# Patient Record
Sex: Male | Born: 1955
Health system: Southern US, Community
[De-identification: ages and names within clinical notes are randomized; demographics above are authoritative.]

## PROBLEM LIST (undated history)

## (undated) DIAGNOSIS — E0590 Thyrotoxicosis, unspecified without thyrotoxic crisis or storm: Secondary | ICD-10-CM

## (undated) HISTORY — PX: FINGER SURGERY: SHX640

## (undated) HISTORY — DX: Thyrotoxicosis, unspecified without thyrotoxic crisis or storm: E05.90

---

## 1988-12-02 HISTORY — PX: BACK SURGERY: SHX140

## 2015-10-23 ENCOUNTER — Other Ambulatory Visit (HOSPITAL_COMMUNITY): Payer: Self-pay | Admitting: Family Medicine

## 2015-10-23 DIAGNOSIS — E0590 Thyrotoxicosis, unspecified without thyrotoxic crisis or storm: Secondary | ICD-10-CM

## 2015-11-01 ENCOUNTER — Encounter (HOSPITAL_COMMUNITY)
Admission: RE | Admit: 2015-11-01 | Discharge: 2015-11-01 | Disposition: A | Discharge status: Home / Self Care | Payer: BLUE CROSS/BLUE SHIELD | Source: Ambulatory Visit | Attending: Family Medicine | Admitting: Family Medicine

## 2015-11-01 DIAGNOSIS — E0590 Thyrotoxicosis, unspecified without thyrotoxic crisis or storm: Secondary | ICD-10-CM

## 2015-11-02 ENCOUNTER — Encounter (HOSPITAL_COMMUNITY)
Admission: RE | Admit: 2015-11-02 | Discharge: 2015-11-02 | Disposition: A | Discharge status: Home / Self Care | Payer: BLUE CROSS/BLUE SHIELD | Source: Ambulatory Visit | Attending: Family Medicine | Admitting: Family Medicine

## 2015-11-02 DIAGNOSIS — E0590 Thyrotoxicosis, unspecified without thyrotoxic crisis or storm: Secondary | ICD-10-CM | POA: Diagnosis present

## 2015-11-02 MED ORDER — SODIUM PERTECHNETATE TC 99M INJECTION
11.0000 | Freq: Once | INTRAVENOUS | Status: AC | PRN
  Administered 2015-11-02: 11 via INTRAVENOUS

## 2015-11-21 ENCOUNTER — Ambulatory Visit (INDEPENDENT_AMBULATORY_CARE_PROVIDER_SITE_OTHER): Payer: BLUE CROSS/BLUE SHIELD | Admitting: Endocrinology

## 2015-11-21 ENCOUNTER — Encounter: Payer: Self-pay | Admitting: Endocrinology

## 2015-11-21 VITALS — BP 170/94 | HR 73 | Temp 98.0°F | Resp 14 | Ht 71.0 in | Wt 216.4 lb

## 2015-11-21 DIAGNOSIS — I10 Essential (primary) hypertension: Secondary | ICD-10-CM | POA: Diagnosis not present

## 2015-11-21 DIAGNOSIS — H9193 Unspecified hearing loss, bilateral: Secondary | ICD-10-CM | POA: Diagnosis not present

## 2015-11-21 DIAGNOSIS — E0590 Thyrotoxicosis, unspecified without thyrotoxic crisis or storm: Secondary | ICD-10-CM

## 2015-11-21 DIAGNOSIS — H9319 Tinnitus, unspecified ear: Secondary | ICD-10-CM

## 2015-11-21 NOTE — Patient Instructions (Signed)
I have ordered for you a treatment pill of radioactive iodine.  Although it is a larger amount of radiation, you will again notice no symptoms from this.  The pill is gone from your body in a few days (during which you should stay away from other people), but takes several months to work.  Therefore, please return here approximately 6-8 weeks after the treatment.  This treatment has been available for many years, and the only known side-effect is an underactive thyroid.  It is possible that i would eventually prescribe for you a thyroid hormone pill, which is very inexpensive.  You don't have to worry about side-effects of this thyroid hormone pill, because it is the same molecule your thyroid makes.   

## 2015-11-21 NOTE — Progress Notes (Signed)
Subjective:    Patient ID: Ivan Lawson, male    DOB: Mar 31, 1956, 59 y.o.   MRN: [id number]  HPI Pt states few mos of slight tremor of the hands, but no assoc weight loss.  he has no prior thyroid hx.  He has never had XRT to the anterior neck, or thyroid surgery.  He has never had thyroid imaging.  He does not consume kelp or any other prescribed or non-prescribed thyroid medication.  He has never been on amiodarone.    No past medical history on file.  No past surgical history on file.  Social History   Social History  . Marital Status: Married    Spouse Name: N/A  . Number of Children: N/A  . Years of Education: N/A   Occupational History  . Not on file.   Social History Main Topics  . Smoking status: Never Smoker   . Smokeless tobacco: Never Used  . Alcohol Use: Not on file  . Drug Use: Not on file  . Sexual Activity: Not on file   Other Topics Concern  . Not on file   Social History Narrative  . No narrative on file    No current outpatient prescriptions on file prior to visit.   No current facility-administered medications on file prior to visit.    No Known Allergies  Family History  Problem Relation Age of Onset  . Thyroid disease Neg Hx     BP 170/94 mmHg  Pulse 73  Temp(Src) 98 F (36.7 C)  Resp 14  Ht 5\' 11"  (1.803 m)  Wt 216 lb 6.4 oz (98.158 kg)  BMI 30.19 kg/m2  SpO2 97%   Review of Systems denies fever, headache, hoarseness, visual loss, palpitations, sob, diarrhea, polyuria, muscle weakness, anxiety, heat intolerance, easy bruising, and rhinorrhea.  He has increased diaphoresis recently.  He has slight acral numbness and tingling.     Objective:   Physical Exam VS: see vs page GEN: no distress HEAD: head: no deformity eyes: no periorbital swelling, no proptosis external nose and ears are normal mouth: no lesion seen NECK: supple, thyroid is not enlarged CHEST WALL: no deformity LUNGS: clear to auscultation BREASTS:  No  gynecomastia CV: reg rate and rhythm, no murmur ABD: abdomen is soft, nontender.  no hepatosplenomegaly.  not distended.  no hernia MUSCULOSKELETAL: muscle bulk and strength are grossly normal.  no obvious joint swelling.  gait is normal and steady EXTEMITIES: no deformity.  no edema PULSES: no carotid bruit NEURO:  cn 2-12 grossly intact.   readily moves all 4's.  sensation is intact to touch on all 4's SKIN:  Normal texture and temperature.  No rash or suspicious lesion is visible.   NODES:  None palpable at the neck PSYCH: alert, well-oriented.  Does not appear anxious nor depressed.  outside test results are reviewed: TSH is <0.01 Free T4=1.28  Radiol: thyroid nuc med: normal, with diffuse uptake.  I have reviewed outside records, and summarized: Pt was noted to have hyperthyroidism, and referred here.     Assessment & Plan:  Hyperthyroidism, new, due to Grave's Dz. We discussed rx options.  She chooses I-131 rx.    Patient is advised the following: Patient Instructions  I have ordered for you a treatment pill of radioactive iodine.  Although it is a larger amount of radiation, you will again notice no symptoms from this.  The pill is gone from your body in a few days (during which you should stay  away from other people), but takes several months to work.  Therefore, please return here approximately 6-8 weeks after the treatment.  This treatment has been available for many years, and the only known side-effect is an underactive thyroid.  It is possible that i would eventually prescribe for you a thyroid hormone pill, which is very inexpensive.  You don't have to worry about side-effects of this thyroid hormone pill, because it is the same molecule your thyroid makes.

## 2015-11-22 DIAGNOSIS — H9319 Tinnitus, unspecified ear: Secondary | ICD-10-CM | POA: Insufficient documentation

## 2015-11-22 DIAGNOSIS — H9193 Unspecified hearing loss, bilateral: Secondary | ICD-10-CM | POA: Insufficient documentation

## 2015-11-22 DIAGNOSIS — I10 Essential (primary) hypertension: Secondary | ICD-10-CM | POA: Insufficient documentation

## 2015-11-23 ENCOUNTER — Encounter: Payer: Self-pay | Admitting: Endocrinology

## 2015-12-14 ENCOUNTER — Encounter (HOSPITAL_COMMUNITY)
Admission: RE | Admit: 2015-12-14 | Discharge: 2015-12-14 | Disposition: A | Discharge status: Home / Self Care | Payer: BLUE CROSS/BLUE SHIELD | Source: Ambulatory Visit | Attending: Endocrinology | Admitting: Endocrinology

## 2015-12-14 DIAGNOSIS — E0590 Thyrotoxicosis, unspecified without thyrotoxic crisis or storm: Secondary | ICD-10-CM | POA: Insufficient documentation

## 2015-12-14 MED ORDER — SODIUM IODIDE I 131 CAPSULE
33.9000 | Freq: Once | INTRAVENOUS | Status: AC | PRN
  Administered 2015-12-14: 33.9 via ORAL

## 2016-01-26 ENCOUNTER — Encounter: Payer: Self-pay | Admitting: Endocrinology

## 2016-01-26 ENCOUNTER — Ambulatory Visit (INDEPENDENT_AMBULATORY_CARE_PROVIDER_SITE_OTHER): Payer: BLUE CROSS/BLUE SHIELD | Admitting: Endocrinology

## 2016-01-26 VITALS — BP 156/82 | HR 64 | Temp 98.4°F | Ht 71.0 in | Wt 214.8 lb

## 2016-01-26 DIAGNOSIS — E0590 Thyrotoxicosis, unspecified without thyrotoxic crisis or storm: Secondary | ICD-10-CM | POA: Diagnosis not present

## 2016-01-26 LAB — TSH: TSH: 0.1 u[IU]/mL — AB (ref 0.35–4.50)

## 2016-01-26 LAB — T4, FREE: FREE T4: 2.88 ng/dL — AB (ref 0.60–1.60)

## 2016-01-26 NOTE — Progress Notes (Signed)
Pre visit review using our clinic review tool, if applicable. No additional management support is needed unless otherwise documented below in the visit note. 

## 2016-01-26 NOTE — Patient Instructions (Addendum)
blood tests are requested for you today.  We'll let you know about the results.  Please come back for a follow-up appointment in 1 month.  

## 2016-01-26 NOTE — Progress Notes (Signed)
   Subjective:    Patient ID: Ivan Lawson, male    DOB: May 29, 1956, 60 y.o.   MRN: [id number]  HPI Pt returns for f/u of hyperthyroidism (due to Grave's dz; dx'ed 2016; he had I-131 rx in Jan of 2017).  Since the I-131 rx, tremor is improved, but persists.  No past medical history on file.  No past surgical history on file.  Social History   Social History  . Marital Status: Married    Spouse Name: N/A  . Number of Children: N/A  . Years of Education: N/A   Occupational History  . Not on file.   Social History Main Topics  . Smoking status: Never Smoker   . Smokeless tobacco: Never Used  . Alcohol Use: Not on file  . Drug Use: Not on file  . Sexual Activity: Not on file   Other Topics Concern  . Not on file   Social History Narrative    Current Outpatient Prescriptions on File Prior to Visit  Medication Sig Dispense Refill  . amLODipine (NORVASC) 10 MG tablet   3  . bisoprolol-hydrochlorothiazide (ZIAC) 5-6.25 MG tablet   0   No current facility-administered medications on file prior to visit.    No Known Allergies  Family History  Problem Relation Age of Onset  . Thyroid disease Neg Hx     BP 156/82 mmHg  Pulse 64  Temp(Src) 98.4 F (36.9 C) (Oral)  Ht  (1.803 m)  Wt 214 lb 12.8 oz (97.433 kg)  BMI 29.97 kg/m2  SpO2 99%  Review of Systems Denies edema    Objective:   Physical Exam VITAL SIGNS:  See vs page GENERAL: no distress NECK: There is no palpable thyroid enlargement.  No thyroid nodule is palpable.  No palpable lymphadenopathy at the anterior neck.   Lab Results  Component Value Date   TSH 0.10* 01/26/2016       Assessment & Plan:  Hyperthyroidism: not better yet  Patient is advised the following: Patient Instructions  blood tests are requested for you today.  We'll let you know about the results. Please come back for a follow-up appointment in 1 month.

## 2016-01-29 ENCOUNTER — Telehealth: Payer: Self-pay | Admitting: Endocrinology

## 2016-01-29 NOTE — Telephone Encounter (Signed)
Pt advised of recent blood tests and voiced understanding.

## 2016-01-29 NOTE — Telephone Encounter (Signed)
Pt said he was calling for some lab results

## 2016-02-23 ENCOUNTER — Encounter: Payer: Self-pay | Admitting: Endocrinology

## 2016-02-23 ENCOUNTER — Ambulatory Visit (INDEPENDENT_AMBULATORY_CARE_PROVIDER_SITE_OTHER): Payer: BLUE CROSS/BLUE SHIELD | Admitting: Endocrinology

## 2016-02-23 VITALS — BP 140/86 | HR 71 | Temp 98.1°F | Ht 71.0 in | Wt 215.0 lb

## 2016-02-23 DIAGNOSIS — E0590 Thyrotoxicosis, unspecified without thyrotoxic crisis or storm: Secondary | ICD-10-CM | POA: Diagnosis not present

## 2016-02-23 LAB — TSH: TSH: 0.24 u[IU]/mL — ABNORMAL LOW (ref 0.35–4.50)

## 2016-02-23 LAB — T4, FREE: Free T4: 0.74 ng/dL (ref 0.60–1.60)

## 2016-02-23 MED ORDER — LEVOTHYROXINE SODIUM 100 MCG PO TABS: 100.0000 ug | ORAL_TABLET | Freq: Every day | ORAL | Status: DC

## 2016-02-23 NOTE — Patient Instructions (Signed)
blood tests are requested for you today.  We'll let you know about the results.  Please come back for a follow-up appointment in 1 month.  

## 2016-02-23 NOTE — Progress Notes (Signed)
   Subjective:    Patient ID: Ivan Lawson, male    DOB: 06-04-1956, 60 y.o.   MRN: [id number]  HPI Pt returns for f/u of hyperthyroidism (due to Grave's dz; dx'ed 2016; he had I-131 rx in Jan of 2017).  Since the I-131 rx, he says he can't tell if he feels better, due to sciatica.   No past medical history on file.  No past surgical history on file.  Social History   Social History  . Marital Status: Married    Spouse Name: N/A  . Number of Children: N/A  . Years of Education: N/A   Occupational History  . Not on file.   Social History Main Topics  . Smoking status: Never Smoker   . Smokeless tobacco: Never Used  . Alcohol Use: Not on file  . Drug Use: Not on file  . Sexual Activity: Not on file   Other Topics Concern  . Not on file   Social History Narrative    Current Outpatient Prescriptions on File Prior to Visit  Medication Sig Dispense Refill  . amLODipine (NORVASC) 10 MG tablet   3  . bisoprolol-hydrochlorothiazide (ZIAC) 5-6.25 MG tablet   0   No current facility-administered medications on file prior to visit.    No Known Allergies  Family History  Problem Relation Age of Onset  . Thyroid disease Neg Hx     BP 140/86 mmHg  Pulse 71  Temp(Src) 98.1 F (36.7 C) (Oral)  Ht 5\' 11"  (1.803 m)  Wt 215 lb (97.523 kg)  BMI 30.00 kg/m2  SpO2 98%  Review of Systems He has insomnia    Objective:   Physical Exam VITAL SIGNS:  See vs page GENERAL: no distress NECK: There is no palpable thyroid enlargement.  No thyroid nodule is palpable.  No palpable lymphadenopathy at the anterior neck.    Lab Results  Component Value Date   TSH 0.24* 02/23/2016      Assessment & Plan:  Hyperthyroidism: better.  He'll develop hypothyroidism soon.   Patient is advised the following:  Addendum: i have sent a prescription to your pharmacy, for synthroid

## 2016-03-22 ENCOUNTER — Ambulatory Visit (INDEPENDENT_AMBULATORY_CARE_PROVIDER_SITE_OTHER): Payer: BLUE CROSS/BLUE SHIELD | Admitting: Endocrinology

## 2016-03-22 ENCOUNTER — Encounter: Payer: Self-pay | Admitting: Endocrinology

## 2016-03-22 VITALS — BP 130/88 | HR 67 | Temp 98.3°F | Ht 71.0 in | Wt 213.0 lb

## 2016-03-22 DIAGNOSIS — E0590 Thyrotoxicosis, unspecified without thyrotoxic crisis or storm: Secondary | ICD-10-CM

## 2016-03-22 DIAGNOSIS — R200 Anesthesia of skin: Secondary | ICD-10-CM

## 2016-03-22 LAB — TSH: TSH: 0.04 u[IU]/mL — AB (ref 0.35–4.50)

## 2016-03-22 LAB — VITAMIN B12: Vitamin B-12: 180 pg/mL — ABNORMAL LOW (ref 211–911)

## 2016-03-22 LAB — T4, FREE: FREE T4: 1.4 ng/dL (ref 0.60–1.60)

## 2016-03-22 NOTE — Patient Instructions (Addendum)
blood tests are requested for you today.  We'll let you know about the results.  Please come back for a follow-up appointment in 6 weeks.  

## 2016-03-22 NOTE — Progress Notes (Signed)
   Subjective:    Patient ID: Ivan Lawson, male    DOB: 11-03-1956, 60 y.o.   MRN: [id number]  HPI Pt returns for f/u of hyperthyroidism (due to Grave's dz; dx'ed 2016; he had I-131 rx in Jan of 2017).  Since on the synthroid, pt states he feels well in general, except for slight numbness of the feet.   No past medical history on file.  No past surgical history on file.  Social History   Social History  . Marital Status: Married    Spouse Name: N/A  . Number of Children: N/A  . Years of Education: N/A   Occupational History  . Not on file.   Social History Main Topics  . Smoking status: Never Smoker   . Smokeless tobacco: Never Used  . Alcohol Use: Not on file  . Drug Use: Not on file  . Sexual Activity: Not on file   Other Topics Concern  . Not on file   Social History Narrative    Current Outpatient Prescriptions on File Prior to Visit  Medication Sig Dispense Refill  . amLODipine (NORVASC) 10 MG tablet   3  . bisoprolol-hydrochlorothiazide (ZIAC) 5-6.25 MG tablet   0   No current facility-administered medications on file prior to visit.    No Known Allergies  Family History  Problem Relation Age of Onset  . Thyroid disease Neg Hx     BP 130/88 mmHg  Pulse 67  Temp(Src) 98.3 F (36.8 C) (Oral)  Ht 5\' 11"  (1.803 m)  Wt 213 lb (96.616 kg)  BMI 29.72 kg/m2  SpO2 95%  Review of Systems No weight change    Objective:   Physical Exam VITAL SIGNS:  See vs page GENERAL: no distress NECK: There is no palpable thyroid enlargement.  No thyroid nodule is palpable.  No palpable lymphadenopathy at the anterior neck.  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  Patient is advised the following: Patient Instructions  blood tests are requested for you today.  We'll let you know about the results.  Please come back for a follow-up appointment in 6 weeks.     addendum: i  have sent a prescription to your pharmacy, to reduce the synthroid.

## 2016-03-23 ENCOUNTER — Encounter: Payer: Self-pay | Admitting: Endocrinology

## 2016-03-23 MED ORDER — LEVOTHYROXINE SODIUM 50 MCG PO TABS: 50.0000 ug | ORAL_TABLET | Freq: Every day | ORAL | Status: DC

## 2016-03-26 ENCOUNTER — Ambulatory Visit (INDEPENDENT_AMBULATORY_CARE_PROVIDER_SITE_OTHER): Payer: BLUE CROSS/BLUE SHIELD

## 2016-03-26 DIAGNOSIS — E538 Deficiency of other specified B group vitamins: Secondary | ICD-10-CM | POA: Diagnosis not present

## 2016-03-26 MED ORDER — CYANOCOBALAMIN 1000 MCG/ML IJ SOLN
1000.0000 ug | Freq: Once | INTRAMUSCULAR | Status: AC
  Administered 2016-03-26: 1000 ug via INTRAMUSCULAR

## 2016-04-25 ENCOUNTER — Ambulatory Visit (INDEPENDENT_AMBULATORY_CARE_PROVIDER_SITE_OTHER): Payer: BLUE CROSS/BLUE SHIELD | Admitting: *Deleted

## 2016-04-25 DIAGNOSIS — D531 Other megaloblastic anemias, not elsewhere classified: Secondary | ICD-10-CM | POA: Diagnosis not present

## 2016-04-25 MED ORDER — CYANOCOBALAMIN 1000 MCG/ML IJ SOLN: 1000.0000 ug | Freq: Once | INTRAMUSCULAR | Status: DC

## 2016-05-03 ENCOUNTER — Ambulatory Visit (INDEPENDENT_AMBULATORY_CARE_PROVIDER_SITE_OTHER): Payer: BLUE CROSS/BLUE SHIELD | Admitting: Endocrinology

## 2016-05-03 ENCOUNTER — Encounter: Payer: Self-pay | Admitting: Endocrinology

## 2016-05-03 VITALS — BP 144/86 | HR 63 | Temp 98.4°F | Ht 71.0 in | Wt 220.0 lb

## 2016-05-03 DIAGNOSIS — R972 Elevated prostate specific antigen [PSA]: Secondary | ICD-10-CM | POA: Diagnosis not present

## 2016-05-03 DIAGNOSIS — E0590 Thyrotoxicosis, unspecified without thyrotoxic crisis or storm: Secondary | ICD-10-CM

## 2016-05-03 LAB — TSH: TSH: 26.99 u[IU]/mL — AB (ref 0.35–4.50)

## 2016-05-03 LAB — T4, FREE: FREE T4: 0.41 ng/dL — AB (ref 0.60–1.60)

## 2016-05-03 NOTE — Patient Instructions (Addendum)
blood tests are requested for you today.  We'll let you know about the results.   Please come back for a follow-up appointment in 3 months.   

## 2016-05-03 NOTE — Progress Notes (Signed)
   Subjective:    Patient ID: Ivan Lawson, male    DOB: June 30, 1956, 60 y.o.   MRN: [id number]  HPI Pt returns for f/u of hyperthyroidism (due to Grave's dz; dx'ed 2016; he had I-131 rx in Jan of 2017).  Since on the synthroid, pt states he feels well in general.   Past Medical History  Diagnosis Date  . Hyperthyroidism     No past surgical history on file.  Social History   Social History  . Marital Status: Married    Spouse Name: N/A  . Number of Children: N/A  . Years of Education: N/A   Occupational History  . Not on file.   Social History Main Topics  . Smoking status: Never Smoker   . Smokeless tobacco: Never Used  . Alcohol Use: Not on file  . Drug Use: Not on file  . Sexual Activity: Not on file   Other Topics Concern  . Not on file   Social History Narrative    Current Outpatient Prescriptions on File Prior to Visit  Medication Sig Dispense Refill  . amLODipine (NORVASC) 10 MG tablet   3  . bisoprolol-hydrochlorothiazide (ZIAC) 5-6.25 MG tablet   0  . cyanocobalamin (,VITAMIN B-12,) 1000 MCG/ML injection Inject 1 mL (1,000 mcg total) into the muscle once. 1 mL 0  . levothyroxine (SYNTHROID, LEVOTHROID) 50 MCG tablet Take 1 tablet (50 mcg total) by mouth daily. 30 tablet 3   No current facility-administered medications on file prior to visit.    No Known Allergies  Family History  Problem Relation Age of Onset  . Thyroid disease Neg Hx     BP 144/86 mmHg  Pulse 63  Temp(Src) 98.4 F (36.9 C) (Oral)  Ht 5\' 11"  (1.803 m)  Wt 220 lb (99.791 kg)  BMI 30.70 kg/m2  SpO2 96%  Review of Systems He has gained weight.        Objective:   Physical Exam VITAL SIGNS:  See vs page.  GENERAL: no distress.  NECK: There is no palpable thyroid enlargement.  No thyroid nodule is palpable.  No palpable lymphadenopathy at the anterior neck.    Lab Results  Component Value Date   TSH 26.99* 05/03/2016      Assessment & Plan:  Post-RAI hypothyroidism:  she needs increased rx.  Increase synthroid to 112/d  Patient is advised the following: Patient Instructions  blood tests are requested for you today.  We'll let you know about the results.  Please come back for a follow-up appointment in 3 months.     Romero BellingELLISON, Tashan Kreitzer, MD

## 2016-05-04 MED ORDER — LEVOTHYROXINE SODIUM 112 MCG PO TABS: 112.0000 ug | ORAL_TABLET | Freq: Every day | ORAL | Status: DC

## 2016-05-28 ENCOUNTER — Ambulatory Visit (INDEPENDENT_AMBULATORY_CARE_PROVIDER_SITE_OTHER): Payer: BLUE CROSS/BLUE SHIELD

## 2016-05-28 DIAGNOSIS — E538 Deficiency of other specified B group vitamins: Secondary | ICD-10-CM | POA: Diagnosis not present

## 2016-05-28 MED ORDER — CYANOCOBALAMIN 1000 MCG/ML IJ SOLN
1000.0000 ug | Freq: Once | INTRAMUSCULAR | Status: AC
  Administered 2016-05-28: 1000 ug via INTRAMUSCULAR

## 2016-06-27 ENCOUNTER — Ambulatory Visit: Payer: BLUE CROSS/BLUE SHIELD

## 2016-07-01 DIAGNOSIS — R972 Elevated prostate specific antigen [PSA]: Secondary | ICD-10-CM | POA: Diagnosis not present

## 2016-07-31 ENCOUNTER — Ambulatory Visit: Payer: BLUE CROSS/BLUE SHIELD

## 2016-08-06 ENCOUNTER — Ambulatory Visit (INDEPENDENT_AMBULATORY_CARE_PROVIDER_SITE_OTHER): Payer: BLUE CROSS/BLUE SHIELD

## 2016-08-06 DIAGNOSIS — E538 Deficiency of other specified B group vitamins: Secondary | ICD-10-CM | POA: Diagnosis not present

## 2016-08-06 MED ORDER — CYANOCOBALAMIN 1000 MCG/ML IJ SOLN
1000.0000 ug | Freq: Once | INTRAMUSCULAR | Status: AC
  Administered 2016-08-06: 1000 ug via INTRAMUSCULAR

## 2016-08-07 ENCOUNTER — Ambulatory Visit: Payer: BLUE CROSS/BLUE SHIELD | Admitting: Endocrinology

## 2016-08-07 DIAGNOSIS — Z0289 Encounter for other administrative examinations: Secondary | ICD-10-CM

## 2016-09-05 ENCOUNTER — Ambulatory Visit (INDEPENDENT_AMBULATORY_CARE_PROVIDER_SITE_OTHER): Payer: BLUE CROSS/BLUE SHIELD

## 2016-09-05 DIAGNOSIS — E538 Deficiency of other specified B group vitamins: Secondary | ICD-10-CM

## 2016-09-05 MED ORDER — CYANOCOBALAMIN 1000 MCG/ML IJ SOLN
1000.0000 ug | Freq: Once | INTRAMUSCULAR | Status: AC
  Administered 2016-09-05: 1000 ug via INTRAMUSCULAR

## 2016-09-25 ENCOUNTER — Encounter: Payer: Self-pay | Admitting: Endocrinology

## 2016-09-25 ENCOUNTER — Ambulatory Visit (INDEPENDENT_AMBULATORY_CARE_PROVIDER_SITE_OTHER): Payer: BLUE CROSS/BLUE SHIELD | Admitting: Endocrinology

## 2016-09-25 DIAGNOSIS — E890 Postprocedural hypothyroidism: Secondary | ICD-10-CM | POA: Diagnosis not present

## 2016-09-25 LAB — TSH: TSH: 5.6 u[IU]/mL — AB (ref 0.35–4.50)

## 2016-09-25 MED ORDER — LEVOTHYROXINE SODIUM 125 MCG PO TABS: 125.0000 ug | ORAL_TABLET | Freq: Every day | ORAL | 2 refills | Status: DC

## 2016-09-25 NOTE — Progress Notes (Signed)
   Subjective:    Patient ID: Ivan Lawson, male    DOB: 12-30-1955, 60 y.o.   MRN: [id number]  HPI Pt returns for f/u of post-RAI  hypothyroidism (he had RAI in Jan of 2017, he had RAI for hyperthyroidism, due to Grave's Dz; in mid-2017, he was started on synthroid, for hypothyroidism).  pt states he feels well in general.  Past Medical History:  Diagnosis Date  . Hyperthyroidism     No past surgical history on file.  Social History   Social History  . Marital status: Married    Spouse name: N/A  . Number of children: N/A  . Years of education: N/A   Occupational History  . Not on file.   Social History Main Topics  . Smoking status: Never Smoker  . Smokeless tobacco: Never Used  . Alcohol use Not on file  . Drug use: Unknown  . Sexual activity: Not on file   Other Topics Concern  . Not on file   Social History Narrative  . No narrative on file    Current Outpatient Prescriptions on File Prior to Visit  Medication Sig Dispense Refill  . amLODipine (NORVASC) 10 MG tablet   3  . bisoprolol-hydrochlorothiazide (ZIAC) 5-6.25 MG tablet   0  . cyanocobalamin (,VITAMIN B-12,) 1000 MCG/ML injection Inject 1 mL (1,000 mcg total) into the muscle once. 1 mL 0   No current facility-administered medications on file prior to visit.     No Known Allergies  Family History  Problem Relation Age of Onset  . Thyroid disease Neg Hx     BP 134/86   Pulse 66   Ht 5\' 11"  (1.803 m)   SpO2 98%   Review of Systems No recent weight change.      Objective:   Physical Exam VITAL SIGNS:  See vs page GENERAL: no distress NECK: There is no palpable thyroid enlargement.  No thyroid nodule is palpable.  No palpable lymphadenopathy at the anterior neck.  Lab Results  Component Value Date   TSH 5.60 (H) 09/25/2016      Assessment & Plan:  post-RAI  Hypothyroidism: he needs increased rx I have sent a prescription to your pharmacy, to increase synthroid Please come back for a  follow-up appointment in 3 months.

## 2016-09-25 NOTE — Patient Instructions (Addendum)
A thyroid blood test is requested for you today.  We'll let you know about the results.  Based on the results, we'll probably need to increase the medication Please come back for a follow-up appointment in 3 months.

## 2016-10-08 ENCOUNTER — Ambulatory Visit (INDEPENDENT_AMBULATORY_CARE_PROVIDER_SITE_OTHER): Payer: BLUE CROSS/BLUE SHIELD

## 2016-10-08 DIAGNOSIS — E538 Deficiency of other specified B group vitamins: Secondary | ICD-10-CM

## 2016-10-08 MED ORDER — CYANOCOBALAMIN 1000 MCG/ML IJ SOLN
1000.0000 ug | Freq: Once | INTRAMUSCULAR | Status: AC
  Administered 2016-10-08: 1000 ug via INTRAMUSCULAR

## 2016-10-18 DIAGNOSIS — E890 Postprocedural hypothyroidism: Secondary | ICD-10-CM | POA: Diagnosis not present

## 2016-10-18 DIAGNOSIS — Z1211 Encounter for screening for malignant neoplasm of colon: Secondary | ICD-10-CM | POA: Diagnosis not present

## 2016-10-18 DIAGNOSIS — R7309 Other abnormal glucose: Secondary | ICD-10-CM | POA: Diagnosis not present

## 2016-10-18 DIAGNOSIS — I10 Essential (primary) hypertension: Secondary | ICD-10-CM | POA: Diagnosis not present

## 2016-10-22 DIAGNOSIS — R7301 Impaired fasting glucose: Secondary | ICD-10-CM | POA: Diagnosis not present

## 2016-11-07 ENCOUNTER — Ambulatory Visit (INDEPENDENT_AMBULATORY_CARE_PROVIDER_SITE_OTHER): Payer: BLUE CROSS/BLUE SHIELD

## 2016-11-07 DIAGNOSIS — E538 Deficiency of other specified B group vitamins: Secondary | ICD-10-CM | POA: Diagnosis not present

## 2016-11-07 MED ORDER — CYANOCOBALAMIN 1000 MCG/ML IJ SOLN
1000.0000 ug | Freq: Once | INTRAMUSCULAR | Status: AC
  Administered 2016-11-07: 1000 ug via INTRAMUSCULAR

## 2016-12-10 ENCOUNTER — Ambulatory Visit (INDEPENDENT_AMBULATORY_CARE_PROVIDER_SITE_OTHER): Payer: BLUE CROSS/BLUE SHIELD

## 2016-12-10 DIAGNOSIS — E538 Deficiency of other specified B group vitamins: Secondary | ICD-10-CM | POA: Diagnosis not present

## 2016-12-10 MED ORDER — CYANOCOBALAMIN 1000 MCG/ML IJ SOLN
1000.0000 ug | Freq: Once | INTRAMUSCULAR | Status: AC
  Administered 2016-12-10: 1000 ug via INTRAMUSCULAR

## 2016-12-22 NOTE — Progress Notes (Signed)
   Subjective:    Patient ID: Ivan Lawson, male    DOB: Jul 16, 1956, 61 y.o.   MRN: [id number]  HPI Pt returns for f/u of post-RAI  hypothyroidism (he had RAI in Jan of 2017, he had RAI for hyperthyroidism, due to Grave's Dz; in mid-2017, he was started on synthroid, for hypothyroidism).  pt states he feels well in general.  Past Medical History:  Diagnosis Date  . Hyperthyroidism     No past surgical history on file.  Social History   Social History  . Marital status: Married    Spouse name: N/A  . Number of children: N/A  . Years of education: N/A   Occupational History  . Not on file.   Social History Main Topics  . Smoking status: Never Smoker  . Smokeless tobacco: Never Used  . Alcohol use Not on file  . Drug use: Unknown  . Sexual activity: Not on file   Other Topics Concern  . Not on file   Social History Narrative  . No narrative on file    Current Outpatient Prescriptions on File Prior to Visit  Medication Sig Dispense Refill  . amLODipine (NORVASC) 10 MG tablet   3  . bisoprolol-hydrochlorothiazide (ZIAC) 5-6.25 MG tablet   0  . cyanocobalamin (,VITAMIN B-12,) 1000 MCG/ML injection Inject 1 mL (1,000 mcg total) into the muscle once. 1 mL 0   No current facility-administered medications on file prior to visit.     No Known Allergies  Family History  Problem Relation Age of Onset  . Thyroid disease Neg Hx     BP 140/84   Pulse 67   Ht 5\' 11"  (1.803 m)   Wt 227 lb (103 kg)   SpO2 97%   BMI 31.66 kg/m    Review of Systems No weight change    Objective:   Physical Exam VITAL SIGNS:  See vs page GENERAL: no distress NECK: There is no palpable thyroid enlargement.  No thyroid nodule is palpable.  No palpable lymphadenopathy at the anterior neck.  Lab Results  Component Value Date   TSH 4.36 12/26/2016      Assessment & Plan:  Hypothyroidism: well-replaced. Please continue the same medication.

## 2016-12-26 ENCOUNTER — Ambulatory Visit (INDEPENDENT_AMBULATORY_CARE_PROVIDER_SITE_OTHER): Payer: BLUE CROSS/BLUE SHIELD | Admitting: Endocrinology

## 2016-12-26 VITALS — BP 140/84 | HR 67 | Ht 71.0 in | Wt 227.0 lb

## 2016-12-26 DIAGNOSIS — E890 Postprocedural hypothyroidism: Secondary | ICD-10-CM

## 2016-12-26 LAB — TSH: TSH: 4.36 u[IU]/mL (ref 0.35–4.50)

## 2016-12-26 NOTE — Patient Instructions (Signed)
A thyroid blood test is requested for you today.  We'll let you know about the results.   I would be happy to see you back here as needed.    

## 2016-12-29 MED ORDER — LEVOTHYROXINE SODIUM 125 MCG PO TABS: 125.0000 ug | ORAL_TABLET | Freq: Every day | ORAL | 2 refills | Status: DC

## 2017-01-14 ENCOUNTER — Ambulatory Visit (INDEPENDENT_AMBULATORY_CARE_PROVIDER_SITE_OTHER): Payer: BLUE CROSS/BLUE SHIELD

## 2017-01-14 DIAGNOSIS — E538 Deficiency of other specified B group vitamins: Secondary | ICD-10-CM | POA: Diagnosis not present

## 2017-01-14 MED ORDER — CYANOCOBALAMIN 1000 MCG/ML IJ SOLN
1000.0000 ug | Freq: Once | INTRAMUSCULAR | Status: AC
  Administered 2017-01-14: 1000 ug via INTRAMUSCULAR

## 2017-02-11 ENCOUNTER — Ambulatory Visit (INDEPENDENT_AMBULATORY_CARE_PROVIDER_SITE_OTHER): Payer: BLUE CROSS/BLUE SHIELD

## 2017-02-11 DIAGNOSIS — E538 Deficiency of other specified B group vitamins: Secondary | ICD-10-CM | POA: Diagnosis not present

## 2017-02-11 MED ORDER — CYANOCOBALAMIN 1000 MCG/ML IJ SOLN
1000.0000 ug | Freq: Once | INTRAMUSCULAR | Status: AC
  Administered 2017-02-11: 1000 ug via INTRAMUSCULAR

## 2017-03-13 ENCOUNTER — Ambulatory Visit (INDEPENDENT_AMBULATORY_CARE_PROVIDER_SITE_OTHER): Payer: BLUE CROSS/BLUE SHIELD

## 2017-03-13 DIAGNOSIS — E538 Deficiency of other specified B group vitamins: Secondary | ICD-10-CM | POA: Diagnosis not present

## 2017-03-13 MED ORDER — CYANOCOBALAMIN 1000 MCG/ML IJ SOLN
1000.0000 ug | Freq: Once | INTRAMUSCULAR | Status: AC
  Administered 2017-03-13: 1000 ug via INTRAMUSCULAR

## 2017-04-15 ENCOUNTER — Ambulatory Visit (INDEPENDENT_AMBULATORY_CARE_PROVIDER_SITE_OTHER): Payer: BLUE CROSS/BLUE SHIELD

## 2017-04-15 DIAGNOSIS — E538 Deficiency of other specified B group vitamins: Secondary | ICD-10-CM | POA: Diagnosis not present

## 2017-04-15 MED ORDER — CYANOCOBALAMIN 1000 MCG/ML IJ SOLN
1000.0000 ug | Freq: Once | INTRAMUSCULAR | Status: AC
  Administered 2017-04-15: 1000 ug via INTRAMUSCULAR

## 2017-05-21 ENCOUNTER — Ambulatory Visit (INDEPENDENT_AMBULATORY_CARE_PROVIDER_SITE_OTHER): Payer: BLUE CROSS/BLUE SHIELD

## 2017-05-21 DIAGNOSIS — E538 Deficiency of other specified B group vitamins: Secondary | ICD-10-CM

## 2017-05-21 MED ORDER — CYANOCOBALAMIN 1000 MCG/ML IJ SOLN
1000.0000 ug | Freq: Once | INTRAMUSCULAR | Status: AC
  Administered 2017-05-21: 1000 ug via INTRAMUSCULAR

## 2017-06-19 ENCOUNTER — Ambulatory Visit (INDEPENDENT_AMBULATORY_CARE_PROVIDER_SITE_OTHER): Payer: BLUE CROSS/BLUE SHIELD

## 2017-06-19 DIAGNOSIS — E538 Deficiency of other specified B group vitamins: Secondary | ICD-10-CM | POA: Diagnosis not present

## 2017-06-19 MED ORDER — CYANOCOBALAMIN 1000 MCG/ML IJ SOLN
1000.0000 ug | Freq: Once | INTRAMUSCULAR | Status: AC
  Administered 2017-06-19: 1000 ug via INTRAMUSCULAR

## 2017-07-22 ENCOUNTER — Ambulatory Visit (INDEPENDENT_AMBULATORY_CARE_PROVIDER_SITE_OTHER): Payer: BLUE CROSS/BLUE SHIELD

## 2017-07-22 DIAGNOSIS — E538 Deficiency of other specified B group vitamins: Secondary | ICD-10-CM | POA: Diagnosis not present

## 2017-07-22 MED ORDER — CYANOCOBALAMIN 1000 MCG/ML IJ SOLN
1000.0000 ug | Freq: Once | INTRAMUSCULAR | Status: AC
  Administered 2017-07-22: 1000 ug via INTRAMUSCULAR

## 2017-08-21 ENCOUNTER — Ambulatory Visit: Payer: BLUE CROSS/BLUE SHIELD

## 2017-08-27 ENCOUNTER — Ambulatory Visit (INDEPENDENT_AMBULATORY_CARE_PROVIDER_SITE_OTHER): Payer: BLUE CROSS/BLUE SHIELD | Admitting: Endocrinology

## 2017-08-27 DIAGNOSIS — E538 Deficiency of other specified B group vitamins: Secondary | ICD-10-CM

## 2017-08-27 MED ORDER — CYANOCOBALAMIN 1000 MCG/ML IJ SOLN
1000.0000 ug | Freq: Once | INTRAMUSCULAR | Status: AC
  Administered 2017-08-27: 1000 ug via INTRAMUSCULAR

## 2017-09-08 DIAGNOSIS — Z1211 Encounter for screening for malignant neoplasm of colon: Secondary | ICD-10-CM | POA: Diagnosis not present

## 2017-09-08 DIAGNOSIS — D126 Benign neoplasm of colon, unspecified: Secondary | ICD-10-CM | POA: Diagnosis not present

## 2017-09-08 DIAGNOSIS — K635 Polyp of colon: Secondary | ICD-10-CM | POA: Diagnosis not present

## 2017-09-11 DIAGNOSIS — D126 Benign neoplasm of colon, unspecified: Secondary | ICD-10-CM | POA: Diagnosis not present

## 2017-09-11 DIAGNOSIS — Z1211 Encounter for screening for malignant neoplasm of colon: Secondary | ICD-10-CM | POA: Diagnosis not present

## 2017-09-11 DIAGNOSIS — K635 Polyp of colon: Secondary | ICD-10-CM | POA: Diagnosis not present

## 2017-09-30 ENCOUNTER — Ambulatory Visit (INDEPENDENT_AMBULATORY_CARE_PROVIDER_SITE_OTHER): Payer: BLUE CROSS/BLUE SHIELD

## 2017-09-30 DIAGNOSIS — E538 Deficiency of other specified B group vitamins: Secondary | ICD-10-CM

## 2017-09-30 MED ORDER — CYANOCOBALAMIN 1000 MCG/ML IJ SOLN
1000.0000 ug | Freq: Once | INTRAMUSCULAR | Status: AC
  Administered 2017-09-30: 1000 ug via INTRAMUSCULAR

## 2017-10-20 DIAGNOSIS — I10 Essential (primary) hypertension: Secondary | ICD-10-CM | POA: Diagnosis not present

## 2017-10-20 DIAGNOSIS — E890 Postprocedural hypothyroidism: Secondary | ICD-10-CM | POA: Diagnosis not present

## 2017-10-20 DIAGNOSIS — E0500 Thyrotoxicosis with diffuse goiter without thyrotoxic crisis or storm: Secondary | ICD-10-CM | POA: Diagnosis not present

## 2017-10-20 DIAGNOSIS — R7309 Other abnormal glucose: Secondary | ICD-10-CM | POA: Diagnosis not present

## 2017-10-20 DIAGNOSIS — E538 Deficiency of other specified B group vitamins: Secondary | ICD-10-CM | POA: Diagnosis not present

## 2017-10-30 ENCOUNTER — Ambulatory Visit: Payer: BLUE CROSS/BLUE SHIELD

## 2017-11-04 ENCOUNTER — Ambulatory Visit: Payer: BLUE CROSS/BLUE SHIELD

## 2017-11-12 ENCOUNTER — Ambulatory Visit (INDEPENDENT_AMBULATORY_CARE_PROVIDER_SITE_OTHER): Payer: BLUE CROSS/BLUE SHIELD

## 2017-11-12 DIAGNOSIS — E538 Deficiency of other specified B group vitamins: Secondary | ICD-10-CM | POA: Diagnosis not present

## 2017-11-12 MED ORDER — CYANOCOBALAMIN 1000 MCG/ML IJ SOLN
1000.0000 ug | Freq: Once | INTRAMUSCULAR | Status: AC
  Administered 2017-11-12: 1000 ug via INTRAMUSCULAR

## 2017-12-03 ENCOUNTER — Encounter: Payer: Self-pay | Admitting: Endocrinology

## 2017-12-03 NOTE — Progress Notes (Signed)
B-12 f/u scheduled.

## 2017-12-10 ENCOUNTER — Ambulatory Visit (INDEPENDENT_AMBULATORY_CARE_PROVIDER_SITE_OTHER): Payer: BLUE CROSS/BLUE SHIELD

## 2017-12-10 DIAGNOSIS — E538 Deficiency of other specified B group vitamins: Secondary | ICD-10-CM | POA: Diagnosis not present

## 2017-12-10 MED ORDER — CYANOCOBALAMIN 1000 MCG/ML IJ SOLN
1000.0000 ug | Freq: Once | INTRAMUSCULAR | Status: AC
  Administered 2017-12-10: 1000 ug via INTRAMUSCULAR

## 2018-01-07 ENCOUNTER — Ambulatory Visit (INDEPENDENT_AMBULATORY_CARE_PROVIDER_SITE_OTHER): Payer: BLUE CROSS/BLUE SHIELD

## 2018-01-07 DIAGNOSIS — E538 Deficiency of other specified B group vitamins: Secondary | ICD-10-CM | POA: Diagnosis not present

## 2018-01-07 MED ORDER — CYANOCOBALAMIN 1000 MCG/ML IJ SOLN
1000.0000 ug | Freq: Once | INTRAMUSCULAR | Status: AC
  Administered 2018-01-07: 1000 ug via INTRAMUSCULAR

## 2018-01-07 NOTE — Progress Notes (Signed)
Pt here for B12 injection. Gave 1000mcg of Cyanocobalamin in R Deltoid. Patient tolerated well. Band-Aid applied.   

## 2018-02-04 ENCOUNTER — Ambulatory Visit (INDEPENDENT_AMBULATORY_CARE_PROVIDER_SITE_OTHER): Payer: BLUE CROSS/BLUE SHIELD

## 2018-02-04 DIAGNOSIS — E538 Deficiency of other specified B group vitamins: Secondary | ICD-10-CM | POA: Diagnosis not present

## 2018-02-04 MED ORDER — CYANOCOBALAMIN 1000 MCG/ML IJ SOLN
1000.0000 ug | Freq: Once | INTRAMUSCULAR | Status: AC
  Administered 2018-02-04: 1000 ug via INTRAMUSCULAR

## 2018-03-11 ENCOUNTER — Ambulatory Visit (INDEPENDENT_AMBULATORY_CARE_PROVIDER_SITE_OTHER): Payer: BLUE CROSS/BLUE SHIELD

## 2018-03-11 DIAGNOSIS — E538 Deficiency of other specified B group vitamins: Secondary | ICD-10-CM | POA: Diagnosis not present

## 2018-03-11 MED ORDER — CYANOCOBALAMIN 1000 MCG/ML IJ SOLN
1000.0000 ug | Freq: Once | INTRAMUSCULAR | Status: AC
Start: 2018-03-11 — End: 2018-03-11
  Administered 2018-03-11: 1000 ug via INTRAMUSCULAR

## 2018-04-15 ENCOUNTER — Ambulatory Visit (INDEPENDENT_AMBULATORY_CARE_PROVIDER_SITE_OTHER): Payer: BLUE CROSS/BLUE SHIELD

## 2018-04-15 DIAGNOSIS — E538 Deficiency of other specified B group vitamins: Secondary | ICD-10-CM

## 2018-04-15 MED ORDER — CYANOCOBALAMIN 1000 MCG/ML IJ SOLN
1000.0000 ug | Freq: Once | INTRAMUSCULAR | Status: AC
Start: 2018-04-15 — End: 2018-04-15
  Administered 2018-04-15: 1000 ug via INTRAMUSCULAR

## 2018-05-20 ENCOUNTER — Ambulatory Visit (INDEPENDENT_AMBULATORY_CARE_PROVIDER_SITE_OTHER): Payer: BLUE CROSS/BLUE SHIELD

## 2018-05-20 DIAGNOSIS — E538 Deficiency of other specified B group vitamins: Secondary | ICD-10-CM

## 2018-05-20 MED ORDER — CYANOCOBALAMIN 1000 MCG/ML IJ SOLN
1000.0000 ug | Freq: Once | INTRAMUSCULAR | Status: AC
  Administered 2018-05-20: 1000 ug via INTRAMUSCULAR

## 2018-05-20 NOTE — Progress Notes (Signed)
Per orders of Dr. Everardo AllEllison, injection of b12 given by Lelon Frohlichashia Golden. Patient tolerated injection well

## 2018-06-24 ENCOUNTER — Ambulatory Visit (INDEPENDENT_AMBULATORY_CARE_PROVIDER_SITE_OTHER): Payer: BLUE CROSS/BLUE SHIELD

## 2018-06-24 DIAGNOSIS — E538 Deficiency of other specified B group vitamins: Secondary | ICD-10-CM | POA: Diagnosis not present

## 2018-06-24 MED ORDER — CYANOCOBALAMIN 1000 MCG/ML IJ SOLN
1000.0000 ug | Freq: Once | INTRAMUSCULAR | Status: AC
  Administered 2018-06-24: 1000 ug via INTRAMUSCULAR

## 2018-06-24 NOTE — Progress Notes (Signed)
Per orders of Dr. Everardo AllEllison injection of b-12 given today by Donnie CoffinLisa W. CMA . Patient tolerated injection well.

## 2018-07-02 NOTE — Progress Notes (Signed)
Per orders of Dr. Ellison injection of B12 given today by Sarah/CMA . Patient tolerated injection well.  

## 2018-07-29 ENCOUNTER — Ambulatory Visit (INDEPENDENT_AMBULATORY_CARE_PROVIDER_SITE_OTHER): Payer: BLUE CROSS/BLUE SHIELD

## 2018-07-29 DIAGNOSIS — E538 Deficiency of other specified B group vitamins: Secondary | ICD-10-CM

## 2018-07-29 MED ORDER — CYANOCOBALAMIN 1000 MCG/ML IJ SOLN
1000.0000 ug | Freq: Once | INTRAMUSCULAR | Status: AC
  Administered 2018-07-29: 1000 ug via INTRAMUSCULAR

## 2018-07-29 NOTE — Progress Notes (Signed)
.  Per orders of Dr. Romero BellingSean Ellison injection of B-12 given today by Emmaline KluverSarah Blayke Cordrey, CMA . Patient tolerated injection well.

## 2018-09-02 ENCOUNTER — Ambulatory Visit (INDEPENDENT_AMBULATORY_CARE_PROVIDER_SITE_OTHER): Payer: BLUE CROSS/BLUE SHIELD

## 2018-09-02 DIAGNOSIS — E538 Deficiency of other specified B group vitamins: Secondary | ICD-10-CM | POA: Diagnosis not present

## 2018-09-02 MED ORDER — CYANOCOBALAMIN 1000 MCG/ML IJ SOLN
1000.0000 ug | Freq: Once | INTRAMUSCULAR | Status: AC
  Administered 2018-09-02: 1000 ug via INTRAMUSCULAR

## 2018-09-02 NOTE — Progress Notes (Signed)
Per orders of Dr. Everardo All injection of 1076mcg/mL B12 given today by Lelon Frohlich RMA . Patient tolerated injection well.

## 2018-10-07 ENCOUNTER — Ambulatory Visit (INDEPENDENT_AMBULATORY_CARE_PROVIDER_SITE_OTHER): Payer: BLUE CROSS/BLUE SHIELD

## 2018-10-07 DIAGNOSIS — E538 Deficiency of other specified B group vitamins: Secondary | ICD-10-CM

## 2018-10-07 MED ORDER — CYANOCOBALAMIN 1000 MCG/ML IJ SOLN
1000.0000 ug | Freq: Once | INTRAMUSCULAR | Status: AC
  Administered 2018-10-07: 1000 ug via INTRAMUSCULAR

## 2018-10-07 NOTE — Progress Notes (Signed)
Per orders of Dr. Everardo All injection of B-12 given today by Efraim Kaufmann, Certified Medical Assistant. . Patient tolerated injection well.

## 2018-11-04 DIAGNOSIS — E538 Deficiency of other specified B group vitamins: Secondary | ICD-10-CM | POA: Diagnosis not present

## 2018-11-04 DIAGNOSIS — Z125 Encounter for screening for malignant neoplasm of prostate: Secondary | ICD-10-CM | POA: Diagnosis not present

## 2018-11-04 DIAGNOSIS — E890 Postprocedural hypothyroidism: Secondary | ICD-10-CM | POA: Diagnosis not present

## 2018-11-04 DIAGNOSIS — E0500 Thyrotoxicosis with diffuse goiter without thyrotoxic crisis or storm: Secondary | ICD-10-CM | POA: Diagnosis not present

## 2018-11-04 DIAGNOSIS — I10 Essential (primary) hypertension: Secondary | ICD-10-CM | POA: Diagnosis not present

## 2018-11-04 DIAGNOSIS — R7309 Other abnormal glucose: Secondary | ICD-10-CM | POA: Diagnosis not present

## 2018-11-11 ENCOUNTER — Telehealth: Payer: Self-pay

## 2018-11-11 ENCOUNTER — Ambulatory Visit (INDEPENDENT_AMBULATORY_CARE_PROVIDER_SITE_OTHER): Payer: BLUE CROSS/BLUE SHIELD

## 2018-11-11 DIAGNOSIS — E538 Deficiency of other specified B group vitamins: Secondary | ICD-10-CM | POA: Diagnosis not present

## 2018-11-11 MED ORDER — CYANOCOBALAMIN 1000 MCG/ML IJ SOLN
1000.0000 ug | Freq: Once | INTRAMUSCULAR | Status: AC
  Administered 2018-11-11: 1000 ug via INTRAMUSCULAR

## 2018-11-11 NOTE — Telephone Encounter (Signed)
[facility]ross the hall [facility] us).  Can they take over the B-12?

## 2018-11-11 NOTE — Telephone Encounter (Signed)
[facility] Pt sees Dr. Gaylan GeroldInger. States they will send a message to Dr. Gaylan GeroldInger to determine if he would be willing to assume B12 injections. Further added, pt had recent B12 lab work performed and WNL. Dr. Gaylan GeroldInger advised to continue B12 inj schedule. States they will also take care of calling pt to schedule B12 inj if Dr. Gaylan GeroldInger agrees to manage. However, will call our office to let us know if Dr. Gaylan GeroldInger refuses to manage.

## 2018-11-11 NOTE — Telephone Encounter (Signed)
Pt presented today for B12 injection. Does not appear this pt has been seen since 12/2016. Office notes indicated f/u prn with Dr Everardo AllEllison. Pt has come q mo for nurse visit B12 injection. Routing message to Dr. Everardo AllEllison for possible reconsideration of need for follow up and labs. Will await response

## 2018-11-11 NOTE — Progress Notes (Signed)
Per orders of Dr. Everardo AllEllison injection of B12 given L deltoid today by A. Aksel Bencomo, LPN . Patient tolerated injection well.

## 2018-12-16 ENCOUNTER — Ambulatory Visit: Payer: BLUE CROSS/BLUE SHIELD

## 2018-12-16 DIAGNOSIS — E538 Deficiency of other specified B group vitamins: Secondary | ICD-10-CM | POA: Diagnosis not present

## 2019-01-15 DIAGNOSIS — E538 Deficiency of other specified B group vitamins: Secondary | ICD-10-CM | POA: Diagnosis not present

## 2019-02-11 DIAGNOSIS — E538 Deficiency of other specified B group vitamins: Secondary | ICD-10-CM | POA: Diagnosis not present

## 2019-03-15 DIAGNOSIS — E538 Deficiency of other specified B group vitamins: Secondary | ICD-10-CM | POA: Diagnosis not present

## 2019-04-12 DIAGNOSIS — E538 Deficiency of other specified B group vitamins: Secondary | ICD-10-CM | POA: Diagnosis not present

## 2019-05-10 DIAGNOSIS — E538 Deficiency of other specified B group vitamins: Secondary | ICD-10-CM | POA: Diagnosis not present

## 2019-06-07 DIAGNOSIS — E538 Deficiency of other specified B group vitamins: Secondary | ICD-10-CM | POA: Diagnosis not present

## 2019-07-05 DIAGNOSIS — E538 Deficiency of other specified B group vitamins: Secondary | ICD-10-CM | POA: Diagnosis not present

## 2019-08-05 DIAGNOSIS — E538 Deficiency of other specified B group vitamins: Secondary | ICD-10-CM | POA: Diagnosis not present

## 2019-09-02 DIAGNOSIS — E538 Deficiency of other specified B group vitamins: Secondary | ICD-10-CM | POA: Diagnosis not present

## 2019-10-04 DIAGNOSIS — E538 Deficiency of other specified B group vitamins: Secondary | ICD-10-CM | POA: Diagnosis not present

## 2019-11-03 DIAGNOSIS — I10 Essential (primary) hypertension: Secondary | ICD-10-CM | POA: Diagnosis not present

## 2019-11-03 DIAGNOSIS — E538 Deficiency of other specified B group vitamins: Secondary | ICD-10-CM | POA: Diagnosis not present

## 2019-11-03 DIAGNOSIS — E0500 Thyrotoxicosis with diffuse goiter without thyrotoxic crisis or storm: Secondary | ICD-10-CM | POA: Diagnosis not present

## 2019-11-03 DIAGNOSIS — R7309 Other abnormal glucose: Secondary | ICD-10-CM | POA: Diagnosis not present

## 2019-11-03 DIAGNOSIS — E890 Postprocedural hypothyroidism: Secondary | ICD-10-CM | POA: Diagnosis not present

## 2020-03-07 DIAGNOSIS — E538 Deficiency of other specified B group vitamins: Secondary | ICD-10-CM | POA: Diagnosis not present

## 2020-11-06 DIAGNOSIS — E0500 Thyrotoxicosis with diffuse goiter without thyrotoxic crisis or storm: Secondary | ICD-10-CM | POA: Diagnosis not present

## 2020-11-06 DIAGNOSIS — Z1159 Encounter for screening for other viral diseases: Secondary | ICD-10-CM | POA: Diagnosis not present

## 2020-11-06 DIAGNOSIS — I10 Essential (primary) hypertension: Secondary | ICD-10-CM | POA: Diagnosis not present

## 2020-11-06 DIAGNOSIS — Z125 Encounter for screening for malignant neoplasm of prostate: Secondary | ICD-10-CM | POA: Diagnosis not present

## 2020-11-06 DIAGNOSIS — E890 Postprocedural hypothyroidism: Secondary | ICD-10-CM | POA: Diagnosis not present

## 2020-11-06 DIAGNOSIS — R7303 Prediabetes: Secondary | ICD-10-CM | POA: Diagnosis not present

## 2020-11-06 DIAGNOSIS — Z0000 Encounter for general adult medical examination without abnormal findings: Secondary | ICD-10-CM | POA: Diagnosis not present

## 2020-11-06 DIAGNOSIS — H9190 Unspecified hearing loss, unspecified ear: Secondary | ICD-10-CM | POA: Diagnosis not present

## 2020-11-06 DIAGNOSIS — E538 Deficiency of other specified B group vitamins: Secondary | ICD-10-CM | POA: Diagnosis not present

## 2020-11-06 DIAGNOSIS — Z23 Encounter for immunization: Secondary | ICD-10-CM | POA: Diagnosis not present

## 2020-11-06 DIAGNOSIS — Z13220 Encounter for screening for lipoid disorders: Secondary | ICD-10-CM | POA: Diagnosis not present

## 2021-02-07 DIAGNOSIS — E039 Hypothyroidism, unspecified: Secondary | ICD-10-CM | POA: Diagnosis not present

## 2021-02-07 DIAGNOSIS — Z23 Encounter for immunization: Secondary | ICD-10-CM | POA: Diagnosis not present

## 2021-11-15 DIAGNOSIS — N183 Chronic kidney disease, stage 3 unspecified: Secondary | ICD-10-CM | POA: Diagnosis not present

## 2021-11-15 DIAGNOSIS — H9190 Unspecified hearing loss, unspecified ear: Secondary | ICD-10-CM | POA: Diagnosis not present

## 2021-11-15 DIAGNOSIS — I10 Essential (primary) hypertension: Secondary | ICD-10-CM | POA: Diagnosis not present

## 2021-11-15 DIAGNOSIS — E890 Postprocedural hypothyroidism: Secondary | ICD-10-CM | POA: Diagnosis not present

## 2021-11-15 DIAGNOSIS — Z23 Encounter for immunization: Secondary | ICD-10-CM | POA: Diagnosis not present

## 2021-11-15 DIAGNOSIS — E0500 Thyrotoxicosis with diffuse goiter without thyrotoxic crisis or storm: Secondary | ICD-10-CM | POA: Diagnosis not present

## 2021-11-15 DIAGNOSIS — R7303 Prediabetes: Secondary | ICD-10-CM | POA: Diagnosis not present

## 2021-11-15 DIAGNOSIS — Z0000 Encounter for general adult medical examination without abnormal findings: Secondary | ICD-10-CM | POA: Diagnosis not present

## 2021-11-15 DIAGNOSIS — E538 Deficiency of other specified B group vitamins: Secondary | ICD-10-CM | POA: Diagnosis not present

## 2022-04-06 ENCOUNTER — Inpatient Hospital Stay (HOSPITAL_COMMUNITY)
Admission: EM | Admit: 2022-04-06 | Discharge: 2022-04-08 | DRG: 065 | Disposition: A | Discharge status: Home / Self Care | Payer: PPO | Attending: Internal Medicine | Admitting: Internal Medicine

## 2022-04-06 ENCOUNTER — Observation Stay (HOSPITAL_COMMUNITY): Payer: PPO

## 2022-04-06 ENCOUNTER — Emergency Department (HOSPITAL_COMMUNITY): Payer: PPO

## 2022-04-06 ENCOUNTER — Encounter (HOSPITAL_COMMUNITY): Payer: Self-pay | Admitting: Emergency Medicine

## 2022-04-06 ENCOUNTER — Other Ambulatory Visit: Payer: Self-pay

## 2022-04-06 DIAGNOSIS — I6389 Other cerebral infarction: Secondary | ICD-10-CM | POA: Diagnosis not present

## 2022-04-06 DIAGNOSIS — I10 Essential (primary) hypertension: Secondary | ICD-10-CM | POA: Diagnosis not present

## 2022-04-06 DIAGNOSIS — I639 Cerebral infarction, unspecified: Secondary | ICD-10-CM | POA: Diagnosis present

## 2022-04-06 DIAGNOSIS — E785 Hyperlipidemia, unspecified: Secondary | ICD-10-CM | POA: Diagnosis not present

## 2022-04-06 DIAGNOSIS — G8191 Hemiplegia, unspecified affecting right dominant side: Secondary | ICD-10-CM | POA: Diagnosis present

## 2022-04-06 DIAGNOSIS — R202 Paresthesia of skin: Secondary | ICD-10-CM | POA: Diagnosis not present

## 2022-04-06 DIAGNOSIS — I6523 Occlusion and stenosis of bilateral carotid arteries: Secondary | ICD-10-CM | POA: Diagnosis not present

## 2022-04-06 DIAGNOSIS — I129 Hypertensive chronic kidney disease with stage 1 through stage 4 chronic kidney disease, or unspecified chronic kidney disease: Secondary | ICD-10-CM | POA: Diagnosis present

## 2022-04-06 DIAGNOSIS — R471 Dysarthria and anarthria: Secondary | ICD-10-CM | POA: Diagnosis present

## 2022-04-06 DIAGNOSIS — E039 Hypothyroidism, unspecified: Secondary | ICD-10-CM | POA: Diagnosis not present

## 2022-04-06 DIAGNOSIS — R29810 Facial weakness: Secondary | ICD-10-CM | POA: Diagnosis not present

## 2022-04-06 DIAGNOSIS — E860 Dehydration: Secondary | ICD-10-CM | POA: Diagnosis present

## 2022-04-06 DIAGNOSIS — R200 Anesthesia of skin: Secondary | ICD-10-CM | POA: Diagnosis not present

## 2022-04-06 DIAGNOSIS — I6329 Cerebral infarction due to unspecified occlusion or stenosis of other precerebral arteries: Principal | ICD-10-CM | POA: Diagnosis present

## 2022-04-06 DIAGNOSIS — N1831 Chronic kidney disease, stage 3a: Secondary | ICD-10-CM | POA: Diagnosis present

## 2022-04-06 DIAGNOSIS — E1122 Type 2 diabetes mellitus with diabetic chronic kidney disease: Secondary | ICD-10-CM | POA: Diagnosis not present

## 2022-04-06 DIAGNOSIS — R29818 Other symptoms and signs involving the nervous system: Secondary | ICD-10-CM | POA: Diagnosis not present

## 2022-04-06 DIAGNOSIS — D751 Secondary polycythemia: Secondary | ICD-10-CM | POA: Diagnosis not present

## 2022-04-06 DIAGNOSIS — Z79890 Hormone replacement therapy (postmenopausal): Secondary | ICD-10-CM | POA: Diagnosis not present

## 2022-04-06 DIAGNOSIS — R001 Bradycardia, unspecified: Secondary | ICD-10-CM | POA: Diagnosis not present

## 2022-04-06 DIAGNOSIS — N179 Acute kidney failure, unspecified: Secondary | ICD-10-CM | POA: Diagnosis present

## 2022-04-06 DIAGNOSIS — I6622 Occlusion and stenosis of left posterior cerebral artery: Secondary | ICD-10-CM | POA: Diagnosis not present

## 2022-04-06 DIAGNOSIS — E890 Postprocedural hypothyroidism: Secondary | ICD-10-CM | POA: Diagnosis present

## 2022-04-06 LAB — COMPREHENSIVE METABOLIC PANEL
ALT: 24 U/L (ref 0–44)
AST: 28 U/L (ref 15–41)
Albumin: 4.2 g/dL (ref 3.5–5.0)
Alkaline Phosphatase: 52 U/L (ref 38–126)
Anion gap: 10 (ref 5–15)
BUN: 15 mg/dL (ref 8–23)
CO2: 23 mmol/L (ref 22–32)
Calcium: 9.5 mg/dL (ref 8.9–10.3)
Chloride: 102 mmol/L (ref 98–111)
Creatinine, Ser: 1.34 mg/dL — ABNORMAL HIGH (ref 0.61–1.24)
GFR, Estimated: 58 mL/min — ABNORMAL LOW (ref >60)
Glucose, Bld: 112 mg/dL — ABNORMAL HIGH (ref 70–99)
Potassium: 3.8 mmol/L (ref 3.5–5.1)
Sodium: 135 mmol/L (ref 135–145)
Total Bilirubin: 1.5 mg/dL — ABNORMAL HIGH (ref 0.3–1.2)
Total Protein: 7.5 g/dL (ref 6.5–8.1)

## 2022-04-06 LAB — CBC
HCT: 51.7 % (ref 39.0–52.0)
Hemoglobin: 17.7 g/dL — ABNORMAL HIGH (ref 13.0–17.0)
MCH: 29.4 pg (ref 26.0–34.0)
MCHC: 34.2 g/dL (ref 30.0–36.0)
MCV: 85.7 fL (ref 80.0–100.0)
Platelets: 254 10*3/uL (ref 150–400)
RBC: 6.03 MIL/uL — ABNORMAL HIGH (ref 4.22–5.81)
RDW: 12.7 % (ref 11.5–15.5)
WBC: 10.6 10*3/uL — ABNORMAL HIGH (ref 4.0–10.5)
nRBC: 0 % (ref 0.0–0.2)

## 2022-04-06 LAB — APTT: aPTT: 27 seconds (ref 24–36)

## 2022-04-06 LAB — I-STAT CHEM 8, ED
BUN: 16 mg/dL (ref 8–23)
Calcium, Ion: 1.16 mmol/L (ref 1.15–1.40)
Chloride: 101 mmol/L (ref 98–111)
Creatinine, Ser: 1.2 mg/dL (ref 0.61–1.24)
Glucose, Bld: 109 mg/dL — ABNORMAL HIGH (ref 70–99)
HCT: 51 % (ref 39.0–52.0)
Hemoglobin: 17.3 g/dL — ABNORMAL HIGH (ref 13.0–17.0)
Potassium: 3.7 mmol/L (ref 3.5–5.1)
Sodium: 138 mmol/L (ref 135–145)
TCO2: 25 mmol/L (ref 22–32)

## 2022-04-06 LAB — CBG MONITORING, ED: Glucose-Capillary: 114 mg/dL — ABNORMAL HIGH (ref 70–99)

## 2022-04-06 LAB — DIFFERENTIAL
Abs Immature Granulocytes: 0.03 10*3/uL (ref 0.00–0.07)
Basophils Absolute: 0 10*3/uL (ref 0.0–0.1)
Basophils Relative: 0 %
Eosinophils Absolute: 0 10*3/uL (ref 0.0–0.5)
Eosinophils Relative: 0 %
Immature Granulocytes: 0 %
Lymphocytes Relative: 27 %
Lymphs Abs: 2.8 10*3/uL (ref 0.7–4.0)
Monocytes Absolute: 0.7 10*3/uL (ref 0.1–1.0)
Monocytes Relative: 6 %
Neutro Abs: 7 10*3/uL (ref 1.7–7.7)
Neutrophils Relative %: 67 %

## 2022-04-06 LAB — PROTIME-INR
INR: 1 (ref 0.8–1.2)
Prothrombin Time: 13.2 seconds (ref 11.4–15.2)

## 2022-04-06 IMAGING — MR MR HEAD W/O CM
10 of 11 series · 43 of 48 positions shown · non-contrast
Comparison: None Available.

CLINICAL DATA: Right-sided numbness and tingling

EXAM:
MRI HEAD WITHOUT CONTRAST
TECHNIQUE: Multiplanar, multiecho pulse sequences of the brain and surrounding
structures were obtained without intravenous contrast.

[Series 5: DWI · axial · 3.0mm · 0.88mm/px · z∈[-75,+71]mm · 10 of 100 slices shown (1 of 4)]
[im 1/100]
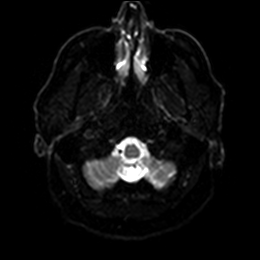
[im 12/100]
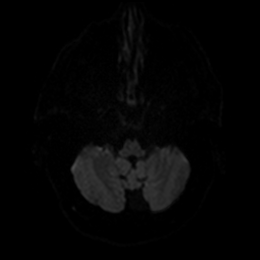
[im 23/100]
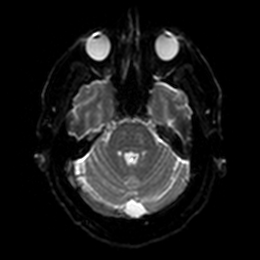
[im 34/100]
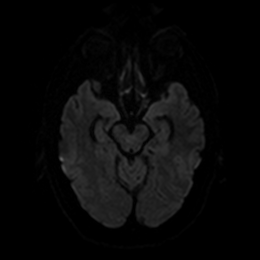
[im 45/100]
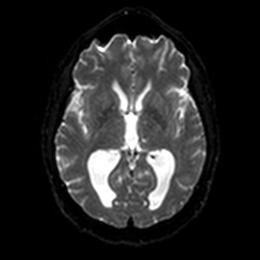
[im 56/100]
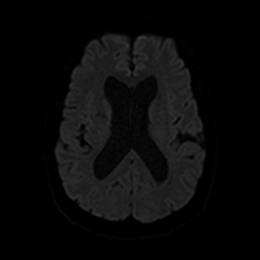
[im 67/100]
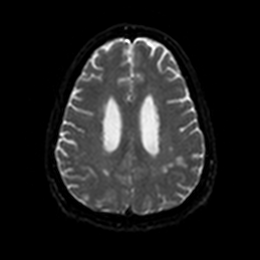
[im 78/100]
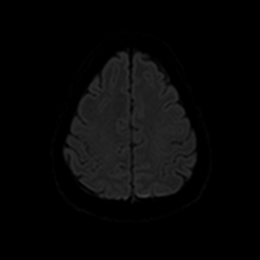
[im 89/100]
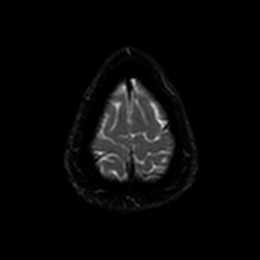
[im 100/100]
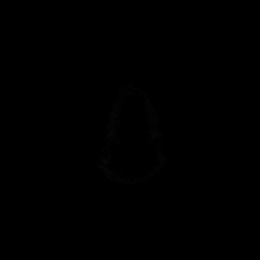

[Series 6: DWI · axial · 3.0mm · 0.88mm/px · z∈[-75,+71]mm · 5 of 50 slices shown (2 of 4)]
[im 1/50]
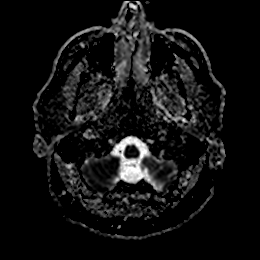
[im 13/50]
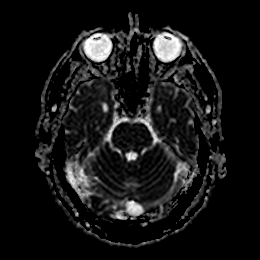
[im 25/50]
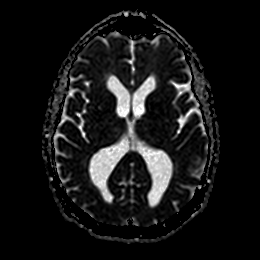
[im 37/50]
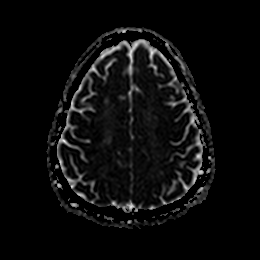
[im 50/50]
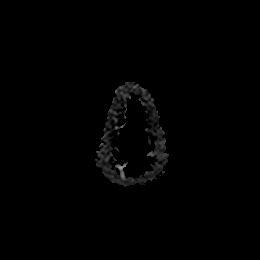

[Series 7: DWI · coronal · 4.0mm · 0.88mm/px · 6 of 66 slices shown (3 of 4)]
[im 1/66]
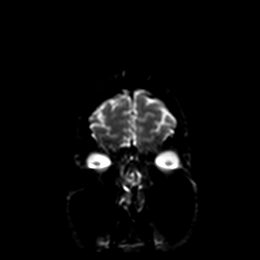
[im 14/66]
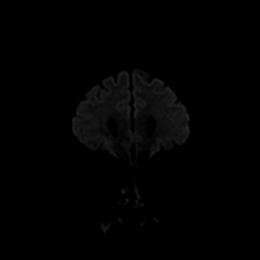
[im 27/66]
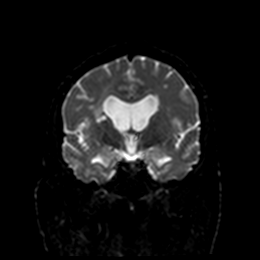
[im 40/66]
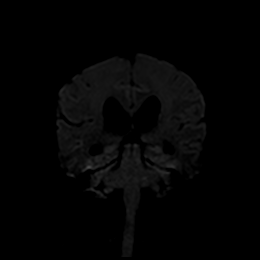
[im 53/66]
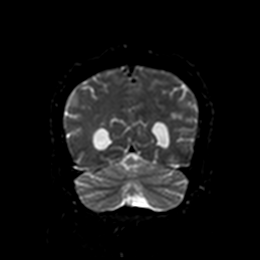
[im 66/66]
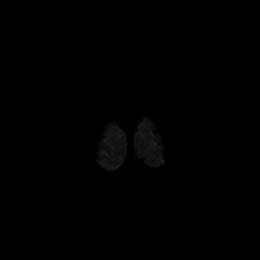

[Series 8: DWI · coronal · 4.0mm · 0.88mm/px · 3 of 33 slices shown (4 of 4)]
[im 1/33]
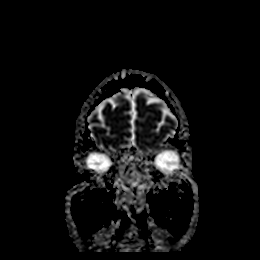
[im 17/33]
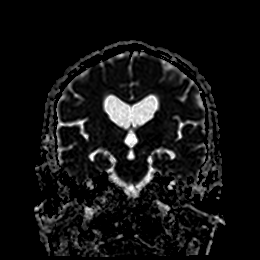
[im 33/33]
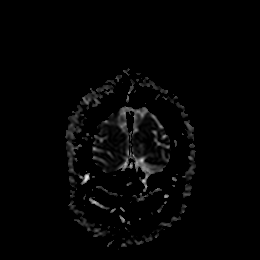

[Series 9: T1 · sagittal · 5.0mm · 0.75mm/px · 2 of 23 slices shown]
[im 1/23]
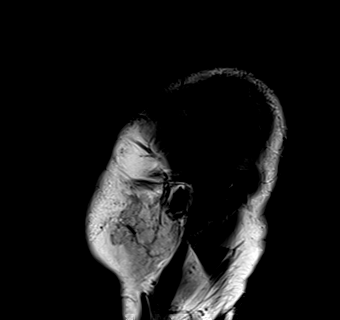
[im 23/23]
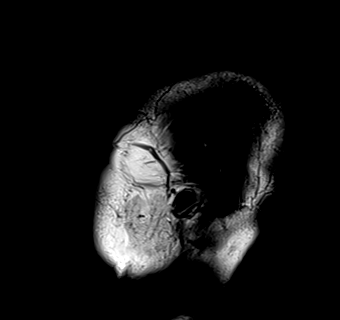

[Series 10: T2 · axial · 5.0mm · 0.72mm/px · z∈[-81,+74]mm · 2 of 27 slices shown (1 of 2)]
[im 1/27]
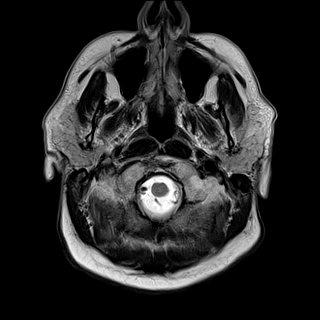
[im 27/27]
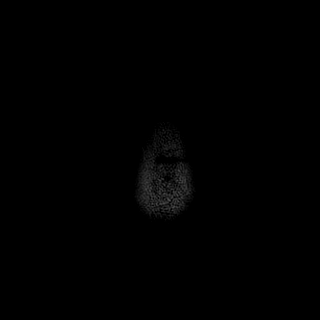

[Series 11: FLAIR · axial · 5.0mm · 0.45mm/px · z∈[-80,+74]mm · 2 of 27 slices shown]
[im 1/27]
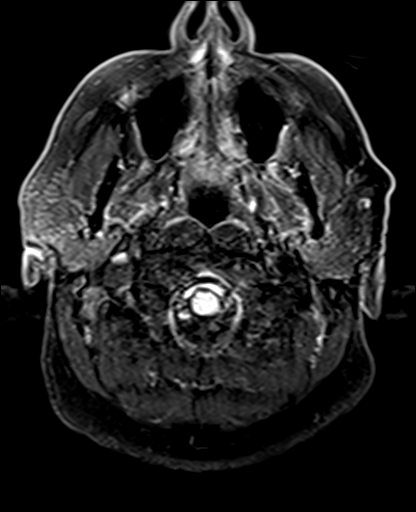
[im 27/27]
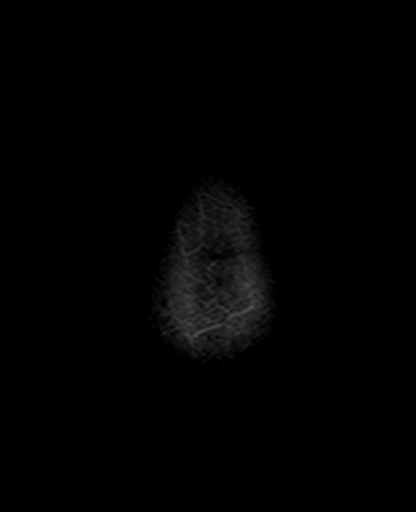

[Series 13: pha_images · axial · 3.0mm · 0.90mm/px · z∈[-91,+84]mm · 5 of 59 slices shown]
[im 1/59]
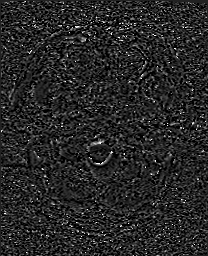
[im 15/59]
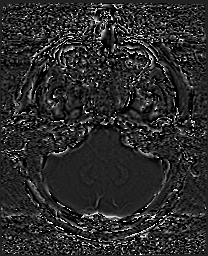
[im 30/59]
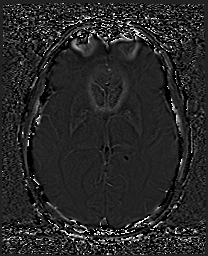
[im 44/59]
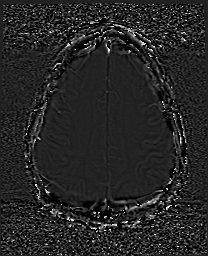
[im 59/59]
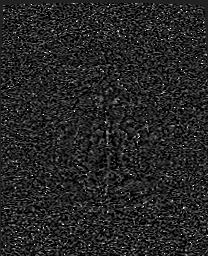

[Series 14: swi_images · axial · 3.0mm · 0.90mm/px · z∈[-91,+84]mm · 5 of 60 slices shown]
[im 1/60]
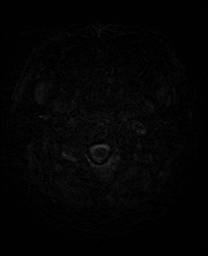
[im 15/60]
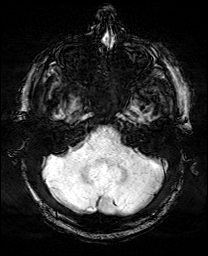
[im 30/60]
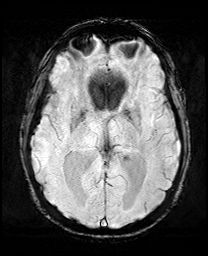
[im 45/60]
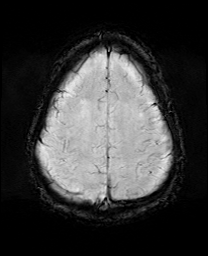
[im 60/60]
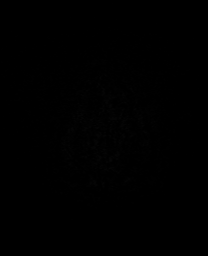

[Series 17: T2 · coronal · 5.0mm · 0.34mm/px · 3 of 29 slices shown (2 of 2)]
[im 1/29]
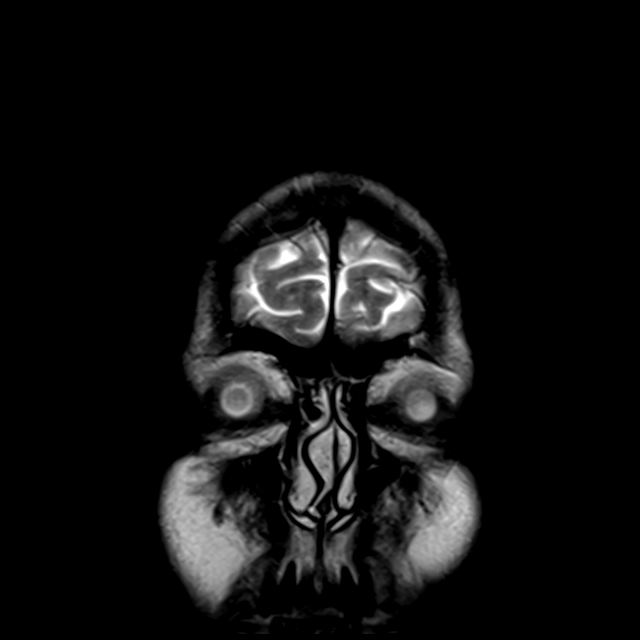
[im 15/29]
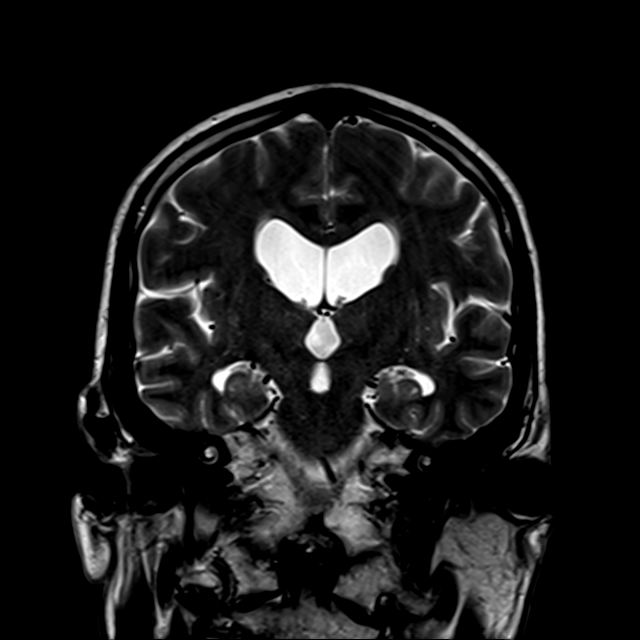
[im 29/29]
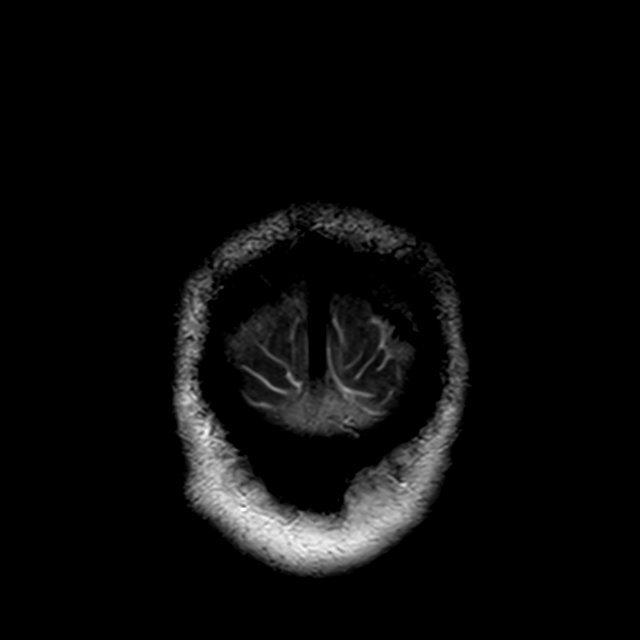

[43 of 48 positions shown; findings below may reference images not displayed]

FINDINGS: Brain: Small focus of acute ischemia within the left paramedian
pons. No acute or chronic hemorrhage. There is multifocal
hyperintense T2-weighted signal within the white matter. Generalized
cerebral volume loss. The midline structures are normal.

Vascular: Major flow voids are preserved.

Skull and upper cervical spine: Normal calvarium and skull base.
Visualized upper cervical spine and soft tissues are normal.

Sinuses/Orbits:No paranasal sinus fluid levels or advanced mucosal
thickening. No mastoid or middle ear effusion. Normal orbits.
IMPRESSION: 1. Small focus of acute ischemia within the left paramedian pons. No
hemorrhage or mass effect.
2. Findings of chronic small vessel ischemia.

## 2022-04-06 IMAGING — CT CT HEAD W/O CM
4 series · 15 of 47 positions shown, 17 images · non-contrast
Comparison: None Available.

CLINICAL DATA: Neuro deficit, acute, stroke suspected.



[Series 3: head wo · axial · 0.36mm/px · z∈[-203,-93]mm · 6 of 37 slices shown, 8 images]
[im 6/37  brain]
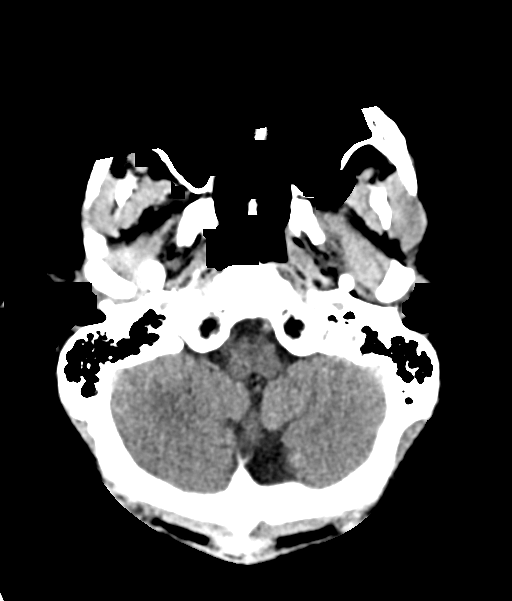
[im 6/37  bone]
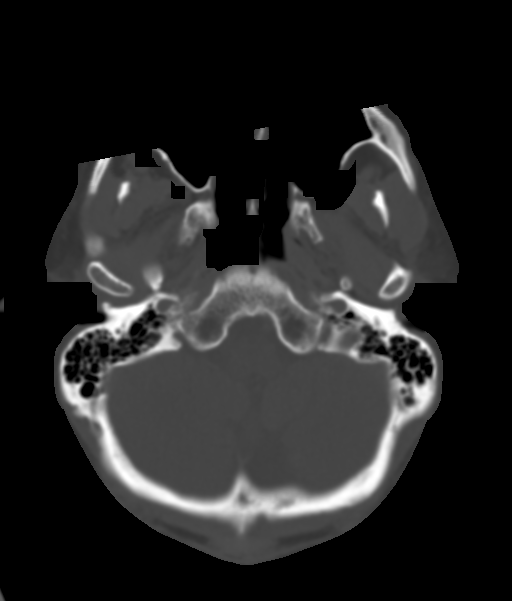
[im 11/37  brain]
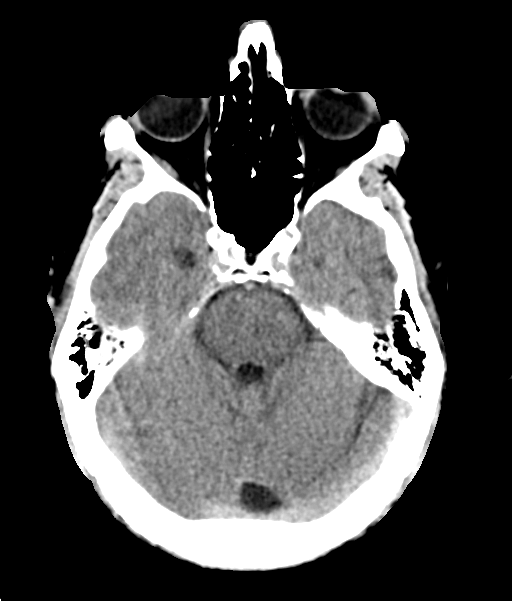
[im 16/37  brain]
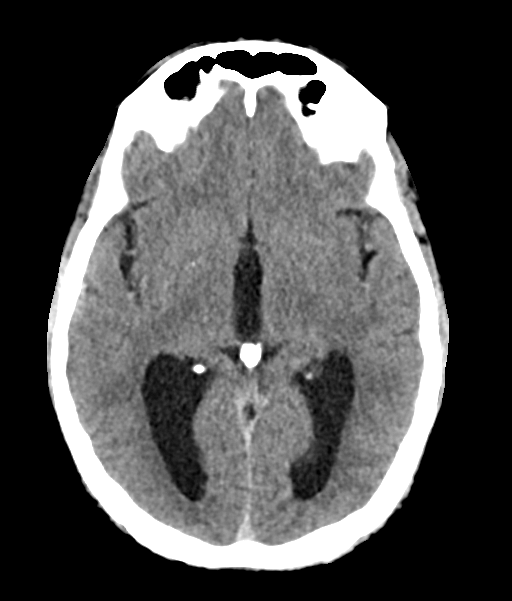
[im 21/37  brain]
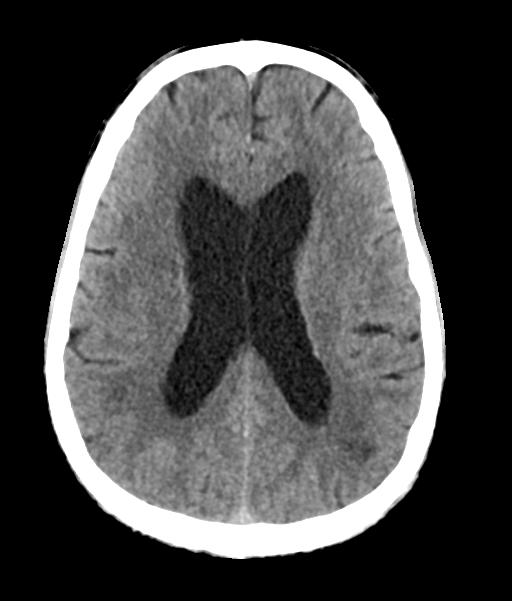
[im 26/37  brain]
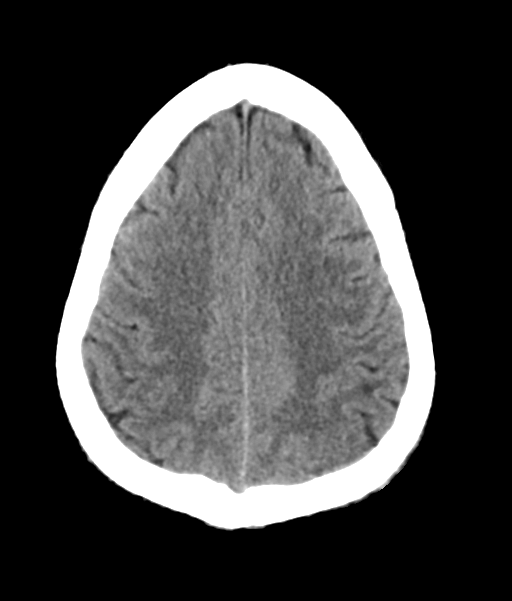
[im 26/37  bone]
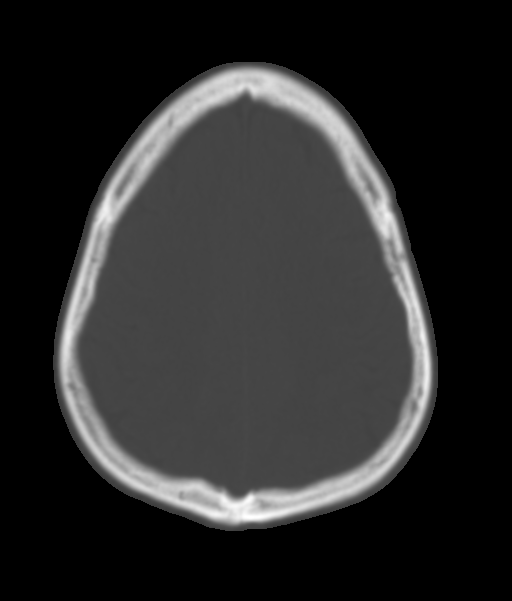
[im 31/37  brain]
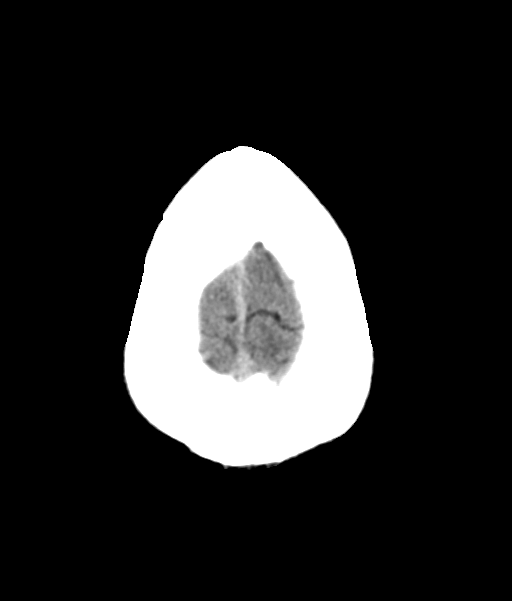

[Series 4: head bone · axial · 0.45mm/px · z∈[-154,-108]mm · 3 of 94 slices shown]
[im 9/94  bone]
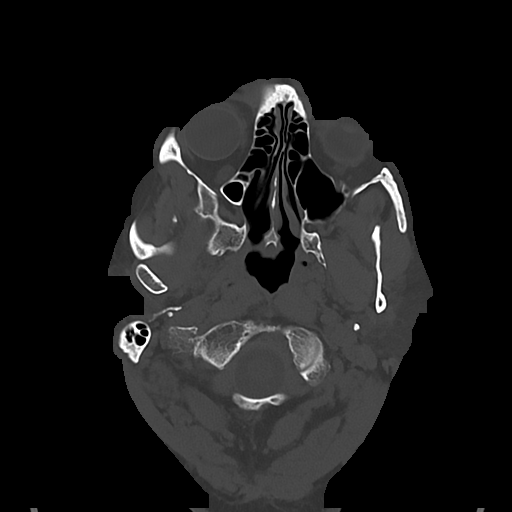
[im 18/94  bone]
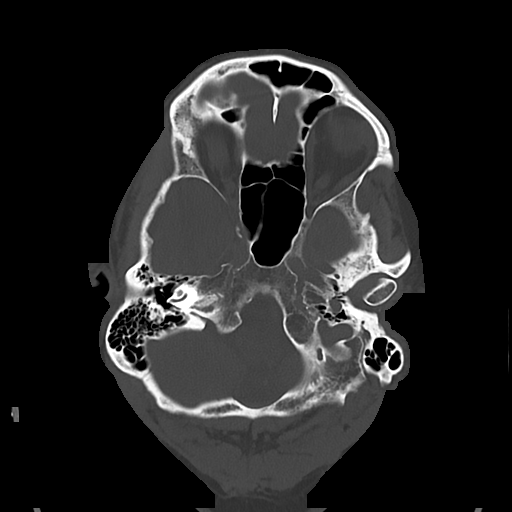
[im 32/94  bone]
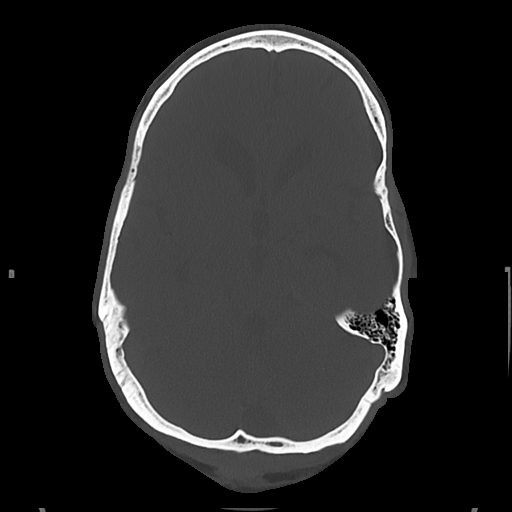

[Series 5: cor soft · coronal · 0.35mm/px · 3 of 73 slices shown]
[im 27/73  brain]
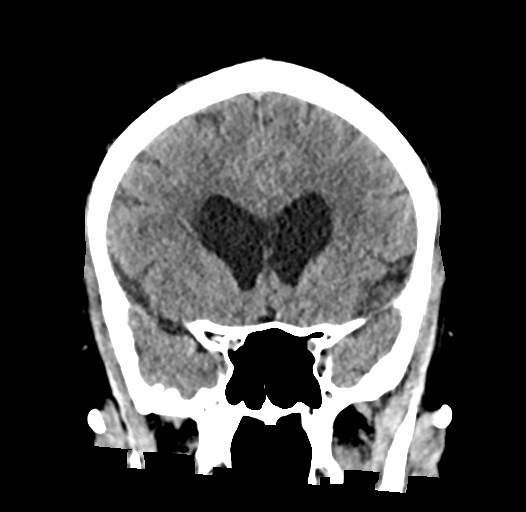
[im 33/73  brain]
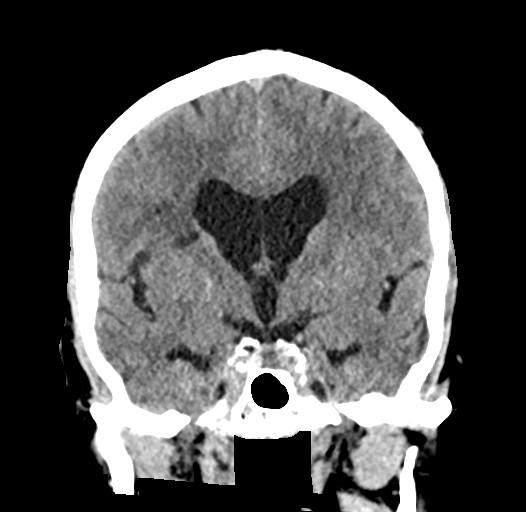
[im 40/73  brain]
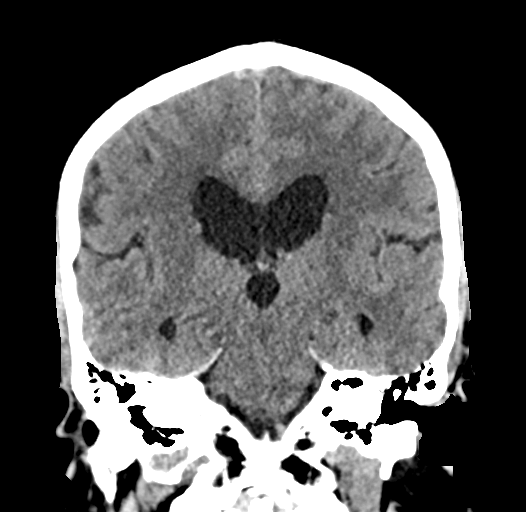

[Series 6: sag soft · sagittal · 0.35mm/px · 3 of 62 slices shown]
[im 21/62  brain]
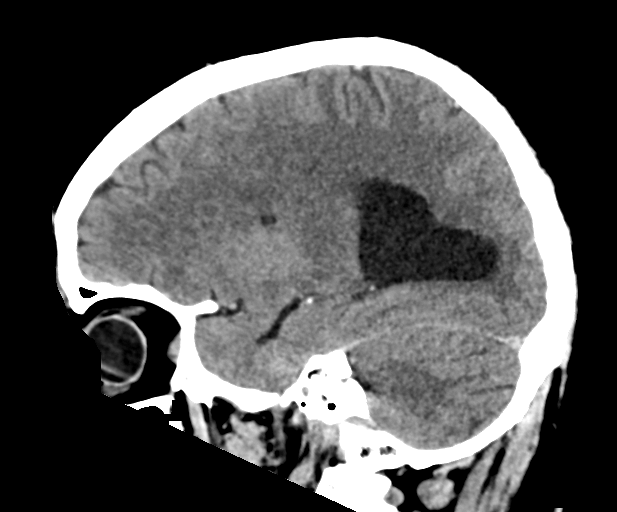
[im 31/62  brain]
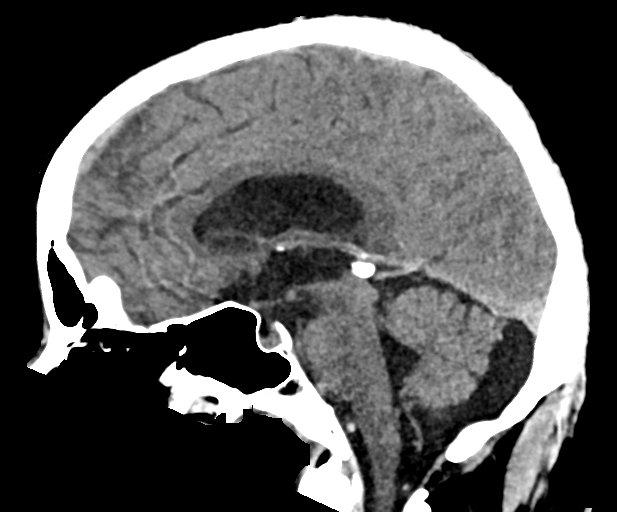
[im 41/62  brain]
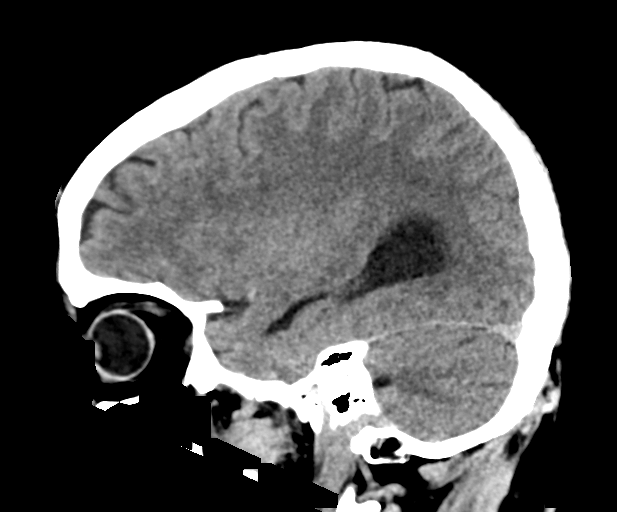

[15 of 47 positions shown; findings below may reference images not displayed]

FINDINGS: Brain: No focal abnormality seen affecting the brainstem or
cerebellum. There is mild ventriculomegaly of the lateral and third
ventricles, with a differential diagnosis of central atrophy versus
communicating hydrocephalus of a mild degree. Focal 9 mm low density
in the corona radiata on the right could represent an acute white
matter tract infarction. Cortical and subcortical low-density in the
left parietal lobe could represent old or acute infarction. No
evidence of hemorrhage, mass effect or extra-axial collection.

Vascular: There is atherosclerotic calcification of the major
vessels at the base of the brain.

Skull: Negative

Sinuses/Orbits: Clear/normal

Other: None
IMPRESSION: 9 mm low-density in the corona radiata on the right that could be an
old or recent infarction.

Indistinct low-density in the left parietal cortical and subcortical
brain could be old or recent infarction.

Prominence of the lateral and third ventricles which could be due to
central atrophy or communicating hydrocephalus.

## 2022-04-06 MED ORDER — LEVOTHYROXINE SODIUM 25 MCG PO TABS
125.0000 ug | ORAL_TABLET | Freq: Every day | ORAL | Status: DC
  Administered 2022-04-07 – 2022-04-08 (×2): 125 ug via ORAL
  Filled 2022-04-06 (×2): qty 1

## 2022-04-06 MED ORDER — LACTATED RINGERS IV SOLN: INTRAVENOUS | Status: AC

## 2022-04-06 MED ORDER — ACETAMINOPHEN 325 MG PO TABS
650.0000 mg | ORAL_TABLET | Freq: Four times a day (QID) | ORAL | Status: DC | PRN
Start: 2022-04-06 — End: 2022-04-08

## 2022-04-06 MED ORDER — ACETAMINOPHEN 650 MG RE SUPP: 650.0000 mg | Freq: Four times a day (QID) | RECTAL | Status: DC | PRN

## 2022-04-06 MED ORDER — IOHEXOL 350 MG/ML SOLN
75.0000 mL | Freq: Once | INTRAVENOUS | Status: AC | PRN
  Administered 2022-04-06: 75 mL via INTRAVENOUS

## 2022-04-06 MED ORDER — ASPIRIN 325 MG PO TABS
325.0000 mg | ORAL_TABLET | Freq: Once | ORAL | Status: AC
  Administered 2022-04-06: 325 mg via ORAL
  Filled 2022-04-06: qty 1

## 2022-04-06 MED ORDER — ONDANSETRON HCL 4 MG/2ML IJ SOLN: 4.0000 mg | Freq: Four times a day (QID) | INTRAMUSCULAR | Status: DC | PRN

## 2022-04-06 MED ORDER — HYDRALAZINE HCL 20 MG/ML IJ SOLN: 10.0000 mg | INTRAMUSCULAR | Status: DC | PRN

## 2022-04-06 MED ORDER — STROKE: EARLY STAGES OF RECOVERY BOOK
Freq: Once | Status: AC
  Filled 2022-04-06: qty 1

## 2022-04-06 MED ORDER — SODIUM CHLORIDE 0.9% FLUSH
3.0000 mL | Freq: Once | INTRAVENOUS | Status: AC
  Administered 2022-04-06: 3 mL via INTRAVENOUS

## 2022-04-06 NOTE — ED Notes (Signed)
Patient transferred to MRI 

## 2022-04-06 NOTE — Assessment & Plan Note (Signed)
? ?#)   acquired hypothyroidism: documented h/o such, and in the context of a history of Graves' disease for which the patient underwent radioactive iodine therapy in January 2017.  Patient has subsequently been on Synthroid.  In the context of the patient's moderate cardia as well as presenting nausea/diarrhea, will also check TSH level. ? ?Plan: cont home Synthroid.  Check TSH level, as above. ? ? ? ?

## 2022-04-06 NOTE — Assessment & Plan Note (Signed)
? ?#)   Erythrocytosis: CBC reflects mildly elevated hemoglobin of 17.7, without prior hemoglobin data point available for comparison.  Suspect contribution from hemoconcentration in the setting of mild dehydration resulting from multiple episodes of nausea/vomiting as well as diarrhea that were self-limited to yesterday.  Of note, patient conveys that he is a lifelong non-smoker.  Will provide gentle IV fluids, and repeat CBC in the morning, ? ?Plan: LR at 75 cc/h x 8 hours.  Monitor strict I's and O's to weights.  Repeat CBC in the morning.  Add on ferritin level.  ? ? ? ?

## 2022-04-06 NOTE — ED Notes (Signed)
Patient returned to room from MRI

## 2022-04-06 NOTE — H&P (Signed)
?History and Physical  ? ? ?PLEASE NOTE THAT DRAGON DICTATION SOFTWARE WAS USED IN THE CONSTRUCTION OF THIS NOTE. ? ? Ivan Lawson GQB:[id number] DOB: 01-04-1956 DOA: 04/06/2022 ? ?PCP: Blair Heys, MD  ?Patient coming from: home  ? ?I have personally briefly reviewed patient's old medical records in [facility] ? ?Chief Complaint: Right-sided numbness ? ?HPI: Ivan Lawson is a 66 y.o. male with medical history significant for essential hypertension, acquired hypothyroidism following radioactive iodine therapy for Graves' disease, prediabetes, who is admitted to [facility] on 04/06/2022 for further evaluation/management of potential stroke after presenting from home to [facility] ED complaining of right-sided numbness.  ? ?The patient reports that he was experiencing some intermittent nausea yesterday, resulting in 2-3 episodes of nonbloody, nonbilious emesis, with last such episode occurring yesterday evening.  This was associated with an episode of loose stool in the absence of any melena or hematochezia, with this episode also occurring yesterday.  Denies any residual nausea/vomiting today.  After the first few episodes of vomiting yesterday, he noted some mild dizziness yesterday afternoon, in the absence of any vertigo.  This also subsequently resolved, and the patient went to bed around 2200 on the evening of 04/05/2022 in his normal state of health.  Denies any associated subjective fever, chills, rigors, or generalized myalgias.  Notices with any abdominal pain or rash.  Denies any recent traveling. ? ?He subsequently awoke on the morning of 04/06/2022 with new onset numbness and paresthesias in the right upper and right lower extremity, with distribution in the right hand and right foot with some proximal extension of these sensory changes in both the right upper and lower extremity.  He also awoke this morning with slurred speech, which is new for him. ? ?Denies any associated acute focal  weakness, dysphagia, vertigo, change in vision, blurry vision, diplopia, word finding difficulties, facial droop, or headache.  Denies any associated chest pain, shortness of breath, palpitations, diaphoresis, presyncope, or syncope. ? ?He notes that while the numbness, paresthesias in the right upper and lower extremities as well as slurred speech persist, that they are slightly improved relative to that with which she awoke this morning.  ? ?Medical/social history notable for history of hypertension, for which she reports good compliance with his outpatient antihypertensive regimen, that includes bisoprolol, Norvasc, HCTZ.  Per chart review, there is also documentation of a history of prediabetes for which the patient is not on any insulin or oral hypoglycemic agents.  Denies any formal diagnosis of diabetes, nor any known history of underlying hyperlipidemia, atrial fibrillation, or obstructive sleep apnea.  Lifelong non-smoker. ? ?On no antiplatelet or anticoagulation medications at the time of the stroke, including no aspirin. Not currently taking a statin. ? ?patient noted to have arrived in [place] ED at 1721 on 04/06/2022.  ? ? ? ?ED Course:  ?Vital signs in the ED were notable for the following: Afebrile; heart rate 49-56; pressure 146/84 -183/95 mmHg; respiratory rate 15-25, oxygen saturation 97 to 100% on room air. ? ?Labs were notable for the following: CBG 114; CMP notable for the following: Sodium 135, potassium 3.8, bicarbonate 23, creatinine 1.34, without any prior serum creatinine data points available for point comparison.  CBC notable for white blood cell count 10,600, hemoglobin 17.7.  INR 1.0. ? ?Imaging and additional notable ED work-up: EKG shows sinus bradycardia with heart rate 54, normal intervals, nonspecific T wave inversion in lead III, otherwise, no evidence of additional T wave or  ST changes, including no evidence of ST elevation, with no prior EKG available for point  comparison. ? ?Noncontrast CT head, per radiology read, showed no evidence of intracranial hemorrhage, while demonstrating a 9 mm low density in the corona radiata on the right that could be an old or recent infarct will also showing indistinct low-density in the left parietal cortical and subcortical brain, which could be an old or recent infarct. ? ?EDP discussed the patient's case with the on-call neurologist, Dr. Amada JupiterKirkpatrick, Who conveyed that the neurology service will formally consult.  he recommended stat CTA head and neck as well as recommending admission to the Hospitalist service for further evaluation and management of potential stroke, including further assessment of potential modifiable acute ischemic CVA risk factors, with additional recs to follow.  ? ?Stat CTA head and neck of been ordered, with results currently pending. ? ?While in the ED, the following were administered: (none) ? ?Subsequently, the patient was admitted for further evaluation and management of potential stroke in the setting of presenting right-sided sensory deficits as well as dysarthria, with presentation also notable for mild sinus bradycardia as well as finding of erythrocytosis.  ? ? ? ?Review of Systems: As per HPI otherwise 10 point review of systems negative.  ? ?Past Medical History:  ?Diagnosis Date  ? Hyperthyroidism   ? ? ?History reviewed. No pertinent surgical history. ? ?Social History: ? reports that he has never smoked. He has never used smokeless tobacco. He reports that he does not currently use alcohol. He reports that he does not currently use drugs. ? ? ?No Known Allergies ? ?Family History  ?Problem Relation Age of Onset  ? Thyroid disease Neg Hx   ? ? ?Family history reviewed and not pertinent  ? ? ?Prior to Admission medications   ?Medication Sig Start Date End Date Taking? Authorizing Provider  ?amLODipine (NORVASC) 10 MG tablet  09/12/15   [provider]  ?bisoprolol-hydrochlorothiazide [facility](ZIAC)  5-6.25 MG tablet  10/30/15   [provider]  ?levothyroxine (SYNTHROID, LEVOTHROID) 125 MCG tablet Take 1 tablet (125 mcg total) by mouth daily before breakfast. 12/29/16   Romero BellingEllison, Sean, MD  ? ? ? ?Objective  ? ? ?Physical Exam: ?Vitals:  ? 04/06/22 1845 04/06/22 1900 04/06/22 1915 04/06/22 1930  ?BP: (!) 170/79 (!) 146/84 (!) 148/91 (!) 159/86  ?Pulse: (!) 55 (!) 55 (!) 58 (!) 55  ?Resp: 16 (!) 25 15 18   ?Temp:      ?TempSrc:      ?SpO2: 99% 100% 98% 97%  ? ? ?General: appears to be stated age; alert, oriented ?Skin: warm, dry, no rash ?Head:  AT/[place] ?Mouth:  Oral mucosa membranes appear dry, normal dentition ?Neck: supple; trachea midline ?Heart:  bradycardia with regular rhythm; did not appreciate any M/R/G ?Lungs: CTAB, did not appreciate any wheezes, rales, or rhonchi ?Abdomen: + BS; soft, ND, NT ?Vascular: 2+ pedal pulses b/l; 2+ radial pulses b/l ?Extremities: no peripheral edema, no muscle wasting ?Neuro: 5/5 strength of the proximal and distal flexors and extensors of the upper and lower extremities bilaterally; sensation intact in upper and lower extremities on left, with sensory deficit noted on right upper and lower extremity; b/l; cranial nerves II through XII grossly intact; no pronator drift; mild dysarthria noted; no e/o facial droop; Normal muscle tone. No tremors. ? ? ? ?Labs on Admission: I have personally reviewed following labs and imaging studies ? ?CBC: ?Recent Labs  ?Lab 04/06/22 ?1727 04/06/22 ?1801  ?WBC 10.6*  --   ?  NEUTROABS 7.0  --   ?HGB 17.7* 17.3*  ?HCT 51.7 51.0  ?MCV 85.7  --   ?PLT 254  --   ? ?Basic Metabolic Panel: ?Recent Labs  ?Lab 04/06/22 ?1727 04/06/22 ?1801  ?NA 135 138  ?K 3.8 3.7  ?CL 102 101  ?CO2 23  --   ?GLUCOSE 112* 109*  ?BUN 15 16  ?CREATININE 1.34* 1.20  ?CALCIUM 9.5  --   ? ?GFR: ?CrCl cannot be calculated (Unknown ideal weight.). ?Liver Function Tests: ?Recent Labs  ?Lab 04/06/22 ?1727  ?AST 28  ?ALT 24  ?ALKPHOS 52  ?BILITOT 1.5*  ?PROT 7.5  ?ALBUMIN  4.2  ? ?No results for input(s): LIPASE, AMYLASE in the last 168 hours. ?No results for input(s): AMMONIA in the last 168 hours. ?Coagulation Profile: ?Recent Labs  ?Lab 04/06/22 ?1727  ?INR 1.0  ? ?Cardiac Enzymes: ?No resu

## 2022-04-06 NOTE — Consult Note (Signed)
?                    NEURO HOSPITALIST CONSULT NOTE  ? ?Requestig physician: Dr. Melina Copa ? ?Reason for Consult: New onset of right-sided numbnesss ? ?History obtained from:   Patient and Chart    ? ?HPI:                                                                                                                                         ? Ivan Lawson is an 66 y.o. male with a PMHx of hyperthyroidism who presented to the ED this evening with a c/c of new onset numbness to the fingers of his right hand as well as the toes of his right foot on Saturday morning. This progressed to then involve tingling of his right arm and numbness of his right lower leg below the knee. He also reported an unsteady gait. On arrival to the ED his speech was clear, face was symmetric and there was no arm drift.  ? ?Past Medical History:  ?Diagnosis Date  ? Hyperthyroidism   ? ? ?History reviewed. No pertinent surgical history. ? ?Family History  ?Problem Relation Age of Onset  ? Thyroid disease Neg Hx   ?        ? ?Social History:  reports that he has never smoked. He has never used smokeless tobacco. He reports that he does not currently use alcohol. He reports that he does not currently use drugs. ? ?No Known Allergies ? ?MEDICATIONS:                                                                                                                     ?No current facility-administered medications on file prior to encounter.  ? ?Current Outpatient Medications on File Prior to Encounter  ?Medication Sig Dispense Refill  ? amLODipine (NORVASC) 10 MG tablet Take 10 mg by mouth daily.  3  ? bisoprolol-hydrochlorothiazide (ZIAC) 5-6.25 MG tablet Take 1 tablet by mouth daily.  0  ? levothyroxine (SYNTHROID) 137 MCG tablet Take 137 mcg by mouth every morning.    ? vitamin B-12 (CYANOCOBALAMIN) 1000 MCG tablet Take 1,000 mcg by mouth daily.    ? levothyroxine (SYNTHROID, LEVOTHROID) 125 MCG tablet Take 1 tablet (125 mcg total) by mouth daily  before breakfast. (Patient not taking: Reported on 04/06/2022) 90 tablet 2  ? ? ? ?  ROS:                                                                                                                                       ?No CP, SOB, abdominal pain, rash, fever or headache. Has had some light sensitivity. Other ROS as per HPI.  ? ? ?Blood pressure (!) 168/96, pulse (!) 50, temperature 98.4 ?F (36.9 ?C), temperature source Oral, resp. rate 15, SpO2 100 %. ? ? ?General Examination:                                                                                                      ? ?Physical Exam  ?HEENT-  [place]/AT  ?Lungs- Respirations unlabored ?Extremities- No edema ? ?Neurological Examination ?Mental Status: Awake and alert. Fully oriented x 5. Affect euthymic. Speech fluent with intact comprehension and naming. Mild dysarthria noted. Able to follow all commands without difficulty. ?Cranial Nerves: ?II: Temporal visual fields intact with no extinction to DSS. Right pupil with subtle irregularity, left pupil normal; both pupils equally reactive.  ?III,IV, VI: EOMI. No nystagmus. No ptosis.  ?V: Temp sensation equal bilaterally ?VII: Subtle right facial droop.  ?VIII: Hearing intact to voice ?IX,X: No hypophonia or hoarseness ?XI: Symmetric shoulder shrug ?XII: Midline tongue extension ?Motor: ?BUE 5/5 proximally and distally. No pronator drift ?RLE 5/5 proximally and distally ?LLE 5/5 proximally and distally ?Sensory: Temp and light touch sensation intact to all 4 extremities without asymmetry. No extinction to DSS. ?Deep Tendon Reflexes: 2+ bilateral brachioradialis and biceps. 4+ left patellar (crossed adductor response on the right), 3+ right patellar, 0 left achilles, 2+ right achilles ?Plantars: Right: downgoing  Left: downgoing ?Cerebellar: Normal FNF on the left. Mild dystaxia with right FNF.  ?Gait: Deferred ?  ?Lab Results: ?Basic Metabolic Panel: ?Recent Labs  ?Lab 04/06/22 ?1727 04/06/22 ?1801  ?NA 135 138   ?K 3.8 3.7  ?CL 102 101  ?CO2 23  --   ?GLUCOSE 112* 109*  ?BUN 15 16  ?CREATININE 1.34* 1.20  ?CALCIUM 9.5  --   ? ? ?CBC: ?Recent Labs  ?Lab 04/06/22 ?1727 04/06/22 ?1801  ?WBC 10.6*  --   ?NEUTROABS 7.0  --   ?HGB 17.7* 17.3*  ?HCT 51.7 51.0  ?MCV 85.7  --   ?PLT 254  --   ? ? ?Cardiac Enzymes: ?No results for input(s): CKTOTAL, CKMB, CKMBINDEX, TROPONINI in the last 168 hours. ? ?Lipid Panel: ?No results for input(s): CHOL, TRIG, HDL, CHOLHDL, VLDL, LDLCALC in  the last 168 hours. ? ?Imaging: ?CT HEAD WO CONTRAST ? ?Result Date: 04/06/2022 ?CLINICAL DATA:  Neuro deficit, acute, stroke suspected. EXAM: CT HEAD WITHOUT CONTRAST TECHNIQUE: Contiguous axial images were obtained from the base of the skull through the vertex without intravenous contrast. RADIATION DOSE REDUCTION: This exam was performed according to the departmental dose-optimization program which includes automated exposure control, adjustment of the mA and/or kV according to patient size and/or use of iterative reconstruction technique. COMPARISON:  None Available. FINDINGS: Brain: No focal abnormality seen affecting the brainstem or cerebellum. There is mild ventriculomegaly of the lateral and third ventricles, with a differential diagnosis of central atrophy versus communicating hydrocephalus of a mild degree. Focal 9 mm low density in the corona radiata on the right could represent an acute white matter tract infarction. Cortical and subcortical low-density in the left parietal lobe could represent old or acute infarction. No evidence of hemorrhage, mass effect or extra-axial collection. Vascular: There is atherosclerotic calcification of the major vessels at the base of the brain. Skull: Negative Sinuses/Orbits: Clear/normal Other: None IMPRESSION: 9 mm low-density in the corona radiata on the right that could be an old or recent infarction. Indistinct low-density in the left parietal cortical and subcortical brain could be old or recent  infarction. Prominence of the lateral and third ventricles which could be due to central atrophy or communicating hydrocephalus. Electronically Signed   By: Nelson Chimes M.D.   On: 04/06/2022 17:56   ? ? ?Assessment: 66 year old male with new onset of right sided numbness ?1. Exam reveals subtle right facial droop, subtle dysarthria and mild dystaxia to his RUE.  ?2. CT head: 9 mm low-density in the corona radiata on the right that could be an old or recent infarction. Indistinct low-density in the left parietal cortical and subcortical brain could be old or recent infarction. Prominence of the lateral and third ventricles which could be due to central atrophy or communicating hydrocephalus. ?3. Most likely etiology for his presentation is a subacute posterior fossa lacunar infarction versus a left thalamic lacunar infarction.  ? ?Recommendations: ?1. HgbA1c, fasting lipid panel ?2. MRI of the brain without contrast ?3. PT consult, OT consult, Speech consult ?4. Echocardiogram ?5. Start atorvastatin 40 mg po qd.  ?6. ASA 325 mg given in the ED. Start 81 mg po qAM thereafter.   ?7. Risk factor modification ?8. Telemetry monitoring ?9. Frequent neuro checks ?10. NPO until passes stroke swallow screen ?11. CTA of head and neck. ?12. BP management with permissive HTN x 24 hours.  ?  ? ?Addendum: ?- MRI brain: Small focus of acute ischemia within the left paramedian pons. No hemorrhage or mass effect. Findings of chronic small vessel ischemia. ?- CTA of head and neck: No intracranial large vessel occlusion. No flow limiting proximal ?stenosis. 50% left PCA P1 stenosis. Primary anterior circulation supply to the posterior cerebral arteries. Atherosclerotic change at both carotid bifurcations, left more than right, but without stenosis by NASCET criteria. Atherosclerotic calcification in the carotid siphon regions with 30-50% stenosis in the distal siphon region on both sides. ?  ?Electronically signed: Dr. Kerney Elbe ?04/06/2022, 7:29 PM ? ? ? ?   ?

## 2022-04-06 NOTE — ED Provider Notes (Signed)
?[facility] EMERGENCY DEPARTMENT ?Provider Note ? ? ?CSN: [id number] ?Arrival date & time: 04/06/22  1721 ? ?  ? ?History ? ?No chief complaint on file. ? ? ?Ivan Lawson is a 66 y.o. male.  History of hypertension.  He said he felt dizzy and off-balance yesterday.  This was followed by an episode of nausea and vomiting, he thought it was a 24-hour bug.  Today when he woke up he noticed some tingling in his right hand and right foot that would sometimes radiate up his arm and leg.  It was associated with some slurring in his speech.  He thought there might have been a little bit of visual component to it in his peripheral vision.  Denies any headache or blurry vision.  He feels his symptoms are improved from this morning. ? ?The history is provided by the patient.  ?Cerebrovascular Accident ?This is a new problem. The current episode started yesterday. The problem occurs constantly. The problem has been gradually improving. Pertinent negatives include no chest pain, no abdominal pain, no headaches and no shortness of breath. Nothing aggravates the symptoms. Nothing relieves the symptoms. He has tried rest for the symptoms. The treatment provided mild relief.  ? ?  ? ?Home Medications ?Prior to Admission medications   ?Medication Sig Start Date End Date Taking? Authorizing Provider  ?amLODipine (NORVASC) 10 MG tablet  09/12/15   [provider]  ?bisoprolol-hydrochlorothiazide [facility]) 5-6.25 MG tablet  10/30/15   [provider]  ?levothyroxine (SYNTHROID, LEVOTHROID) 125 MCG tablet Take 1 tablet (125 mcg total) by mouth daily before breakfast. 12/29/16   Romero Belling, MD  ?   ? ?Allergies    ?Patient has no known allergies.   ? ?Review of Systems   ?Review of Systems  ?Constitutional:  Negative for fever.  ?HENT:  Negative for sore throat.   ?Eyes:  Positive for visual disturbance.  ?Respiratory:  Negative for shortness of breath.   ?Cardiovascular:  Negative for chest pain.   ?Gastrointestinal:  Negative for abdominal pain.  ?Genitourinary:  Negative for dysuria.  ?Musculoskeletal:  Positive for gait problem.  ?Skin:  Negative for rash.  ?Neurological:  Positive for speech difficulty and numbness. Negative for headaches.  ? ?Physical Exam ?Updated Vital Signs ?BP (!) 183/95   Pulse (!) 59   Temp 98.4 ?F (36.9 ?C) (Oral)   Resp 18   SpO2 100%  ?Physical Exam ?Vitals and nursing note reviewed.  ?Constitutional:   ?   General: He is not in acute distress. ?   Appearance: Normal appearance. He is well-developed.  ?HENT:  ?   Head: Normocephalic and atraumatic.  ?Eyes:  ?   Conjunctiva/sclera: Conjunctivae normal.  ?Cardiovascular:  ?   Rate and Rhythm: Normal rate and regular rhythm.  ?   Heart sounds: No murmur heard. ?Pulmonary:  ?   Effort: Pulmonary effort is normal. No respiratory distress.  ?   Breath sounds: Normal breath sounds.  ?Abdominal:  ?   Palpations: Abdomen is soft.  ?   Tenderness: There is no abdominal tenderness.  ?Musculoskeletal:     ?   General: No swelling.  ?   Cervical back: Neck supple.  ?Skin: ?   General: Skin is warm and dry.  ?   Capillary Refill: Capillary refill takes less than 2 seconds.  ?Neurological:  ?   Mental Status: He is alert.  ?   Comments: Patient is awake and alert.  His speech is somewhat slurred.  I do not appreciate any obvious facial asymmetry.  His grips are equal and her upper extremity strength is intact.  Lower extremity strength is 5 out of 5.  he can do finger-nose and heel shin but he says he just takes more concentration for him.  ?Psychiatric:     ?   Mood and Affect: Mood normal.  ? ? ?ED Results / Procedures / Treatments   ?Labs ?(all labs ordered are listed, but only abnormal results are displayed) ?Labs Reviewed  ?CBC - Abnormal; Notable for the following components:  ?    Result Value  ? WBC 10.6 (*)   ? RBC 6.03 (*)   ? Hemoglobin 17.7 (*)   ? All other components within normal limits  ?COMPREHENSIVE METABOLIC PANEL -  Abnormal; Notable for the following components:  ? Glucose, Bld 112 (*)   ? Creatinine, Ser 1.34 (*)   ? Total Bilirubin 1.5 (*)   ? GFR, Estimated 58 (*)   ? All other components within normal limits  ?URINALYSIS, COMPLETE (UACMP) WITH MICROSCOPIC - Abnormal; Notable for the following components:  ? Specific Gravity, Urine 1.043 (*)   ? All other components within normal limits  ?I-STAT CHEM 8, ED - Abnormal; Notable for the following components:  ? Glucose, Bld 109 (*)   ? Hemoglobin 17.3 (*)   ? All other components within normal limits  ?CBG MONITORING, ED - Abnormal; Notable for the following components:  ? Glucose-Capillary 114 (*)   ? All other components within normal limits  ?PROTIME-INR  ?APTT  ?DIFFERENTIAL  ?RAPID URINE DRUG SCREEN, HOSP PERFORMED  ?MAGNESIUM  ?MAGNESIUM  ?COMPREHENSIVE METABOLIC PANEL  ?CBC WITH DIFFERENTIAL/PLATELET  ?FERRITIN  ?LIPID PANEL  ?HEMOGLOBIN A1C  ?TSH  ? ? ?EKG ?EKG Interpretation ? ?Date/Time:  Saturday Apr 06 2022 17:33:16 EDT ?Ventricular Rate:  54 ?PR Interval:  150 ?QRS Duration: 92 ?QT Interval:  426 ?QTC Calculation: 403 ?R Axis:   20 ?Text Interpretation: Sinus bradycardia Cannot rule out Anterior infarct , age undetermined Abnormal ECG No previous ECGs available Confirmed by Meridee ScoreButler, Helix Lafontaine [phone]([id number]) on 04/06/2022 5:51:19 PM ? ?Radiology ?CT HEAD WO CONTRAST ? ?Result Date: 04/06/2022 ?CLINICAL DATA:  Neuro deficit, acute, stroke suspected. EXAM: CT HEAD WITHOUT CONTRAST TECHNIQUE: Contiguous axial images were obtained from the base of the skull through the vertex without intravenous contrast. RADIATION DOSE REDUCTION: This exam was performed according to the departmental dose-optimization program which includes automated exposure control, adjustment of the mA and/or kV according to patient size and/or use of iterative reconstruction technique. COMPARISON:  None Available. FINDINGS: Brain: No focal abnormality seen affecting the brainstem or cerebellum. There is mild  ventriculomegaly of the lateral and third ventricles, with a differential diagnosis of central atrophy versus communicating hydrocephalus of a mild degree. Focal 9 mm low density in the corona radiata on the right could represent an acute white matter tract infarction. Cortical and subcortical low-density in the left parietal lobe could represent old or acute infarction. No evidence of hemorrhage, mass effect or extra-axial collection. Vascular: There is atherosclerotic calcification of the major vessels at the base of the brain. Skull: Negative Sinuses/Orbits: Clear/normal Other: None IMPRESSION: 9 mm low-density in the corona radiata on the right that could be an old or recent infarction. Indistinct low-density in the left parietal cortical and subcortical brain could be old or recent infarction. Prominence of the lateral and third ventricles which could be due to central atrophy or communicating hydrocephalus. Electronically Signed   By:  Paulina Fusi M.D.   On: 04/06/2022 17:56   ? ?Procedures ?Marland KitchenCritical Care ?Performed by: Terrilee Files, MD ?Authorized by: Terrilee Files, MD  ? ?Critical care provider statement:  ?  Critical care time (minutes):  45 ?  Critical care time was exclusive of:  Separately billable procedures and treating other patients ?  Critical care was necessary to treat or prevent imminent or life-threatening deterioration of the following conditions:  CNS failure or compromise ?  Critical care was time spent personally by me on the following activities:  Development of treatment plan with patient or surrogate, discussions with consultants, evaluation of patient's response to treatment, examination of patient, obtaining history from patient or surrogate, ordering and performing treatments and interventions, ordering and review of laboratory studies, ordering and review of radiographic studies, pulse oximetry, re-evaluation of patient's condition and review of old charts ?  I assumed  direction of critical care for this patient from another provider in my specialty: no    ? ? ?Medications Ordered in ED ?Medications  ?acetaminophen (TYLENOL) tablet 650 mg (has no administration in time range)  ?  Or  ?a

## 2022-04-06 NOTE — Assessment & Plan Note (Signed)
? ?#)   Sinus bradycardia: Heart rates on symmetry noted to be in the high 40s to high 50s, with EKG demonstrating sinus bradycardia without evidence of AV block, nor any evidence of overt acute ischemic changes.  Patient appears asymptomatic as relates to his bradycardia, with unclear baseline heart rate as an outpatient.  No evidence of hemodynamic compromise.  Of note, he is on no AV nodal blocking agent at home, where he takes bisoprolol. Will hold bisoprolol in the setting of observance of permissive hypertension in setting of concern for septic stroke, as above, in addition to his presenting mild sinus bradycardia.  Of note, presenting potassium level 3.8, and no evidence of associated acute hypoxia. ? ?Plan: Monitor on telemetry.  Add on serum magnesium level.  Repeat CMP in the morning.  Holding home beta-blocker for now, as above.  Check TSH. ? ? ?

## 2022-04-06 NOTE — Assessment & Plan Note (Signed)
?#)   Right-sided numbness/paresthesias with dysarthria: Awoke on the morning of 04/06/2022 with new onset numbness, paresthesias in the right upper and lower extremities associated with new onset dysarthria after going to bed around 2200 on 04/05/2022 in normal state of health, with associated last known normal at approximately 2200 on 04/05/2022.  All the systems persist, patient notes that they are mildly improved relative to onset.  Patient's nausea/vomiting/diarrhea that were self-limited yesterday are noted but initially felt to be most likely to be as a result of posterior stroke, although it is possible that the patient became dehydrated as a result of these GI losses creating physiologic conditions for supply/demand mismatch in the setting of underlying atherosclerotic disease. ? ?Noncontrast CT head, per radiology read, showed no evidence of intracranial hemorrhage, while demonstrating a 9 mm low density in the corona radiata on the right that could be an old or recent infarct will also showing indistinct low-density in the left parietal cortical and subcortical brain, which could be an old or recent infarct. ? ?EDP discussed case/imaging with on-call neurologist, Dr. Amada Jupiter, Who recommended stat CTA head/neck and admission to the hospital service for further evaluation/management of potential stroke, with neurology service to formally consult, with additional recommendations pending at this time. ? ?Ensuing CTA head and neck showed no evidence of large vessel occlusion or any evidence of dissection/aneurysm, while notable for 50% Left PCA Pa stenosis as well as atherosclerotic changes to bilateral carotid bifurcations, left greater than right, but without formal stenosis.  ? ?Of note, the patient reportedly possesses some known modifiable CVA risk factors including a history HTN, prediabetes, while denying any known history of formal diabetes, hyperlipidemia, surface of apnea, paroxysmal atrial  fibrillation, and will reporting that he is a lifelong non-smoker.  EKG demonstrates sinus bradycardia without overt evidence of acute ischemic changes, as further detailed above. ? ?Not a candidate for TPA administration given that presentation is outside of the window for administration of such.  ? ?Current outpatient antiplatelet/anticoagulant regimen: None.  As it does not appear that the patient has received aspirin yet, will place order for full assessment x1 now. Current outpatient anti-lipid regimen: None.  ? ?Will allow for permissive hypertension for 48 hours following LKN, with associated parameters further quantified below, and with period of observance of permissive hypertension to end at 2200 on 04/07/22.  ? ? ? ?Plan: Nursing bedside swallow evaluation x 1 now, and will not initiate oral medications or diet until the patient has passed this. Head of the bed at 30 degrees. Neuro checks per protocol. VS per protocol. Will allow for permissive hypertension for 48 hours following LKN, as above, during which will hold home antihypertensive medications, with prn IV hydralazine ordered for systolic blood pressure greater than 220 mmHg or diastolic blood pressure greater than 110 mmHg until 2200 on 04/07/22. Monitor on telemetry, including monitoring for atrial fibrillation as modifiable risk factor for acute ischemic CVA.   MRI brain. TTE with out bubble study has been ordered for the morning.  ?Check lipid panel and A1c. PT/OT/ST consults have been ordered to occur in the morning.  Full dose aspirin x1 dose now, as above.  Neurology consulted.  Additional antiplatelet recommendations per ensuing neurology recs. ? ? ? ?

## 2022-04-06 NOTE — Assessment & Plan Note (Signed)
? ?#)   Essential Hypertension: documented h/o such, with outpatient antihypertensive regimen including lisinopril, Norvasc, HCTZ.  SBP's in the ED today: 140s to 180s mmHg.  In the setting of concern for presenting stroke, will observe permissive hypertension, as further detailed above, during which time will hold home hypertensive medications.  Patient's mild bradycardia is also noted and further described above.  ? ?Plan: Close monitoring of subsequent BP via routine VS. permissive hypertension until 2200 on 04/07/2022, prn IV hydralazine, with parameters as further defined above.  Holding home antihypertensive medications during this.  Permissive hypertension.  Monitor on symmetry. ? ? ? ?

## 2022-04-06 NOTE — ED Triage Notes (Signed)
C/o vomiting and diarrhea yesterday that resolved.  Woke up with numbness to R fingers and toes this morning with tingling to R arm and numbness up R lower leg.  Reports unsteady gait.  No arm drift.  VAN negative.  No facial droop.  Speech clear and names objects. ?

## 2022-04-07 ENCOUNTER — Observation Stay (HOSPITAL_COMMUNITY): Payer: PPO

## 2022-04-07 DIAGNOSIS — R471 Dysarthria and anarthria: Secondary | ICD-10-CM | POA: Diagnosis present

## 2022-04-07 DIAGNOSIS — E860 Dehydration: Secondary | ICD-10-CM | POA: Diagnosis present

## 2022-04-07 DIAGNOSIS — E039 Hypothyroidism, unspecified: Secondary | ICD-10-CM | POA: Diagnosis not present

## 2022-04-07 DIAGNOSIS — I6389 Other cerebral infarction: Secondary | ICD-10-CM | POA: Diagnosis not present

## 2022-04-07 DIAGNOSIS — I6329 Cerebral infarction due to unspecified occlusion or stenosis of other precerebral arteries: Secondary | ICD-10-CM | POA: Diagnosis present

## 2022-04-07 DIAGNOSIS — I10 Essential (primary) hypertension: Secondary | ICD-10-CM | POA: Diagnosis present

## 2022-04-07 DIAGNOSIS — R001 Bradycardia, unspecified: Secondary | ICD-10-CM | POA: Diagnosis present

## 2022-04-07 DIAGNOSIS — E890 Postprocedural hypothyroidism: Secondary | ICD-10-CM | POA: Diagnosis present

## 2022-04-07 DIAGNOSIS — E1122 Type 2 diabetes mellitus with diabetic chronic kidney disease: Secondary | ICD-10-CM | POA: Diagnosis present

## 2022-04-07 DIAGNOSIS — I639 Cerebral infarction, unspecified: Secondary | ICD-10-CM | POA: Diagnosis present

## 2022-04-07 DIAGNOSIS — I6523 Occlusion and stenosis of bilateral carotid arteries: Secondary | ICD-10-CM | POA: Diagnosis present

## 2022-04-07 DIAGNOSIS — R29810 Facial weakness: Secondary | ICD-10-CM | POA: Diagnosis present

## 2022-04-07 DIAGNOSIS — N1831 Chronic kidney disease, stage 3a: Secondary | ICD-10-CM | POA: Diagnosis present

## 2022-04-07 DIAGNOSIS — Z79890 Hormone replacement therapy (postmenopausal): Secondary | ICD-10-CM | POA: Diagnosis not present

## 2022-04-07 DIAGNOSIS — I129 Hypertensive chronic kidney disease with stage 1 through stage 4 chronic kidney disease, or unspecified chronic kidney disease: Secondary | ICD-10-CM | POA: Diagnosis present

## 2022-04-07 DIAGNOSIS — G8191 Hemiplegia, unspecified affecting right dominant side: Secondary | ICD-10-CM | POA: Diagnosis present

## 2022-04-07 DIAGNOSIS — N179 Acute kidney failure, unspecified: Secondary | ICD-10-CM | POA: Diagnosis present

## 2022-04-07 DIAGNOSIS — E785 Hyperlipidemia, unspecified: Secondary | ICD-10-CM | POA: Diagnosis present

## 2022-04-07 DIAGNOSIS — D751 Secondary polycythemia: Secondary | ICD-10-CM | POA: Diagnosis present

## 2022-04-07 LAB — ECHOCARDIOGRAM COMPLETE
Area-P 1/2: 3.48 cm2
Calc EF: 62.6 %
S' Lateral: 3 cm
Single Plane A2C EF: 65.6 %
Single Plane A4C EF: 57.7 %

## 2022-04-07 LAB — CBC WITH DIFFERENTIAL/PLATELET
Abs Immature Granulocytes: 0.02 10*3/uL (ref 0.00–0.07)
Basophils Absolute: 0 10*3/uL (ref 0.0–0.1)
Basophils Relative: 1 %
Eosinophils Absolute: 0 10*3/uL (ref 0.0–0.5)
Eosinophils Relative: 1 %
HCT: 46.1 % (ref 39.0–52.0)
Hemoglobin: 16.3 g/dL (ref 13.0–17.0)
Immature Granulocytes: 0 %
Lymphocytes Relative: 28 %
Lymphs Abs: 1.8 10*3/uL (ref 0.7–4.0)
MCH: 30.1 pg (ref 26.0–34.0)
MCHC: 35.4 g/dL (ref 30.0–36.0)
MCV: 85.2 fL (ref 80.0–100.0)
Monocytes Absolute: 0.4 10*3/uL (ref 0.1–1.0)
Monocytes Relative: 7 %
Neutro Abs: 4.2 10*3/uL (ref 1.7–7.7)
Neutrophils Relative %: 63 %
Platelets: 207 10*3/uL (ref 150–400)
RBC: 5.41 MIL/uL (ref 4.22–5.81)
RDW: 12.7 % (ref 11.5–15.5)
WBC: 6.5 10*3/uL (ref 4.0–10.5)
nRBC: 0 % (ref 0.0–0.2)

## 2022-04-07 LAB — URINALYSIS, COMPLETE (UACMP) WITH MICROSCOPIC
Bacteria, UA: NONE SEEN
Bilirubin Urine: NEGATIVE
Glucose, UA: NEGATIVE mg/dL
Hgb urine dipstick: NEGATIVE
Ketones, ur: NEGATIVE mg/dL
Leukocytes,Ua: NEGATIVE
Nitrite: NEGATIVE
Protein, ur: NEGATIVE mg/dL
Specific Gravity, Urine: 1.043 — ABNORMAL HIGH (ref 1.005–1.030)
pH: 6 (ref 5.0–8.0)

## 2022-04-07 LAB — HEMOGLOBIN A1C
Hgb A1c MFr Bld: 6 % — ABNORMAL HIGH (ref 4.8–5.6)
Mean Plasma Glucose: 125.5 mg/dL

## 2022-04-07 LAB — LIPID PANEL
Cholesterol: 153 mg/dL (ref 0–200)
HDL: 38 mg/dL — ABNORMAL LOW (ref >40)
LDL Cholesterol: 101 mg/dL — ABNORMAL HIGH (ref 0–99)
Total CHOL/HDL Ratio: 4 RATIO
Triglycerides: 72 mg/dL (ref <150)
VLDL: 14 mg/dL (ref 0–40)

## 2022-04-07 LAB — GLUCOSE, CAPILLARY
Glucose-Capillary: 112 mg/dL — ABNORMAL HIGH (ref 70–99)
Glucose-Capillary: 136 mg/dL — ABNORMAL HIGH (ref 70–99)

## 2022-04-07 LAB — RAPID URINE DRUG SCREEN, HOSP PERFORMED
Amphetamines: NOT DETECTED
Barbiturates: NOT DETECTED
Benzodiazepines: NOT DETECTED
Cocaine: NOT DETECTED
Opiates: NOT DETECTED
Tetrahydrocannabinol: NOT DETECTED

## 2022-04-07 LAB — COMPREHENSIVE METABOLIC PANEL
ALT: 21 U/L (ref 0–44)
AST: 26 U/L (ref 15–41)
Albumin: 3.6 g/dL (ref 3.5–5.0)
Alkaline Phosphatase: 45 U/L (ref 38–126)
Anion gap: 9 (ref 5–15)
BUN: 15 mg/dL (ref 8–23)
CO2: 24 mmol/L (ref 22–32)
Calcium: 9.2 mg/dL (ref 8.9–10.3)
Chloride: 104 mmol/L (ref 98–111)
Creatinine, Ser: 1.35 mg/dL — ABNORMAL HIGH (ref 0.61–1.24)
GFR, Estimated: 58 mL/min — ABNORMAL LOW (ref >60)
Glucose, Bld: 101 mg/dL — ABNORMAL HIGH (ref 70–99)
Potassium: 3.9 mmol/L (ref 3.5–5.1)
Sodium: 137 mmol/L (ref 135–145)
Total Bilirubin: 1.9 mg/dL — ABNORMAL HIGH (ref 0.3–1.2)
Total Protein: 6.4 g/dL — ABNORMAL LOW (ref 6.5–8.1)

## 2022-04-07 LAB — TSH: TSH: 1.411 u[IU]/mL (ref 0.350–4.500)

## 2022-04-07 LAB — MAGNESIUM: Magnesium: 1.9 mg/dL (ref 1.7–2.4)

## 2022-04-07 MED ORDER — HEPARIN SODIUM (PORCINE) 5000 UNIT/ML IJ SOLN
5000.0000 [IU] | Freq: Three times a day (TID) | INTRAMUSCULAR | Status: DC
  Administered 2022-04-07 – 2022-04-08 (×3): 5000 [IU] via SUBCUTANEOUS
  Filled 2022-04-07 (×3): qty 1

## 2022-04-07 MED ORDER — ASPIRIN EC 81 MG PO TBEC
81.0000 mg | DELAYED_RELEASE_TABLET | Freq: Every day | ORAL | Status: DC
  Administered 2022-04-07 – 2022-04-08 (×2): 81 mg via ORAL
  Filled 2022-04-07 (×2): qty 1

## 2022-04-07 MED ORDER — ROSUVASTATIN CALCIUM 20 MG PO TABS
20.0000 mg | ORAL_TABLET | Freq: Every day | ORAL | Status: DC
  Administered 2022-04-07 – 2022-04-08 (×2): 20 mg via ORAL
  Filled 2022-04-07 (×2): qty 1

## 2022-04-07 MED ORDER — PERFLUTREN LIPID MICROSPHERE
1.0000 mL | INTRAVENOUS | Status: AC | PRN
  Administered 2022-04-07: 2 mL via INTRAVENOUS
  Filled 2022-04-07: qty 10

## 2022-04-07 MED ORDER — VITAMIN B-12 1000 MCG PO TABS
1000.0000 ug | ORAL_TABLET | Freq: Every day | ORAL | Status: DC
  Administered 2022-04-07 – 2022-04-08 (×2): 1000 ug via ORAL
  Filled 2022-04-07 (×2): qty 1

## 2022-04-07 MED ORDER — SODIUM CHLORIDE 0.9 % IV SOLN
12.5000 mg | Freq: Three times a day (TID) | INTRAVENOUS | Status: DC | PRN
  Filled 2022-04-07 (×3): qty 0.5

## 2022-04-07 MED ORDER — LACTATED RINGERS IV SOLN: INTRAVENOUS | Status: DC

## 2022-04-07 NOTE — Progress Notes (Signed)
Received pt on the unit. Pt oriented to unit bed locked at the lowest position call bell within reach. Vitals obtained. ? ?

## 2022-04-07 NOTE — Evaluation (Signed)
Speech Language Pathology Evaluation ?Patient Details ?Name: ANDRAE CLAUNCH ?MRN: [id number] ?DOB: 08-02-1956 ?Today's Date: 04/07/2022 ?Time: 1425-1450 ?SLP Time Calculation (min) (ACUTE ONLY): 25 min ? ?Problem List:  ?Patient Active Problem List  ? Diagnosis Date Noted  ? Acute ischemic stroke (HCC) 04/06/2022  ? Sinus bradycardia 04/06/2022  ? Erythrocytosis 04/06/2022  ? Acquired hypothyroidism 04/06/2022  ? B12 deficiency 01/07/2018  ? Hypothyroidism following radioiodine therapy 09/25/2016  ? Elevated PSA 05/03/2016  ? Numbness 03/22/2016  ? HTN (hypertension) 11/22/2015  ? Bilateral hearing loss 11/22/2015  ? Tinnitus 11/22/2015  ? ?Past Medical History:  ?Past Medical History:  ?Diagnosis Date  ? Hyperthyroidism   ? ?Past Surgical History: History reviewed. No pertinent surgical history. ?HPI:  ?Patient is a  66 y.o. male presented from home complaining of right-sided numbness. MRI: Small focus of acute ischemia within the left paramedian pons. No hemorrhage or mass effect; Findings of chronic small vessel ischema.PMH: essential hypertension, acquired hypothyroidism following radioactive iodine therapy for Graves' disease, prediabetes.  ? ?Assessment / Plan / Recommendation ?Clinical Impression ? Patient presents with a very mild flaccid dysarthria which only slightly impacts the quality of his speech articulation but no significant impact on speech intelligibility. Patient reported that initially, his speech seemed like "mumbling" and "slurring and yesterday (04/06/22) he was having difficulty "forming certain vowel sounds". 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 (we discussed how this can cause stress as well as neglect of taking care of self). During oral motor exam, SLP observed mild decreased ROM of right side of mouth with decreased labial closure leading to trace amount of anterior spillage of saliva. Assymetry only  noticeable when looking for it but otherwise, patient with no obvious symptoms. As patient's speech is not significantly impacting his overall intelligibility and is expected to continue to improve over time without direct intervention, SLP is not recommending skilled therapeutic intervention at this time. SLP educated patient regarding stroke prevention, information of location of stroke and expected symptoms, etc. Patient was observed to have hiccups and MD informed as these started at same time as CVA and can be a symptom. ?   ?SLP Assessment ? SLP Recommendation/Assessment: Patient does not need any further Speech Lanaguage Pathology Services  ?  ?Recommendations for follow up therapy are one component of a multi-disciplinary discharge planning process, led by the attending physician.  Recommendations may be updated based on patient status, additional functional criteria and insurance authorization. ?   ?Follow Up Recommendations ? No SLP follow up  ?  ?Assistance Recommended at Discharge ? None  ?Functional Status Assessment Patient has had a recent decline in their functional status and demonstrates the ability to make significant improvements in function in a reasonable and predictable amount of time.  ?Frequency and Duration    ?  ?  ?   ?SLP Evaluation ?Cognition ? Overall Cognitive Status: Within Functional Limits for tasks assessed ?Arousal/Alertness: Awake/alert ?Orientation Level: Oriented X4 ?Memory: Appears intact ?Awareness: Appears intact ?Problem Solving: Appears intact ?Safety/Judgment: Appears intact  ?  ?   ?Comprehension ? Auditory Comprehension ?Overall Auditory Comprehension: Appears within functional limits for tasks assessed  ?  ?Expression Expression ?Primary Mode of Expression: Verbal ?Verbal Expression ?Overall Verbal Expression: Appears within functional limits for tasks assessed ?Written Expression ?Dominant Hand: Right   ?Oral / Motor ? Oral Motor/Sensory Function ?Overall Oral  Motor/Sensory Function: Mild impairment ?Facial ROM: Reduced right ?  Facial Symmetry: Abnormal symmetry right ?Facial Strength: Reduced right ?Facial Sensation: Within Functional Limits ?Lingual ROM: Within Functional Limits ?Lingual Symmetry: Within Functional Limits ?Lingual Strength: Within Functional Limits ?Lingual Sensation: Within Functional Limits ?Velum: Within Functional Limits ?Mandible: Within Functional Limits ?Motor Speech ?Overall Motor Speech: Impaired ?Respiration: Within functional limits ?Phonation: Normal ?Resonance: Within functional limits ?Articulation: Impaired ?Level of Impairment: Conversation ?Intelligibility: Intelligible ?Motor Planning: Witnin functional limits ?Motor Speech Errors: Not applicable   ?     ? ?Angela Nevin, MA, CCC-SLP ?Speech Therapy ? ? ? ?

## 2022-04-07 NOTE — Evaluation (Signed)
Physical Therapy Evaluation and Discharge ?Patient Details ?Name: Ivan Lawson ?MRN: [id number] ?DOB: [birth year] ?Today's Date: 04/07/2022 ? ?History of Present Illness ? BRAM HOTTEL is a 66 y.o. male presented from home complaining of right-sided numbness. MRI: Small focus of acute ischemia within the left paramedian pons. No  hemorrhage or mass effect; Findings of chronic small vessel ischema.PHMx:essential hypertension, acquired hypothyroidism following radioactive iodine therapy for Graves' disease, prediabetes.  ?Clinical Impression ? Patient evaluated by Physical Therapy with no further acute PT needs identified. Pt reports mild difficulty with fine motor tasks using right hand, but stating he feels balance and gait have improved significantly since admission. Pt overall appears close to his functional baseline. Ambulating 500 ft with no assistive device modI. Scoring 20/24 on the Dynamic Gait Index, indicating he is not at high risk for falls. Education provided regarding BEFAST stroke symptoms and general exercise recommendations. All education has been completed and the patient has no further questions. No follow-up Physical Therapy or equipment needs. PT is signing off. Thank you for this referral. ? ?   ? ?Recommendations for follow up therapy are one component of a multi-disciplinary discharge planning process, led by the attending physician.  Recommendations may be updated based on patient status, additional functional criteria and insurance authorization. ? ?Follow Up Recommendations No PT follow up ? ?  ?Assistance Recommended at Discharge Intermittent Supervision/Assistance  ?Patient can return home with the following ?   ? ?  ?Equipment Recommendations None recommended by PT  ?Recommendations for Other Services ?    ?  ?Functional Status Assessment Patient has had a recent decline in their functional status and demonstrates the ability to make significant improvements in function in a reasonable  and predictable amount of time.  ? ?  ?Precautions / Restrictions Precautions ?Precautions: None ?Restrictions ?Weight Bearing Restrictions: No  ? ?  ? ?Mobility ? Bed Mobility ?Overal bed mobility: Modified Independent ?  ?  ?  ?  ?  ?  ?  ?  ? ?Transfers ?Overall transfer level: Independent ?Equipment used: None ?  ?  ?  ?  ?  ?  ?  ?  ?  ? ?Ambulation/Gait ?Ambulation/Gait assistance: Modified independent (Device/Increase time) ?Gait Distance (Feet): 500 Feet ?Assistive device: None ?Gait Pattern/deviations: WFL(Within Functional Limits) ?Gait velocity: 2.1 ft/s ?Gait velocity interpretation: 1.31 - 2.62 ft/sec, indicative of limited community ambulator ?  ?General Gait Details: Mildly slower gait speed for age, which pt reports is baseline. Pt states he is able to bear more weight through R foot now than prior. No gait abnormalities noted. Mild compensatory techniques for DGI tasks ? ?Stairs ?  ?  ?  ?  ?  ? ?Wheelchair Mobility ?  ? ?Modified Rankin (Stroke Patients Only) ?Modified Rankin (Stroke Patients Only) ?Pre-Morbid Rankin Score: No symptoms ?Modified Rankin: No significant disability ? ?  ? ?Balance Overall balance assessment: Mild deficits observed, not formally tested ?  ?  ?  ?  ?  ?  ?  ?  ?  ?  ?  ?  ?  ?  ?  ?Standardized Balance Assessment ?Standardized Balance Assessment : Dynamic Gait Index ?  ?Dynamic Gait Index ?Level Surface: Mild Impairment ?Change in Gait Speed: Normal ?Gait with Horizontal Head Turns: Normal ?Gait with Vertical Head Turns: Normal ?Gait and Pivot Turn: Mild Impairment ?Step Over Obstacle: Mild Impairment ?Step Around Obstacles: Normal ?Steps: Mild Impairment ?Total Score: 20 ?   ? ? ? ?Pertinent Vitals/Pain Pain Assessment ?  Pain Assessment: No/denies pain  ? ? ?Home Living Family/patient expects to be discharged to:: Private residence ?Living Arrangements: Alone ?Available Help at Discharge: Family;Available 24 hours/day (brother lives close by) ?Type of Home: House ?Home  Access: Stairs to enter ?Entrance Stairs-Rails: Right;Left ?Entrance Stairs-Number of Steps: 6 ?  ?Home Layout: One level ?Home Equipment: Agricultural consultant (2 wheels);Cane - single point;Wheelchair - manual (mother's DME) ?   ?  ?Prior Function Prior Level of Function : Independent/Modified Independent;Driving ?  ?  ?  ?  ?  ?  ?Mobility Comments: Retired ?  ?  ? ? ?Hand Dominance  ? Dominant Hand: Right ? ?  ?Extremity/Trunk Assessment  ? Upper Extremity Assessment ?Upper Extremity Assessment: Defer to OT evaluation ?  ? ?Lower Extremity Assessment ?Lower Extremity Assessment: RLE deficits/detail;LLE deficits/detail ?RLE Deficits / Details: Strength 5/5 ?LLE Deficits / Details: Strength 5/5 ?  ? ?Cervical / Trunk Assessment ?Cervical / Trunk Assessment: Normal  ?Communication  ? Communication: HOH  ?Cognition Arousal/Alertness: Awake/alert ?Behavior During Therapy: [facility] for tasks assessed/performed ?Overall Cognitive Status: Within Functional Limits for tasks assessed ?  ?  ?  ?  ?  ?  ?  ?  ?  ?  ?  ?  ?  ?  ?  ?  ?  ?  ?  ? ?  ?General Comments   ? ?  ?Exercises    ? ?Assessment/Plan  ?  ?PT Assessment Patient does not need any further PT services  ?PT Problem List   ? ?   ?  ?PT Treatment Interventions     ? ?PT Goals (Current goals can be found in the Care Plan section)  ?Acute Rehab PT Goals ?Patient Stated Goal: to return to baseline ?PT Goal Formulation: All assessment and education complete, DC therapy ? ?  ?Frequency   ?  ? ? ?Co-evaluation   ?  ?  ?  ?  ? ? ?  ?AM-PAC PT "6 Clicks" Mobility  ?Outcome Measure Help needed turning from your back to your side while in a flat bed without using bedrails?: None ?Help needed moving from lying on your back to sitting on the side of a flat bed without using bedrails?: None ?Help needed moving to and from a bed to a chair (including a wheelchair)?: None ?Help needed standing up from a chair using your arms (e.g., wheelchair or bedside chair)?: None ?Help needed to walk  in hospital room?: None ?Help needed climbing 3-5 steps with a railing? : None ?6 Click Score: 24 ? ?  ?End of Session   ?Activity Tolerance: Patient tolerated treatment well ?Patient left: in bed;with call bell/phone within reach ?Nurse Communication: Mobility status ?PT Visit Diagnosis: Difficulty in walking, not elsewhere classified (R26.2) ?  ? ?Time: 7353-2992 ?PT Time Calculation (min) (ACUTE ONLY): 22 min ? ? ?Charges:   PT Evaluation ?$PT Eval Low Complexity: 1 Low ?  ?  ?   ? ? ?Lillia Pauls, PT, DPT ?Acute Rehabilitation Services ?Pager [phone] ?Office [phone] ? ? ?Norval Morton ?04/07/2022, 10:09 AM ? ?

## 2022-04-07 NOTE — Progress Notes (Signed)
?  Echocardiogram ?2D Echocardiogram has been performed. ? ?Janalyn Harder ?04/07/2022, 8:44 AM ?

## 2022-04-07 NOTE — Progress Notes (Signed)
?                                  PROGRESS NOTE                                             ?                                                                                                                     ?                                         ? ? Patient Demographics:  ? ? Ivan Lawson, is a 66 y.o. male, DOB - July 10, 1956, [id number] ? ?Outpatient Primary MD for the patient is Gaynelle Arabian, MD    LOS - 0  Admit date - 04/06/2022   ? ?Chief Complaint  ?Patient presents with  ? Stroke Like Symptoms  ?    ? ?Brief Narrative (HPI from H&P)    66 y.o. male with a PMHx of hyperthyroidism who presented to the ED this evening with a c/c of new onset numbness to the fingers of his right hand as well as the toes of his right foot on Saturday morning, his work-up was suggestive of acute stroke along with possible  AKI ? ? Subjective:  ? ? Koltin Life today has, No headache, No chest pain, No abdominal pain - No Nausea, No new weakness tingling or numbness, no SOB ? ? Assessment  & Plan :  ? ? ?Small focus of acute ischemia within the left paramedian pons with Right-sided numbness/paresthesias with dysarthria: He will undergo full stroke work-up, neurology on board, symptoms improving, currently on aspirin.  Await A1c and LDL.  Await neurology input. ? ?Carotid artery stenosis.  Secondary prevention as above, outpatient follow-up with vascular surgery directed by PCP. ? ?Acquired hypothyroidism - on Synthroid, check TSH ? ?Erythrocytosis: No baseline available could be due to dehydration as he has not been eating or drinking well for the last few days, gently hydrate and monitor.  Outpatient follow-up with PCP ? ?Sinus bradycardia: At rest with stable blood pressure, check TSH and monitor.  Not on any rate controlling agents. ? ?Essential Hypertension: Allow for permissive hypertension due to stroke. ? ?AKI and dehydration.  Hold home dose HCTZ, hydrate and monitor.  Note we do  not have a baseline and he might have underlying CKD but not clear at this time. ? ?   ? ?Condition - Fair ? ?Family Communication  :  Son bedside ? ?Code Status :  Full ? ?Consults  :  Neuro ? ?PUD Prophylaxis :   ? ? Procedures  :    ? ?  TTE - ? ?MRI - 1. Small focus of acute ischemia within the left paramedian pons. No hemorrhage or mass effect. 2. Findings of chronic small vessel ischemia.  ? ?CTA - No intracranial large vessel occlusion. No flow limiting proximal stenosis. 50% left PCA P1 stenosis. Primary anterior circulation supply to the posterior cerebral arteries. Atherosclerotic change at both carotid bifurcations, left more than right, but without stenosis by NASCET criteria. Atherosclerotic calcification in the carotid siphon regions with 30-50% stenosis in the distal siphon region on both sides. ? ?   ? ?Disposition Plan  :   ? ?Status is: Observation ? ?DVT Prophylaxis  :   ? ?SCDs Start: 04/06/22 1938 ?  ? ?Lab Results  ?Component Value Date  ? PLT 254 04/06/2022  ? ? ?Diet :  ?Diet Order   ? ?       ?  Diet regular Room service appropriate? Yes; Fluid consistency: Thin  Diet effective now       ?  ? ?  ?  ? ?  ?  ? ?Inpatient Medications ? ?Scheduled Meds: ? aspirin EC  81 mg Oral Daily  ? levothyroxine  125 mcg Oral QAC breakfast  ? vitamin B-12  1,000 mcg Oral Daily  ? ?Continuous Infusions: ? lactated ringers    ? ?PRN Meds:.acetaminophen **OR** acetaminophen, hydrALAZINE, ondansetron (ZOFRAN) IV, perflutren lipid microspheres (DEFINITY) IV suspension ? ?Antibiotics  :   ? ?Anti-infectives (From admission, onward)  ? ? None  ? ?  ? ? ? Time Spent in minutes  30 ? ? ?Lala Lund M.D on 04/07/2022 at 8:47 AM ? ?To page go to [email or web address]  ? ?[facility] -  Office  [phone] ? ?See all Orders from today for further details ? ? ? Objective:  ? ?Vitals:  ? 04/07/22 0530 04/07/22 0600 04/07/22 0630 04/07/22 0700  ?BP: 135/79 127/77 130/74 135/83  ?Pulse: (!) 53 (!) 44 (!) 47 (!) 47  ?Resp:  20 10 15 16   ?Temp:      ?TempSrc:      ?SpO2: 93% 97% 96% 97%  ? ? ?Wt Readings from Last 3 Encounters:  ?12/26/16 103 kg  ?05/03/16 99.8 kg  ?03/22/16 96.6 kg  ? ? ?No intake or output data in the 24 hours ending 04/07/22 0847 ? ? ?Physical Exam ? ?Awake Alert, No new F.N deficits, Normal affect ?Beecher.AT,PERRAL ?Supple Neck, No JVD,   ?Symmetrical Chest wall movement, Good air movement bilaterally, CTAB ?RRR,No Gallops,Rubs or new Murmurs,  ?+ve B.Sounds, Abd Soft, No tenderness,   ?No Cyanosis, Clubbing or edema  ?  ? ? Data Review:  ? ? ?CBC ?Recent Labs  ?Lab 04/06/22 ?1727 04/06/22 ?1801  ?WBC 10.6*  --   ?HGB 17.7* 17.3*  ?HCT 51.7 51.0  ?PLT 254  --   ?MCV 85.7  --   ?MCH 29.4  --   ?MCHC 34.2  --   ?RDW 12.7  --   ?LYMPHSABS 2.8  --   ?MONOABS 0.7  --   ?EOSABS 0.0  --   ?BASOSABS 0.0  --   ? ? ?Electrolytes ?Recent Labs  ?Lab 04/06/22 ?1727 04/06/22 ?1801  ?NA 135 138  ?K 3.8 3.7  ?CL 102 101  ?CO2 23  --   ?GLUCOSE 112* 109*  ?BUN 15 16  ?CREATININE 1.34* 1.20  ?CALCIUM 9.5  --   ?AST 28  --   ?ALT 24  --   ?ALKPHOS 52  --   ?BILITOT 1.5*  --   ?  ALBUMIN 4.2  --   ?INR 1.0  --   ? ? ?------------------------------------------------------------------------------------------------------------------ ?No results found for: CHOL, HDL, LDLCALC, LDLDIRECT, TRIG, CHOLHDL ? ?No results found for: HGBA1C ? ?------------------------------------------------------------------------------------------------------------------ ?ID Labs ?Recent Labs  ?Lab 04/06/22 ?1727 04/06/22 ?1801  ?WBC 10.6*  --   ?PLT 254  --   ?CREATININE 1.34* 1.20  ? ?Cardiac Enzymes ?No results for input(s): CKMB, TROPONINI, MYOGLOBIN in the last 168 hours. ? ?Invalid input(s): CK ? ? ?  ? ? ?Micro Results ?No results found for this or any previous visit (from the past 240 hour(s)). ? ?Radiology Reports ?CT ANGIO HEAD NECK W WO CM ? ?Result Date: 04/06/2022 ?CLINICAL DATA:  Neuro deficit, acute, stroke suspected. EXAM: CT ANGIOGRAPHY HEAD AND NECK  TECHNIQUE: Multidetector CT imaging of the head and neck was performed using the standard protocol during bolus administration of intravenous contrast. Multiplanar CT image reconstructions and MIPs were obtained to evaluate the vascular anatomy. Carotid stenosis measurements (when applicable) are obtained utilizing NASCET criteria, using the distal internal carotid diameter as the denominator. RADIATION DOSE REDUCTION: This exam was performed according to the departmental dose-optimization program which includes automated exposure control, adjustment of the mA and/or kV according to patient size and/or use of iterative reconstruction technique. CONTRAST:  22mL OMNIPAQUE IOHEXOL 350 MG/ML SOLN COMPARISON:  Head CT same day FINDINGS: CTA NECK FINDINGS Aortic arch: Minimal aortic atherosclerosis. Branching pattern is normal. No origin stenosis. Right carotid system: Common carotid artery widely patent to the bifurcation. Minimal plaque at the carotid bifurcation and ICA bulb but no stenosis. Cervical ICA widely patent. Left carotid system: Common carotid artery widely patent to the bifurcation. Calcified plaque at the carotid bifurcation and ICA bulb. No stenosis compared to the more distal cervical ICA however. Distal cervical ICA is normal. Vertebral arteries: Right vertebral artery is dominant. Left vertebral artery takes an early origin from the subclavian. No vertebral artery origin stenosis on either side. Both vessels are patent through the cervical region to the foramen magnum. Skeleton: Mild spondylosis. Other neck: No mass or lymphadenopathy.  Lung apices are clear. Upper chest: Lung apices are clear. Review of the MIP images confirms the above findings CTA HEAD FINDINGS Anterior circulation: Both internal carotid arteries are patent through the skull base and siphon regions. There is siphon atherosclerotic calcification with stenosis estimated at 30-50% in the distal siphon region on both sides. The anterior  and middle cerebral vessels are patent without large vessel occlusion or correctable proximal stenosis. No aneurysm or vascular malformation. Posterior circulation: Both vertebral arteries are patent thro

## 2022-04-07 NOTE — ED Notes (Signed)
Breakfast order placed ?

## 2022-04-07 NOTE — Care Management (Signed)
OT recommending outpatient therapy services Referral sent to [facility]. Placed on patient instructions ?

## 2022-04-07 NOTE — Care Management Obs Status (Signed)
MEDICARE OBSERVATION STATUS NOTIFICATION ? ? ?Patient Details  ?Name: Ivan Lawson ?MRN: [id number] ?Date of Birth: 03-02-1956 ? ? ?Medicare Observation Status Notification Given:  Yes ? ?Verbal permission to sign ? ?Lockie Pares, RN ?04/07/2022, 2:57 PM ?

## 2022-04-07 NOTE — Progress Notes (Signed)
Occupational Therapy Treatment and Discharge ?Patient Details ?Name: Ivan Lawson ?MRN: [id number] ?DOB: 11/24/1956 ?Today's Date: 04/07/2022 ? ? ?History of present illness Ivan Lawson is a 66 y.o. male presented from home complaining of right-sided numbness. MRI: Small focus of acute ischemia within the left paramedian pons. No  hemorrhage or mass effect; Findings of chronic small vessel ischema.PHMx:essential hypertension, acquired hypothyroidism following radioactive iodine therapy for Graves' disease, prediabetes. ?  ?OT comments ? Pt seen for a second time today to go over and give him some FM activities to do as homework until he can get in to see OPOT. No further acute OT needs, we will D/C him.  ? ?Recommendations for follow up therapy are one component of a multi-disciplinary discharge planning process, led by the attending physician.  Recommendations may be updated based on patient status, additional functional criteria and insurance authorization. ?   ?Follow Up Recommendations ? Outpatient OT  ?  ?Assistance Recommended at Discharge None  ?Patient can return home with the following ? Assist for transportation ?  ?Equipment Recommendations ? None recommended by OT  ?  ?   ?Precautions / Restrictions Precautions ?Precautions: None ?Restrictions ?Weight Bearing Restrictions: No  ? ? ?  ? ?Mobility Bed Mobility ?Overal bed mobility: Modified Independent ?  ?  ?  ?  ?  ?  ?  ?  ? ?Transfers ?Overall transfer level: Independent ?Equipment used: None ?  ?  ?  ?  ?  ?  ?  ?  ?  ?  ?Balance Overall balance assessment: Mild deficits observed, not formally tested ?  ?  ?  ?  ?  ?  ?  ?  ?  ?  ?  ?  ?  ?  ?  ?  ?  ?  ?   ? ?ADL either performed or assessed with clinical judgement  ? ?ADL Overall ADL's : Modified independent ?  ?  ?  ?  ?  ?  ?  ?  ?  ?  ?  ?  ?  ?  ?  ?  ?  ?  ?  ?  ?  ? ? ? ?Cognition Arousal/Alertness: Awake/alert ?Behavior During Therapy: [facility] for tasks assessed/performed ?Overall Cognitive  Status: Within Functional Limits for tasks assessed ?  ?  ?  ?  ?  ?  ?  ?  ?  ?  ?  ?  ?  ?  ?  ?  ?General Comments: HOH ?  ?  ?   ?Exercises Other Exercises ?Other Exercises: Provided pt with handouts on FM activities, theraputty activities, word searches, mazes, dot-to-dots, red theraputty, clothespins, deck of cards, crayons, and a marker. Went over handouts with patient and recommended he do 5 different activities 3 times a day until her can get set up with OP OT. ? ?  ?   ?   ? ? ?Pertinent Vitals/ Pain       Pain Assessment ?Pain Assessment: No/denies pain ? ?Home Living Family/patient expects to be discharged to:: Private residence ?Living Arrangements: Alone ?Available Help at Discharge: Family;Available 24 hours/day (brother lives close by as well as niece) ?Type of Home: House ?Home Access: Stairs to enter ?Entrance Stairs-Number of Steps: 6 ?Entrance Stairs-Rails: Right;Left ?Home Layout: One level ?  ?  ?Bathroom Shower/Tub: Tub/shower unit;Walk-in shower (uses tub/shower) ?  ?Bathroom Toilet:  (both, uses standard) ?  ?  ?Home Equipment: Conservation officer, nature (2 wheels);Cane - single point;Wheelchair -  manual (mother's DME) ?  ?  ?  ? ?  ?   ? ?Frequency ? Min 2X/week  ? ? ? ? ?  ?Progress Toward Goals ? ?OT Goals(current goals can now be found in the care plan section) ? Progress towards OT goals: Goals met/education completed, patient discharged from OT ? ?Acute Rehab OT Goals ?Patient Stated Goal: to get some rest ?OT Goal Formulation: With patient ?Time For Goal Achievement: 04/21/22 ?Potential to Achieve Goals: Good ?ADL Goals ?Pt/caregiver will Perform Home Exercise Program: Increased strength;Increased ROM;Right Upper extremity;With written HEP provided;Independently (increase coordination)  ?Plan Discharge plan remains appropriate   ? ?   ?AM-PAC OT "6 Clicks" Daily Activity     ?Outcome Measure ? ? Help from another person eating meals?: None ?Help from another person taking care of personal  grooming?: None ?Help from another person toileting, which includes using toliet, bedpan, or urinal?: None ?Help from another person bathing (including washing, rinsing, drying)?: None ?Help from another person to put on and taking off regular upper body clothing?: None ?Help from another person to put on and taking off regular lower body clothing?: None ?6 Click Score: 24 ? ?  ?End of Session   ? ?OT Visit Diagnosis: Muscle weakness (generalized) (M62.81);Hemiplegia and hemiparesis ?Hemiplegia - Right/Left: Right ?Hemiplegia - dominant/non-dominant: Dominant ?Hemiplegia - caused by: Cerebral infarction ?  ?Activity Tolerance Patient tolerated treatment well ?  ?Patient Left  (sitting EOB eating lunch) ?  ?   ? ?   ? ?Time: 7035-0093 ?OT Time Calculation (min): 21 min ? ?Charges: OT General Charges ?$OT Visit: 1 Visit ?OT Evaluation ?$OT Eval Moderate Complexity: 1 Mod ?OT Treatments ?$Self Care/Home Management : 8-22 mins ?$Therapeutic Activity: 8-22 mins ?Golden Circle, OTR/L ?Acute Rehab Services ?Pager [phone] ?Office [phone] ? ? ? ?Almon Register ?04/07/2022, 1:47 PM ?

## 2022-04-07 NOTE — Evaluation (Signed)
Occupational Therapy Evaluation ?Patient Details ?Name: Ivan Lawson ?MRN: [id number] ?DOB: 10-06-1956 ?Today's Date: 04/07/2022 ? ? ?History of Present Illness Ivan Lawson is a 66 y.o. male presented from home complaining of right-sided numbness. MRI: Small focus of acute ischemia within the left paramedian pons. No  hemorrhage or mass effect; Findings of chronic small vessel ischema.PHMx:essential hypertension, acquired hypothyroidism following radioactive iodine therapy for Graves' disease, prediabetes.  ? ?Clinical Impression ?  ?This 66 yo male admitted with above presents to acute OT with mild decreased use of his RUE (dominant). He will benefit from one more session of acute OT with follow up at OPOT.  ?   ? ?Recommendations for follow up therapy are one component of a multi-disciplinary discharge planning process, led by the attending physician.  Recommendations may be updated based on patient status, additional functional criteria and insurance authorization.  ? ?Follow Up Recommendations ? Outpatient OT  ?  ?Assistance Recommended at Discharge None  ?Patient can return home with the following Assist for transportation ? ?  ?Functional Status Assessment ? Patient has had a recent decline in their functional status and demonstrates the ability to make significant improvements in function in a reasonable and predictable amount of time.  ?Equipment Recommendations ? None recommended by OT  ?  ?   ?Precautions / Restrictions Precautions ?Precautions: None ?Restrictions ?Weight Bearing Restrictions: No  ? ?  ? ?Mobility Bed Mobility ?Overal bed mobility: Modified Independent ?  ?  ?  ?  ?  ?  ?  ?  ? ?Transfers ?Overall transfer level: Independent ?Equipment used: None ?  ?  ?  ?  ?  ?  ?  ?  ?  ? ?  ?Balance Overall balance assessment: Mild deficits observed, not formally tested ?  ?  ?  ?  ?  ?  ?  ?  ?  ?  ?  ?  ?  ?  ?  ?  ?  ?  ?   ? ?ADL either performed or assessed with clinical judgement  ? ?ADL  Overall ADL's : Modified independent ?  ?  ?  ?  ?  ?  ?  ?  ?  ?  ?  ?  ?  ?  ?  ?  ?  ?  ?  ?   ? ? ? ?Vision Baseline Vision/History: 1 Wears glasses (reading) ?Ability to See in Adequate Light: 0 Adequate ?Patient Visual Report: No change from baseline ?Vision Assessment?: Yes ?Eye Alignment: Within Functional Limits ?Ocular Range of Motion: Within Functional Limits ?Alignment/Gaze Preference: Within Defined Limits ?Tracking/Visual Pursuits: Able to track stimulus in all quads without difficulty ?Saccades: Additional eye shifts occurred during testing;Additional head turns occurred during testing ?Convergence: Within functional limits ?Visual Fields: No apparent deficits  ?   ?   ?   ? ?Pertinent Vitals/Pain Pain Assessment ?Pain Assessment: No/denies pain  ? ? ? ?Hand Dominance Right ?  ?Extremity/Trunk Assessment Upper Extremity Assessment ?Upper Extremity Assessment: RUE deficits/detail ?RUE Deficits / Details: 4/5 strength; pt reports writing his name and numbers I had him do is better today than yesterday but still not "normal" ?RUE Sensation: decreased light touch ?RUE Coordination: decreased fine motor;decreased gross motor ?  ? ?  ?Communication Communication ?Communication: HOH ?  ?Cognition Arousal/Alertness: Awake/alert ?Behavior During Therapy: Ivan Lawson for tasks assessed/performed ?Overall Cognitive Status: Within Functional Limits for tasks assessed ?  ?  ?  ?  ?  ?  ?  ?  ?  ?  ?  ?  ?  ?  ?  ?  ?  ?  ?  ?   ?   ?   ? ? ?  Home Living Family/patient expects to be discharged to:: Private residence ?Living Arrangements: Alone ?Available Help at Discharge: Family;Available 24 hours/day (brother lives close by as well as niece) ?Type of Home: House ?Home Access: Stairs to enter ?Entrance Stairs-Number of Steps: 6 ?Entrance Stairs-Rails: Right;Left ?Home Layout: One level ?  ?  ?Bathroom Shower/Tub: Tub/shower unit;Walk-in shower (uses tub/shower) ?  ?Bathroom Toilet:  (both, uses standard) ?  ?  ?Home  Equipment: Agricultural consultant (2 wheels);Cane - single point;Wheelchair - manual (mother's DME) ?  ?  ?  ? ?  ?Prior Functioning/Environment Prior Level of Function : Independent/Modified Independent;Driving ?  ?  ?  ?  ?  ?  ?Mobility Comments: Retired ?  ?  ? ?  ?  ?OT Problem List: Decreased strength;Impaired UE functional use;Impaired sensation ?  ?   ?OT Treatment/Interventions: Therapeutic activities;Therapeutic exercise  ?  ?OT Goals(Current goals can be found in the care plan section) Acute Rehab OT Goals ?Patient Stated Goal: to get full use of his right arm back ?OT Goal Formulation: With patient ?Time For Goal Achievement: 04/21/22 ?Potential to Achieve Goals: Good  ?OT Frequency: Min 2X/week ?  ? ?   ?AM-PAC OT "6 Clicks" Daily Activity     ?Outcome Measure Help from another person eating meals?: None ?Help from another person taking care of personal grooming?: None ?Help from another person toileting, which includes using toliet, bedpan, or urinal?: None ?Help from another person bathing (including washing, rinsing, drying)?: None ?Help from another person to put on and taking off regular upper body clothing?: None ?Help from another person to put on and taking off regular lower body clothing?: None ?6 Click Score: 24 ?  ?End of Session   ? ?Activity Tolerance: Patient tolerated treatment well ?Patient left: in bed;with call bell/phone within reach;with family/visitor present (niece) ? ?OT Visit Diagnosis: Muscle weakness (generalized) (M62.81);Hemiplegia and hemiparesis ?Hemiplegia - Right/Left: Right ?Hemiplegia - dominant/non-dominant: Dominant ?Hemiplegia - caused by: Cerebral infarction  ?              ?Time: 0981-1914 ?OT Time Calculation (min): 39 min ?Charges:  OT General Charges ?$OT Visit: 1 Visit ?OT Evaluation ?$OT Eval Moderate Complexity: 1 Mod ?OT Treatments ?$Self Care/Home Management : 8-22 mins ?$Therapeutic Activity: 8-22 mins ? ?Ivan Lawson, OTR/L ?Acute Rehab Services ?Pager  [phone] ?Office [phone] ? ? ? ?Ivan Lawson ?04/07/2022, 12:28 PM ?

## 2022-04-07 NOTE — Progress Notes (Addendum)
STROKE TEAM PROGRESS NOTE  ? ?INTERVAL HISTORY ?No family at the bedside. Patient states balance and numbness is improving significantly. Speech is still dysarthric, but improving. Consider 30 day heart monitor.  ? ?Vitals:  ? 04/07/22 0900 04/07/22 0930 04/07/22 1100 04/07/22 1200  ?BP: 133/73 129/81 (!) 130/92 121/70  ?Pulse: (!) 49 (!) 53 (!) 59 (!) 51  ?Resp: 13 15 14 15   ?Temp:      ?TempSrc:      ?SpO2: 96% 98% 99% 93%  ? ?CBC:  ?Recent Labs  ?Lab 04/06/22 ?1727 04/06/22 ?1801 04/07/22 ?0900  ?WBC 10.6*  --  6.5  ?NEUTROABS 7.0  --  4.2  ?HGB 17.7* 17.3* 16.3  ?HCT 51.7 51.0 46.1  ?MCV 85.7  --  85.2  ?PLT 254  --  207  ? ?Basic Metabolic Panel:  ?Recent Labs  ?Lab 04/06/22 ?1727 04/06/22 ?1801 04/07/22 ?0900  ?NA 135 138 137  ?K 3.8 3.7 3.9  ?CL 102 101 104  ?CO2 23  --  24  ?GLUCOSE 112* 109* 101*  ?BUN 15 16 15   ?CREATININE 1.34* 1.20 1.35*  ?CALCIUM 9.5  --  9.2  ?MG  --   --  1.9  ? ?Lipid Panel:  ?Recent Labs  ?Lab 04/07/22 ?0900  ?CHOL 153  ?TRIG 72  ?HDL 38*  ?CHOLHDL 4.0  ?VLDL 14  ?LDLCALC 101*  ? ?HgbA1c:  ?Recent Labs  ?Lab 04/07/22 ?0900  ?HGBA1C 6.0*  ? ?Urine Drug Screen:  ?Recent Labs  ?Lab 04/07/22 ?0335  ?LABOPIA NONE DETECTED  ?COCAINSCRNUR NONE DETECTED  ?LABBENZ NONE DETECTED  ?AMPHETMU NONE DETECTED  ?THCU NONE DETECTED  ?LABBARB NONE DETECTED  ?  ?Alcohol Level No results for input(s): ETH in the last 168 hours. ? ?IMAGING past 24 hours ?CT ANGIO HEAD NECK W WO CM ? ?Result Date: 04/06/2022 ?CLINICAL DATA:  Neuro deficit, acute, stroke suspected. EXAM: CT ANGIOGRAPHY HEAD AND NECK TECHNIQUE: Multidetector CT imaging of the head and neck was performed using the standard protocol during bolus administration of intravenous contrast. Multiplanar CT image reconstructions and MIPs were obtained to evaluate the vascular anatomy. Carotid stenosis measurements (when applicable) are obtained utilizing NASCET criteria, using the distal internal carotid diameter as the denominator. RADIATION DOSE  REDUCTION: This exam was performed according to the departmental dose-optimization program which includes automated exposure control, adjustment of the mA and/or kV according to patient size and/or use of iterative reconstruction technique. CONTRAST:  75mL OMNIPAQUE IOHEXOL 350 MG/ML SOLN COMPARISON:  Head CT same day FINDINGS: CTA NECK FINDINGS Aortic arch: Minimal aortic atherosclerosis. Branching pattern is normal. No origin stenosis. Right carotid system: Common carotid artery widely patent to the bifurcation. Minimal plaque at the carotid bifurcation and ICA bulb but no stenosis. Cervical ICA widely patent. Left carotid system: Common carotid artery widely patent to the bifurcation. Calcified plaque at the carotid bifurcation and ICA bulb. No stenosis compared to the more distal cervical ICA however. Distal cervical ICA is normal. Vertebral arteries: Right vertebral artery is dominant. Left vertebral artery takes an early origin from the subclavian. No vertebral artery origin stenosis on either side. Both vessels are patent through the cervical region to the foramen magnum. Skeleton: Mild spondylosis. Other neck: No mass or lymphadenopathy.  Lung apices are clear. Upper chest: Lung apices are clear. Review of the MIP images confirms the above findings CTA HEAD FINDINGS Anterior circulation: Both internal carotid arteries are patent through the skull base and siphon regions. There is siphon atherosclerotic calcification with  stenosis estimated at 30-50% in the distal siphon region on both sides. The anterior and middle cerebral vessels are patent without large vessel occlusion or correctable proximal stenosis. No aneurysm or vascular malformation. Posterior circulation: Both vertebral arteries are patent through the foramen magnum. The left terminates in PICA. The right vertebral artery supplies the basilar. The basilar artery is a small vessel due to primary fetal origin of both posterior cerebral arteries. No  posterior circulation branch vessel occlusion. 50% stenosis of the left P1 segment. Venous sinuses: Patent and normal. Anatomic variants: None significant. Review of the MIP images confirms the above findings IMPRESSION: No intracranial large vessel occlusion. No flow limiting proximal stenosis. 50% left PCA P1 stenosis. Primary anterior circulation supply to the posterior cerebral arteries. Atherosclerotic change at both carotid bifurcations, left more than right, but without stenosis by NASCET criteria. Atherosclerotic calcification in the carotid siphon regions with 30-50% stenosis in the distal siphon region on both sides. Electronically Signed   By: Paulina Fusi M.D.   On: 04/06/2022 19:55  ? ?CT HEAD WO CONTRAST ? ?Result Date: 04/06/2022 ?CLINICAL DATA:  Neuro deficit, acute, stroke suspected. EXAM: CT HEAD WITHOUT CONTRAST TECHNIQUE: Contiguous axial images were obtained from the base of the skull through the vertex without intravenous contrast. RADIATION DOSE REDUCTION: This exam was performed according to the departmental dose-optimization program which includes automated exposure control, adjustment of the mA and/or kV according to patient size and/or use of iterative reconstruction technique. COMPARISON:  None Available. FINDINGS: Brain: No focal abnormality seen affecting the brainstem or cerebellum. There is mild ventriculomegaly of the lateral and third ventricles, with a differential diagnosis of central atrophy versus communicating hydrocephalus of a mild degree. Focal 9 mm low density in the corona radiata on the right could represent an acute white matter tract infarction. Cortical and subcortical low-density in the left parietal lobe could represent old or acute infarction. No evidence of hemorrhage, mass effect or extra-axial collection. Vascular: There is atherosclerotic calcification of the major vessels at the base of the brain. Skull: Negative Sinuses/Orbits: Clear/normal Other: None  IMPRESSION: 9 mm low-density in the corona radiata on the right that could be an old or recent infarction. Indistinct low-density in the left parietal cortical and subcortical brain could be old or recent infarction. Prominence of the lateral and third ventricles which could be due to central atrophy or communicating hydrocephalus. Electronically Signed   By: Paulina Fusi M.D.   On: 04/06/2022 17:56  ? ?MR BRAIN WO CONTRAST ? ?Result Date: 04/06/2022 ?CLINICAL DATA:  Right-sided numbness and tingling EXAM: MRI HEAD WITHOUT CONTRAST TECHNIQUE: Multiplanar, multiecho pulse sequences of the brain and surrounding structures were obtained without intravenous contrast. COMPARISON:  None Available. FINDINGS: Brain: Small focus of acute ischemia within the left paramedian pons. No acute or chronic hemorrhage. There is multifocal hyperintense T2-weighted signal within the white matter. Generalized cerebral volume loss. The midline structures are normal. Vascular: Major flow voids are preserved. Skull and upper cervical spine: Normal calvarium and skull base. Visualized upper cervical spine and soft tissues are normal. Sinuses/Orbits:No paranasal sinus fluid levels or advanced mucosal thickening. No mastoid or middle ear effusion. Normal orbits. IMPRESSION: 1. Small focus of acute ischemia within the left paramedian pons. No hemorrhage or mass effect. 2. Findings of chronic small vessel ischemia. Electronically Signed   By: Deatra Robinson M.D.   On: 04/06/2022 22:55  ? ?ECHOCARDIOGRAM COMPLETE ? ?Result Date: 04/07/2022 ?   ECHOCARDIOGRAM REPORT   Patient Name:  Ivan Lawson Date of Exam: 04/07/2022 Medical Rec #:  [id number]       Height:       71.0 in Accession #:    [id number]      Weight:       227.0 lb Date of Birth:  January 25, 1956       BSA:          2.225 m? Patient Age:    66 years        BP:           135/83 mmHg Patient Gender: M               HR:           45 bpm. Exam Location:  Inpatient Procedure: 2D Echo, Cardiac  Doppler, Color Doppler and Intracardiac            Opacification Agent Indications:    Stroke  History:        Patient has no prior history of Echocardiogram examinations.                 Abnormal ECG, Stroke, Arrythmi

## 2022-04-08 ENCOUNTER — Encounter: Payer: Self-pay | Admitting: *Deleted

## 2022-04-08 ENCOUNTER — Telehealth: Payer: Self-pay | Admitting: *Deleted

## 2022-04-08 ENCOUNTER — Other Ambulatory Visit: Payer: Self-pay | Admitting: Student

## 2022-04-08 ENCOUNTER — Other Ambulatory Visit (HOSPITAL_COMMUNITY): Payer: Self-pay

## 2022-04-08 DIAGNOSIS — I639 Cerebral infarction, unspecified: Secondary | ICD-10-CM

## 2022-04-08 DIAGNOSIS — I4891 Unspecified atrial fibrillation: Secondary | ICD-10-CM

## 2022-04-08 LAB — BASIC METABOLIC PANEL
Anion gap: 8 (ref 5–15)
BUN: 22 mg/dL (ref 8–23)
CO2: 26 mmol/L (ref 22–32)
Calcium: 8.7 mg/dL — ABNORMAL LOW (ref 8.9–10.3)
Chloride: 104 mmol/L (ref 98–111)
Creatinine, Ser: 1.39 mg/dL — ABNORMAL HIGH (ref 0.61–1.24)
GFR, Estimated: 56 mL/min — ABNORMAL LOW (ref >60)
Glucose, Bld: 101 mg/dL — ABNORMAL HIGH (ref 70–99)
Potassium: 3.8 mmol/L (ref 3.5–5.1)
Sodium: 138 mmol/L (ref 135–145)

## 2022-04-08 LAB — GLUCOSE, CAPILLARY
Glucose-Capillary: 106 mg/dL — ABNORMAL HIGH (ref 70–99)
Glucose-Capillary: 96 mg/dL (ref 70–99)

## 2022-04-08 LAB — MAGNESIUM: Magnesium: 1.9 mg/dL (ref 1.7–2.4)

## 2022-04-08 MED ORDER — ROSUVASTATIN CALCIUM 20 MG PO TABS
20.0000 mg | ORAL_TABLET | Freq: Every day | ORAL | 0 refills | Status: DC
  Filled 2022-04-08: qty 30, 30d supply, fill #0

## 2022-04-08 MED ORDER — AMLODIPINE BESYLATE 10 MG PO TABS: 10.0000 mg | ORAL_TABLET | Freq: Every day | ORAL | 3 refills | Status: DC

## 2022-04-08 MED ORDER — CLOPIDOGREL BISULFATE 75 MG PO TABS
75.0000 mg | ORAL_TABLET | Freq: Every day | ORAL | 0 refills | Status: DC
Start: 2022-04-08 — End: 2022-04-16
  Filled 2022-04-08: qty 20, 20d supply, fill #0

## 2022-04-08 MED ORDER — ASPIRIN 81 MG PO TBEC
81.0000 mg | DELAYED_RELEASE_TABLET | Freq: Every day | ORAL | 11 refills | Status: AC
Start: 2022-04-08 — End: ?
  Filled 2022-04-08: qty 30, 30d supply, fill #0

## 2022-04-08 MED ORDER — CLOPIDOGREL BISULFATE 75 MG PO TABS
75.0000 mg | ORAL_TABLET | Freq: Every day | ORAL | Status: DC
  Administered 2022-04-08: 75 mg via ORAL
  Filled 2022-04-08: qty 1

## 2022-04-08 MED ORDER — HYDRALAZINE HCL 50 MG PO TABS
50.0000 mg | ORAL_TABLET | Freq: Three times a day (TID) | ORAL | 0 refills | Status: DC
  Filled 2022-04-08: qty 90, 30d supply, fill #0

## 2022-04-08 NOTE — TOC Transition Note (Signed)
Transition of Care (TOC) - CM/SW Discharge Note ? ? ?Patient Details  ?Name: Ivan Lawson ?MRN: [id number] ?Date of Birth: 18-Sep-1956 ? ?Transition of Care (TOC) CM/SW Contact:  ?Pollie Friar, RN ?Phone Number: ?04/08/2022, 9:47 AM ? ? ?Clinical Narrative:    ?Recommendations are for OT not outpatient PT. CM has met with the patient and changed the outpatient facility to [facility] [address] doesn't provide OT. Patient in agreement.  ?Pt denies any issues with home medications. ?Pt states his brother will provide some intermittent supervision at home and transportation to home.  ? ? ?Final next level of care: OP Rehab ?Barriers to Discharge: No Barriers Identified ? ? ?Patient Goals and CMS Choice ?  ?  ?Choice offered to / list presented to : Patient ? ?Discharge Placement ?  ?           ?  ?  ?  ?  ? ?Discharge Plan and Services ?  ?  ?           ?  ?  ?  ?  ?  ?  ?  ?  ?  ?  ? ?Social Determinants of Health (SDOH) Interventions ?  ? ? ?Readmission Risk Interventions ?   ? View : No data to display.  ?  ?  ?  ? ? ? ? ? ?

## 2022-04-08 NOTE — Progress Notes (Signed)
Patient ID: Ivan Lawson, male   DOB: 02/28/1956, 66 y.o.   MRN: [id number] ?Patient enrolled for [employer] to ship a 30 day cardiac event monitor to his address on file. ? ?Letter with instructions mailed to patient. ? ?Appointment reminder for 06/06/22 New patient appointment with Dr. Jodelle Red at 8:20 AM mailed to patient. ?

## 2022-04-08 NOTE — Plan of Care (Signed)
?  Problem: Acute Rehab OT Goals (only OT should resolve) ?Goal: Pt/Caregiver Will Perform Home Exercise Program ?Outcome: Adequate for Discharge ?  ?Problem: Education: ?Goal: Knowledge of General Education information will improve ?Description: Including pain rating scale, medication(s)/side effects and non-pharmacologic comfort measures ?Outcome: Adequate for Discharge ?  ?Problem: Health Behavior/Discharge Planning: ?Goal: Ability to manage health-related needs will improve ?Outcome: Adequate for Discharge ?  ?Problem: Clinical Measurements: ?Goal: Ability to maintain clinical measurements within normal limits will improve ?Outcome: Adequate for Discharge ?Goal: Will remain free from infection ?Outcome: Adequate for Discharge ?Goal: Diagnostic test results will improve ?Outcome: Adequate for Discharge ?Goal: Respiratory complications will improve ?Outcome: Adequate for Discharge ?Goal: Cardiovascular complication will be avoided ?Outcome: Adequate for Discharge ?  ?Problem: Activity: ?Goal: Risk for activity intolerance will decrease ?Outcome: Adequate for Discharge ?  ?Problem: Nutrition: ?Goal: Adequate nutrition will be maintained ?Outcome: Adequate for Discharge ?  ?Problem: Coping: ?Goal: Level of anxiety will decrease ?Outcome: Adequate for Discharge ?  ?Problem: Elimination: ?Goal: Will not experience complications related to bowel motility ?Outcome: Adequate for Discharge ?Goal: Will not experience complications related to urinary retention ?Outcome: Adequate for Discharge ?  ?Problem: Pain Managment: ?Goal: General experience of comfort will improve ?Outcome: Adequate for Discharge ?  ?Problem: Safety: ?Goal: Ability to remain free from injury will improve ?Outcome: Adequate for Discharge ?  ?Problem: Skin Integrity: ?Goal: Risk for impaired skin integrity will decrease ?Outcome: Adequate for Discharge ?  ?Problem: Education: ?Goal: Knowledge of disease or condition will improve ?Outcome: Adequate for  Discharge ?Goal: Knowledge of secondary prevention will improve (SELECT ALL) ?Outcome: Adequate for Discharge ?Goal: Knowledge of patient specific risk factors will improve (INDIVIDUALIZE FOR PATIENT) ?Outcome: Adequate for Discharge ?Goal: Individualized Educational Video(s) ?Outcome: Adequate for Discharge ?  ?Problem: Coping: ?Goal: Will verbalize positive feelings about self ?Outcome: Adequate for Discharge ?Goal: Will identify appropriate support needs ?Outcome: Adequate for Discharge ?  ?Problem: Health Behavior/Discharge Planning: ?Goal: Ability to manage health-related needs will improve ?Outcome: Adequate for Discharge ?  ?Problem: Self-Care: ?Goal: Ability to participate in self-care as condition permits will improve ?Outcome: Adequate for Discharge ?Goal: Verbalization of feelings and concerns over difficulty with self-care will improve ?Outcome: Adequate for Discharge ?Goal: Ability to communicate needs accurately will improve ?Outcome: Adequate for Discharge ?  ?Problem: Nutrition: ?Goal: Dietary intake will improve ?Outcome: Adequate for Discharge ?  ?Problem: Intracerebral Hemorrhage Tissue Perfusion: ?Goal: Complications of Intracerebral Hemorrhage will be minimized ?Outcome: Adequate for Discharge ?  ?Problem: Ischemic Stroke/TIA Tissue Perfusion: ?Goal: Complications of ischemic stroke/TIA will be minimized ?Outcome: Adequate for Discharge ?  ?

## 2022-04-08 NOTE — Discharge Instructions (Signed)
Follow with Primary MD Blair Heys, MD in 7 days  ? ?Get CBC, CMP  -  checked next visit within 1 week by Primary MD  ? ?Activity: As tolerated with Full fall precautions use walker/cane & assistance as needed ? ?Disposition Home  ? ?Diet: Heart Healthy   ? ?Special Instructions: If you have smoked or chewed Tobacco  in the last 2 yrs please stop smoking, stop any regular Alcohol  and or any Recreational drug use. ? ?On your next visit with your primary care physician please Get Medicines reviewed and adjusted. ? ?Please request your Prim.MD to go over all Hospital Tests and Procedure/Radiological results at the follow up, please get all Hospital records sent to your Prim MD by signing hospital release before you go home. ? ?If you experience worsening of your admission symptoms, develop shortness of breath, life threatening emergency, suicidal or homicidal thoughts you must seek medical attention immediately by calling 911 or calling your MD immediately  if symptoms less severe. ? ?You Must read complete instructions/literature along with all the possible adverse reactions/side effects for all the Medicines you take and that have been prescribed to you. Take any new Medicines after you have completely understood and accpet all the possible adverse reactions/side effects.  ? ?  ?

## 2022-04-08 NOTE — Progress Notes (Signed)
Patient given discharge instructions and stated understanding. 

## 2022-04-08 NOTE — Discharge Summary (Signed)
Ivan Lawson GNF:[id number] DOB: 1956-01-02 DOA: 04/06/2022  PCP: Blair Heys, MD  Admit date: 04/06/2022  Discharge date: 04/08/2022  Admitted From: Home   Disposition:  Home   Recommendations for Outpatient Follow-up:   Follow up with PCP in 1-2 weeks  PCP Please obtain BMP/CBC, 2 view CXR in 1week,  (see Discharge instructions)   PCP Please follow up on the following pending results: Outpatient follow-up with neurology and vascular surgery.   Home Health: None   Equipment/Devices: None  Consultations: Neuro Discharge Condition: Stable    CODE STATUS: Full    Diet Recommendation: Heart Healthy     Chief Complaint  Patient presents with   Stroke Like Symptoms     Brief history of present illness from the day of admission and additional interim summary     66 y.o. male with a PMHx of hyperthyroidism who presented to the ED this evening with a c/c of new onset numbness to the fingers of his right hand as well as the toes of his right foot on Saturday morning, his work-up was suggestive of acute stroke along with possible  AKI                                                                 Hospital Course    Small focus of acute ischemia within the left paramedian pons with Right-sided numbness/paresthesias with dysarthria: He underwent full stroke work-up, LDL was above goal, A1c was stable, nonacute echocardiogram, carotid ultrasound showed some carotid artery disease, case discussed with stroke team in detail on 04/07/2022 plan is to discharge him on aspirin Plavix and statin combination.  Stop Plavix after 21 days the other 2 medications continue indefinitely.  PCP to monitor secondary risk factors for stroke.  Of note he is also getting a 30-day event monitor for which she will follow-up with cardiology as  well.   Carotid artery stenosis.  Secondary prevention as above, outpatient follow-up with vascular surgery directed by PCP.   Acquired hypothyroidism - on Synthroid, stable TSH.   Erythrocytosis: No baseline available could be due to dehydration as he has not been eating or drinking well for the last few days, gently hydrate and monitor.  Outpatient follow-up with PCP   Sinus bradycardia: At rest with stable blood pressure, labile TSH, was on bisoprolol which has been discontinued.  PCP to monitor.  He is also getting a 30-day event monitor   Essential Hypertension: Allow for permissive hypertension, due to resting bradycardia and renal insufficiency have discontinued bisoprolol and HCTZ combination, now placed on Norvasc along with hydralazine combination.  PCP tomorrow   AKI and dehydration versus CKD 3A.  No previous renal function in the system, was on diuretic which has been discontinued, has been adequately hydrated, renal function plateaued good urine  output no signs of uremia.  PCP to monitor may require outpatient nephrology follow-up if desired by PCP.  To obtain baseline renal function I called the PCP office [phone] on 04/08/2022 at 9:15 AM however could not reach any provider or staff.   Discharge diagnosis     Principal Problem:   Acute ischemic stroke [facility]) Active Problems:   HTN (hypertension)   Sinus bradycardia   Erythrocytosis   Acquired hypothyroidism    Discharge instructions    Discharge Instructions     Ambulatory referral to Physical Therapy   Complete by: As directed    OT evaluate and treat upper extremity and other   Diet - low sodium heart healthy   Complete by: As directed    Discharge instructions   Complete by: As directed    Follow with Primary MD Blair Heys, MD in 7 days   Get CBC, CMP  -  checked next visit within 1 week by Primary MD   Activity: As tolerated with Full fall precautions use walker/cane & assistance as  needed  Disposition Home   Diet: Heart Healthy    Special Instructions: If you have smoked or chewed Tobacco  in the last 2 yrs please stop smoking, stop any regular Alcohol  and or any Recreational drug use.  On your next visit with your primary care physician please Get Medicines reviewed and adjusted.  Please request your Prim.MD to go over all Hospital Tests and Procedure/Radiological results at the follow up, please get all Hospital records sent to your Prim MD by signing hospital release before you go home.  If you experience worsening of your admission symptoms, develop shortness of breath, life threatening emergency, suicidal or homicidal thoughts you must seek medical attention immediately by calling 911 or calling your MD immediately  if symptoms less severe.  You Must read complete instructions/literature along with all the possible adverse reactions/side effects for all the Medicines you take and that have been prescribed to you. Take any new Medicines after you have completely understood and accpet all the possible adverse reactions/side effects.   Increase activity slowly   Complete by: As directed        Discharge Medications   Allergies as of 04/08/2022   No Known Allergies      Medication List     STOP taking these medications    bisoprolol-hydrochlorothiazide 5-6.25 MG tablet Commonly known as: ZIAC       TAKE these medications    amLODipine 10 MG tablet Commonly known as: NORVASC Take 1 tablet (10 mg total) by mouth daily. Start taking on: Apr 09, 2022   aspirin 81 MG EC tablet Take 1 tablet (81 mg total) by mouth daily. Swallow whole.   clopidogrel 75 MG tablet Commonly known as: PLAVIX Take 1 tablet (75 mg total) by mouth daily.   hydrALAZINE 50 MG tablet Commonly known as: APRESOLINE Take 1 tablet (50 mg total) by mouth 3 (three) times daily. Start taking on: Apr 09, 2022   levothyroxine 137 MCG tablet Commonly known as: SYNTHROID Take 137  mcg by mouth every morning. What changed: Another medication with the same name was removed. Continue taking this medication, and follow the directions you see here.   rosuvastatin 20 MG tablet Commonly known as: CRESTOR Take 1 tablet (20 mg total) by mouth daily.   vitamin B-12 1000 MCG tablet Commonly known as: CYANOCOBALAMIN Take 1,000 mcg by mouth daily.         Follow-up Information  [facility] Follow up.   Specialty: Rehabilitation Why: Outpatient occupational therapy. they will call you to set up Contact information: [address] [id number] mc [place] [address] [phone]        Blair Heys, MD. Schedule an appointment as soon as possible for a visit in 1 week(s).   Specialty: Family Medicine Contact information: [address] [place] [address] [phone]         [facility] Follow up in 2 week(s).   Why: CVA Contact information: [address]     [address] [place] [address] [phone]        Victorino Sparrow, MD. Schedule an appointment as soon as possible for a visit in 3 week(s).   Specialty: Vascular Surgery Why: Carotid artery disease Contact information: [address] [place] [address] [phone]                 Major procedures and Radiology Reports - PLEASE review detailed and final reports thoroughly  -       CT ANGIO HEAD NECK W WO CM  Result Date: 04/06/2022 CLINICAL DATA:  Neuro deficit, acute, stroke suspected. EXAM: CT ANGIOGRAPHY HEAD AND NECK TECHNIQUE: Multidetector CT imaging of the head and neck was performed using the standard protocol during bolus administration of intravenous contrast. Multiplanar CT image reconstructions and MIPs were obtained to evaluate the vascular anatomy. Carotid stenosis measurements (when applicable) are obtained utilizing NASCET criteria, using the distal  internal carotid diameter as the denominator. RADIATION DOSE REDUCTION: This exam was performed according to the departmental dose-optimization program which includes automated exposure control, adjustment of the mA and/or kV according to patient size and/or use of iterative reconstruction technique. CONTRAST:  75mL OMNIPAQUE IOHEXOL 350 MG/ML SOLN COMPARISON:  Head CT same day FINDINGS: CTA NECK FINDINGS Aortic arch: Minimal aortic atherosclerosis. Branching pattern is normal. No origin stenosis. Right carotid system: Common carotid artery widely patent to the bifurcation. Minimal plaque at the carotid bifurcation and ICA bulb but no stenosis. Cervical ICA widely patent. Left carotid system: Common carotid artery widely patent to the bifurcation. Calcified plaque at the carotid bifurcation and ICA bulb. No stenosis compared to the more distal cervical ICA however. Distal cervical ICA is normal. Vertebral arteries: Right vertebral artery is dominant. Left vertebral artery takes an early origin from the subclavian. No vertebral artery origin stenosis on either side. Both vessels are patent through the cervical region to the foramen magnum. Skeleton: Mild spondylosis. Other neck: No mass or lymphadenopathy.  Lung apices are clear. Upper chest: Lung apices are clear. Review of the MIP images confirms the above findings CTA HEAD FINDINGS Anterior circulation: Both internal carotid arteries are patent through the skull base and siphon regions. There is siphon atherosclerotic calcification with stenosis estimated at 30-50% in the distal siphon region on both sides. The anterior and middle cerebral vessels are patent without large vessel occlusion or correctable proximal stenosis. No aneurysm or vascular malformation. Posterior circulation: Both vertebral arteries are patent through the foramen magnum. The left terminates in PICA. The right vertebral artery supplies the basilar. The basilar artery is a small vessel due to  primary fetal origin of both posterior cerebral arteries. No posterior circulation branch vessel occlusion. 50% stenosis of the left P1 segment. Venous sinuses: Patent and normal. Anatomic variants: None significant. Review of the MIP images confirms the above findings IMPRESSION: No intracranial large vessel occlusion. No flow limiting proximal stenosis. 50%  left PCA P1 stenosis. Primary anterior circulation supply to the posterior cerebral arteries. Atherosclerotic change at both carotid bifurcations, left more than right, but without stenosis by NASCET criteria. Atherosclerotic calcification in the carotid siphon regions with 30-50% stenosis in the distal siphon region on both sides. Electronically Signed   By: Paulina Fusi M.D.   On: 04/06/2022 19:55   CT HEAD WO CONTRAST  Result Date: 04/06/2022 CLINICAL DATA:  Neuro deficit, acute, stroke suspected. EXAM: CT HEAD WITHOUT CONTRAST TECHNIQUE: Contiguous axial images were obtained from the base of the skull through the vertex without intravenous contrast. RADIATION DOSE REDUCTION: This exam was performed according to the departmental dose-optimization program which includes automated exposure control, adjustment of the mA and/or kV according to patient size and/or use of iterative reconstruction technique. COMPARISON:  None Available. FINDINGS: Brain: No focal abnormality seen affecting the brainstem or cerebellum. There is mild ventriculomegaly of the lateral and third ventricles, with a differential diagnosis of central atrophy versus communicating hydrocephalus of a mild degree. Focal 9 mm low density in the corona radiata on the right could represent an acute white matter tract infarction. Cortical and subcortical low-density in the left parietal lobe could represent old or acute infarction. No evidence of hemorrhage, mass effect or extra-axial collection. Vascular: There is atherosclerotic calcification of the major vessels at the base of the brain.  Skull: Negative Sinuses/Orbits: Clear/normal Other: None IMPRESSION: 9 mm low-density in the corona radiata on the right that could be an old or recent infarction. Indistinct low-density in the left parietal cortical and subcortical brain could be old or recent infarction. Prominence of the lateral and third ventricles which could be due to central atrophy or communicating hydrocephalus. Electronically Signed   By: Paulina Fusi M.D.   On: 04/06/2022 17:56   MR BRAIN WO CONTRAST  Result Date: 04/06/2022 CLINICAL DATA:  Right-sided numbness and tingling EXAM: MRI HEAD WITHOUT CONTRAST TECHNIQUE: Multiplanar, multiecho pulse sequences of the brain and surrounding structures were obtained without intravenous contrast. COMPARISON:  None Available. FINDINGS: Brain: Small focus of acute ischemia within the left paramedian pons. No acute or chronic hemorrhage. There is multifocal hyperintense T2-weighted signal within the white matter. Generalized cerebral volume loss. The midline structures are normal. Vascular: Major flow voids are preserved. Skull and upper cervical spine: Normal calvarium and skull base. Visualized upper cervical spine and soft tissues are normal. Sinuses/Orbits:No paranasal sinus fluid levels or advanced mucosal thickening. No mastoid or middle ear effusion. Normal orbits. IMPRESSION: 1. Small focus of acute ischemia within the left paramedian pons. No hemorrhage or mass effect. 2. Findings of chronic small vessel ischemia. Electronically Signed   By: Deatra Robinson M.D.   On: 04/06/2022 22:55   ECHOCARDIOGRAM COMPLETE  Result Date: 04/07/2022    ECHOCARDIOGRAM REPORT   Patient Name:   Ivan Lawson Date of Exam: 04/07/2022 Medical Rec #:  [id number]       Height:       71.0 in Accession #:    [id number]      Weight:       227.0 lb Date of Birth:  [birth year]       BSA:          2.225 m Patient Age:    66 years        BP:           135/83 mmHg Patient Gender: M               HR:  45 bpm.  Exam Location:  Inpatient Procedure: 2D Echo, Cardiac Doppler, Color Doppler and Intracardiac            Opacification Agent Indications:    Stroke  History:        Patient has no prior history of Echocardiogram examinations.                 Abnormal ECG, Stroke, Arrythmias:Bradycardia; Risk                 Factors:Hypertension.  Sonographer:    Sheralyn Boatman RDCS Referring Phys: [id number] Angie Fava  Sonographer Comments: Technically difficult study due to poor echo windows, suboptimal apical window and suboptimal subcostal window. Patient had trouble following directions. Study delayed 20 mins to bathroom patient. IMPRESSIONS  1. Left ventricular ejection fraction, by estimation, is 60 to 65%. The left ventricle has normal function. The left ventricle has no regional wall motion abnormalities. Left ventricular diastolic parameters were normal.  2. Right ventricular systolic function is normal. The right ventricular size is normal. Tricuspid regurgitation signal is inadequate for assessing PA pressure.  3. The mitral valve is grossly normal. Trivial mitral valve regurgitation.  4. The aortic valve is tricuspid. Aortic valve regurgitation is not visualized.  5. The inferior vena cava is normal in size with greater than 50% respiratory variability, suggesting right atrial pressure of 3 mmHg. Comparison(s): No prior Echocardiogram. FINDINGS  Left Ventricle: Left ventricular ejection fraction, by estimation, is 60 to 65%. The left ventricle has normal function. The left ventricle has no regional wall motion abnormalities. Definity contrast agent was given IV to delineate the left ventricular  endocardial borders. The left ventricular internal cavity size was normal in size. There is borderline left ventricular hypertrophy. Left ventricular diastolic parameters were normal. Right Ventricle: The right ventricular size is normal. No increase in right ventricular wall thickness. Right ventricular systolic function is  normal. Tricuspid regurgitation signal is inadequate for assessing PA pressure. Left Atrium: Left atrial size was normal in size. Right Atrium: Right atrial size was normal in size. Pericardium: There is no evidence of pericardial effusion. Mitral Valve: The mitral valve is grossly normal. Trivial mitral valve regurgitation. Tricuspid Valve: The tricuspid valve is grossly normal. Tricuspid valve regurgitation is trivial. Aortic Valve: The aortic valve is tricuspid. Aortic valve regurgitation is not visualized. Pulmonic Valve: The pulmonic valve was grossly normal. Pulmonic valve regurgitation is trivial. Aorta: The aortic root is normal in size and structure. Venous: The inferior vena cava is normal in size with greater than 50% respiratory variability, suggesting right atrial pressure of 3 mmHg. IAS/Shunts: No atrial level shunt detected by color flow Doppler.  LEFT VENTRICLE PLAX 2D LVIDd:         4.70 cm     Diastology LVIDs:         3.00 cm     LV e' medial:    5.55 cm/s LV PW:         1.00 cm     LV E/e' medial:  9.8 LV IVS:        1.00 cm     LV e' lateral:   10.20 cm/s LVOT diam:     2.20 cm     LV E/e' lateral: 5.3 LV SV:         113 LV SV Index:   51 LVOT Area:     3.80 cm  LV Volumes (MOD) LV vol d, MOD A2C: 82.1 ml LV vol d, MOD  A4C: 86.8 ml LV vol s, MOD A2C: 28.2 ml LV vol s, MOD A4C: 36.7 ml LV SV MOD A2C:     53.9 ml LV SV MOD A4C:     86.8 ml LV SV MOD BP:      55.3 ml RIGHT VENTRICLE             IVC RV S prime:     12.90 cm/s  IVC diam: 1.60 cm TAPSE (M-mode): 3.0 cm LEFT ATRIUM             Index        RIGHT ATRIUM           Index LA diam:        4.10 cm 1.84 cm/m   RA Area:     15.40 cm LA Vol (A2C):   43.6 ml 19.59 ml/m  RA Volume:   38.30 ml  17.21 ml/m LA Vol (A4C):   27.9 ml 12.54 ml/m LA Biplane Vol: 35.4 ml 15.91 ml/m  AORTIC VALVE LVOT Vmax:   125.00 cm/s LVOT Vmean:  79.700 cm/s LVOT VTI:    0.296 m  AORTA Ao Root diam: 3.30 cm Ao Asc diam:  3.30 cm MITRAL VALVE MV Area (PHT): 3.48  cm    SHUNTS MV Decel Time: 218 msec    Systemic VTI:  0.30 m MV E velocity: 54.50 cm/s  Systemic Diam: 2.20 cm MV A velocity: 57.20 cm/s MV E/A ratio:  0.95 Nona Dell MD Electronically signed by Nona Dell MD Signature Date/Time: 04/07/2022/10:25:39 AM    Final      Today   Subjective    Ivan Lawson today has no headache,no chest abdominal pain,no new weakness tingling or numbness, feels much better wants to go home today.    Objective   Blood pressure 137/80, pulse (!) 50, temperature 97.9 F (36.6 C), temperature source Oral, resp. rate 17, SpO2 96 %.   Intake/Output Summary (Last 24 hours) at 04/08/2022 0919 Last data filed at 04/08/2022 0600 Gross per 24 hour  Intake 1094.99 ml  Output --  Net 1094.99 ml    Exam  Awake Alert, No new F.N deficits, right arm weakness and minimal right-sided facial droop    [place].AT,PERRAL Supple Neck,   Symmetrical Chest wall movement, Good air movement bilaterally, CTAB RRR,No Gallops,   +ve B.Sounds, Abd Soft, Non tender,  No Cyanosis, Clubbing or edema    Data Review   CBC w Diff:  Lab Results  Component Value Date   WBC 6.5 04/07/2022   HGB 16.3 04/07/2022   HCT 46.1 04/07/2022   PLT 207 04/07/2022   LYMPHOPCT 28 04/07/2022   MONOPCT 7 04/07/2022   EOSPCT 1 04/07/2022   BASOPCT 1 04/07/2022    CMP:  Lab Results  Component Value Date   NA 138 04/08/2022   K 3.8 04/08/2022   CL 104 04/08/2022   CO2 26 04/08/2022   BUN 22 04/08/2022   CREATININE 1.39 (H) 04/08/2022   PROT 6.4 (L) 04/07/2022   ALBUMIN 3.6 04/07/2022   BILITOT 1.9 (H) 04/07/2022   ALKPHOS 45 04/07/2022   AST 26 04/07/2022   ALT 21 04/07/2022   Lab Results  Component Value Date   HGBA1C 6.0 (H) 04/07/2022   Lab Results  Component Value Date   CHOL 153 04/07/2022   HDL 38 (L) 04/07/2022   LDLCALC 101 (H) 04/07/2022   TRIG 72 04/07/2022   CHOLHDL 4.0 04/07/2022     Total Time in  preparing paper work, data evaluation and todays exam -  35 minutes  Susa Raring M.D on 04/08/2022 at 9:19 AM  [facility]

## 2022-04-08 NOTE — Progress Notes (Signed)
Ordered 30 day event monitor for further evaluation of acute CVA at the request of Dr. Thedore Mins. Patient has never been seen in our office so we will also arrange a New Patient visit in about 2 months to review monitor results. ? ?Corrin Parker, PA-C ?04/08/2022 1:24 PM ? ? ?

## 2022-04-08 NOTE — Telephone Encounter (Signed)
Patient notified about cardiac event monitor and new patient appointment with Dr. Buford Dresser.  Letters with details mailed to patient. ?

## 2022-04-12 ENCOUNTER — Inpatient Hospital Stay (HOSPITAL_COMMUNITY): Payer: PPO

## 2022-04-12 ENCOUNTER — Encounter (HOSPITAL_COMMUNITY): Payer: Self-pay | Admitting: Emergency Medicine

## 2022-04-12 ENCOUNTER — Inpatient Hospital Stay (HOSPITAL_COMMUNITY)
Admission: EM | Admit: 2022-04-12 | Discharge: 2022-04-16 | DRG: 065 | Disposition: A | Discharge status: Rehabilitation Facility | Payer: PPO | Attending: Family Medicine | Admitting: Family Medicine

## 2022-04-12 ENCOUNTER — Ambulatory Visit: Payer: PPO | Admitting: Occupational Therapy

## 2022-04-12 ENCOUNTER — Emergency Department (HOSPITAL_COMMUNITY): Payer: PPO

## 2022-04-12 ENCOUNTER — Other Ambulatory Visit: Payer: Self-pay

## 2022-04-12 DIAGNOSIS — Z79890 Hormone replacement therapy (postmenopausal): Secondary | ICD-10-CM | POA: Diagnosis not present

## 2022-04-12 DIAGNOSIS — I6330 Cerebral infarction due to thrombosis of unspecified cerebral artery: Secondary | ICD-10-CM | POA: Diagnosis not present

## 2022-04-12 DIAGNOSIS — R471 Dysarthria and anarthria: Secondary | ICD-10-CM | POA: Diagnosis not present

## 2022-04-12 DIAGNOSIS — E538 Deficiency of other specified B group vitamins: Secondary | ICD-10-CM | POA: Diagnosis not present

## 2022-04-12 DIAGNOSIS — N1831 Chronic kidney disease, stage 3a: Secondary | ICD-10-CM | POA: Diagnosis not present

## 2022-04-12 DIAGNOSIS — I639 Cerebral infarction, unspecified: Secondary | ICD-10-CM | POA: Diagnosis present

## 2022-04-12 DIAGNOSIS — R5381 Other malaise: Secondary | ICD-10-CM | POA: Diagnosis not present

## 2022-04-12 DIAGNOSIS — E890 Postprocedural hypothyroidism: Secondary | ICD-10-CM | POA: Diagnosis present

## 2022-04-12 DIAGNOSIS — E785 Hyperlipidemia, unspecified: Secondary | ICD-10-CM | POA: Diagnosis not present

## 2022-04-12 DIAGNOSIS — I6329 Cerebral infarction due to unspecified occlusion or stenosis of other precerebral arteries: Principal | ICD-10-CM | POA: Diagnosis present

## 2022-04-12 DIAGNOSIS — Y842 Radiological procedure and radiotherapy as the cause of abnormal reaction of the patient, or of later complication, without mention of misadventure at the time of the procedure: Secondary | ICD-10-CM | POA: Diagnosis present

## 2022-04-12 DIAGNOSIS — I129 Hypertensive chronic kidney disease with stage 1 through stage 4 chronic kidney disease, or unspecified chronic kidney disease: Secondary | ICD-10-CM | POA: Diagnosis present

## 2022-04-12 DIAGNOSIS — I6381 Other cerebral infarction due to occlusion or stenosis of small artery: Secondary | ICD-10-CM | POA: Diagnosis not present

## 2022-04-12 DIAGNOSIS — E1122 Type 2 diabetes mellitus with diabetic chronic kidney disease: Secondary | ICD-10-CM | POA: Diagnosis not present

## 2022-04-12 DIAGNOSIS — E0500 Thyrotoxicosis with diffuse goiter without thyrotoxic crisis or storm: Secondary | ICD-10-CM | POA: Diagnosis not present

## 2022-04-12 DIAGNOSIS — I10 Essential (primary) hypertension: Secondary | ICD-10-CM | POA: Diagnosis present

## 2022-04-12 DIAGNOSIS — I651 Occlusion and stenosis of basilar artery: Secondary | ICD-10-CM | POA: Diagnosis not present

## 2022-04-12 DIAGNOSIS — I6622 Occlusion and stenosis of left posterior cerebral artery: Secondary | ICD-10-CM | POA: Diagnosis not present

## 2022-04-12 DIAGNOSIS — R270 Ataxia, unspecified: Secondary | ICD-10-CM | POA: Diagnosis not present

## 2022-04-12 DIAGNOSIS — I69351 Hemiplegia and hemiparesis following cerebral infarction affecting right dominant side: Secondary | ICD-10-CM | POA: Diagnosis not present

## 2022-04-12 DIAGNOSIS — G8191 Hemiplegia, unspecified affecting right dominant side: Secondary | ICD-10-CM | POA: Diagnosis not present

## 2022-04-12 DIAGNOSIS — R29810 Facial weakness: Secondary | ICD-10-CM | POA: Diagnosis not present

## 2022-04-12 DIAGNOSIS — R7303 Prediabetes: Secondary | ICD-10-CM | POA: Diagnosis not present

## 2022-04-12 DIAGNOSIS — R2990 Unspecified symptoms and signs involving the nervous system: Secondary | ICD-10-CM | POA: Diagnosis not present

## 2022-04-12 DIAGNOSIS — I69322 Dysarthria following cerebral infarction: Secondary | ICD-10-CM | POA: Diagnosis not present

## 2022-04-12 DIAGNOSIS — I6523 Occlusion and stenosis of bilateral carotid arteries: Secondary | ICD-10-CM | POA: Diagnosis not present

## 2022-04-12 DIAGNOSIS — Z7902 Long term (current) use of antithrombotics/antiplatelets: Secondary | ICD-10-CM | POA: Diagnosis not present

## 2022-04-12 DIAGNOSIS — R29704 NIHSS score 4: Secondary | ICD-10-CM | POA: Diagnosis not present

## 2022-04-12 DIAGNOSIS — Z79899 Other long term (current) drug therapy: Secondary | ICD-10-CM | POA: Diagnosis not present

## 2022-04-12 DIAGNOSIS — I6322 Cerebral infarction due to unspecified occlusion or stenosis of basilar arteries: Secondary | ICD-10-CM | POA: Diagnosis present

## 2022-04-12 DIAGNOSIS — K5900 Constipation, unspecified: Secondary | ICD-10-CM | POA: Diagnosis not present

## 2022-04-12 DIAGNOSIS — R4781 Slurred speech: Secondary | ICD-10-CM | POA: Diagnosis present

## 2022-04-12 DIAGNOSIS — E039 Hypothyroidism, unspecified: Secondary | ICD-10-CM | POA: Diagnosis not present

## 2022-04-12 LAB — CBC
HCT: 49.5 % (ref 39.0–52.0)
HCT: 46.9 % (ref 39.0–52.0)
Hemoglobin: 17.4 g/dL — ABNORMAL HIGH (ref 13.0–17.0)
Hemoglobin: 15.8 g/dL (ref 13.0–17.0)
MCH: 29.9 pg (ref 26.0–34.0)
MCH: 28.7 pg (ref 26.0–34.0)
MCHC: 35.2 g/dL (ref 30.0–36.0)
MCHC: 33.7 g/dL (ref 30.0–36.0)
MCV: 85.1 fL (ref 80.0–100.0)
MCV: 85.3 fL (ref 80.0–100.0)
Platelets: 239 10*3/uL (ref 150–400)
Platelets: 209 10*3/uL (ref 150–400)
RBC: 5.82 MIL/uL — ABNORMAL HIGH (ref 4.22–5.81)
RBC: 5.5 MIL/uL (ref 4.22–5.81)
RDW: 13.1 % (ref 11.5–15.5)
RDW: 13 % (ref 11.5–15.5)
WBC: 8.2 10*3/uL (ref 4.0–10.5)
WBC: 9.1 10*3/uL (ref 4.0–10.5)
nRBC: 0 % (ref 0.0–0.2)
nRBC: 0 % (ref 0.0–0.2)

## 2022-04-12 LAB — I-STAT CHEM 8, ED
BUN: 21 mg/dL (ref 8–23)
Calcium, Ion: 1.12 mmol/L — ABNORMAL LOW (ref 1.15–1.40)
Chloride: 104 mmol/L (ref 98–111)
Creatinine, Ser: 1.3 mg/dL — ABNORMAL HIGH (ref 0.61–1.24)
Glucose, Bld: 96 mg/dL (ref 70–99)
HCT: 50 % (ref 39.0–52.0)
Hemoglobin: 17 g/dL (ref 13.0–17.0)
Potassium: 3.8 mmol/L (ref 3.5–5.1)
Sodium: 139 mmol/L (ref 135–145)
TCO2: 23 mmol/L (ref 22–32)

## 2022-04-12 LAB — DIFFERENTIAL
Abs Immature Granulocytes: 0.03 10*3/uL (ref 0.00–0.07)
Basophils Absolute: 0 10*3/uL (ref 0.0–0.1)
Basophils Relative: 0 %
Eosinophils Absolute: 0.1 10*3/uL (ref 0.0–0.5)
Eosinophils Relative: 1 %
Immature Granulocytes: 0 %
Lymphocytes Relative: 36 %
Lymphs Abs: 2.9 10*3/uL (ref 0.7–4.0)
Monocytes Absolute: 0.6 10*3/uL (ref 0.1–1.0)
Monocytes Relative: 7 %
Neutro Abs: 4.5 10*3/uL (ref 1.7–7.7)
Neutrophils Relative %: 56 %

## 2022-04-12 LAB — COMPREHENSIVE METABOLIC PANEL
ALT: 22 U/L (ref 0–44)
AST: 25 U/L (ref 15–41)
Albumin: 4.2 g/dL (ref 3.5–5.0)
Alkaline Phosphatase: 58 U/L (ref 38–126)
Anion gap: 9 (ref 5–15)
BUN: 19 mg/dL (ref 8–23)
CO2: 22 mmol/L (ref 22–32)
Calcium: 9.1 mg/dL (ref 8.9–10.3)
Chloride: 105 mmol/L (ref 98–111)
Creatinine, Ser: 1.39 mg/dL — ABNORMAL HIGH (ref 0.61–1.24)
GFR, Estimated: 56 mL/min — ABNORMAL LOW (ref >60)
Glucose, Bld: 102 mg/dL — ABNORMAL HIGH (ref 70–99)
Potassium: 3.7 mmol/L (ref 3.5–5.1)
Sodium: 136 mmol/L (ref 135–145)
Total Bilirubin: 1.7 mg/dL — ABNORMAL HIGH (ref 0.3–1.2)
Total Protein: 7.3 g/dL (ref 6.5–8.1)

## 2022-04-12 LAB — CBG MONITORING, ED: Glucose-Capillary: 104 mg/dL — ABNORMAL HIGH (ref 70–99)

## 2022-04-12 LAB — PROTIME-INR
INR: 1 (ref 0.8–1.2)
Prothrombin Time: 13 seconds (ref 11.4–15.2)

## 2022-04-12 LAB — APTT: aPTT: 27 seconds (ref 24–36)

## 2022-04-12 LAB — CREATININE, SERUM
Creatinine, Ser: 1.27 mg/dL — ABNORMAL HIGH (ref 0.61–1.24)
GFR, Estimated: >60 mL/min (ref >60)

## 2022-04-12 LAB — HIV ANTIBODY (ROUTINE TESTING W REFLEX): HIV Screen 4th Generation wRfx: NONREACTIVE

## 2022-04-12 IMAGING — CT CT HEAD CODE STROKE
4 series · 17 of 47 positions shown, 19 images · non-contrast
Comparison: Six days ago

CLINICAL DATA: Code stroke.  Right-sided deficit



[Series 3: head wo · axial · 0.43mm/px · z∈[-118,+2]mm · 7 of 34 slices shown, 9 images]
[im 5/34  brain]
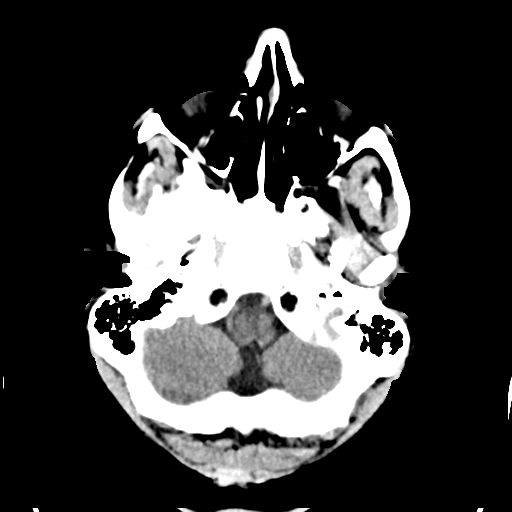
[im 5/34  bone]
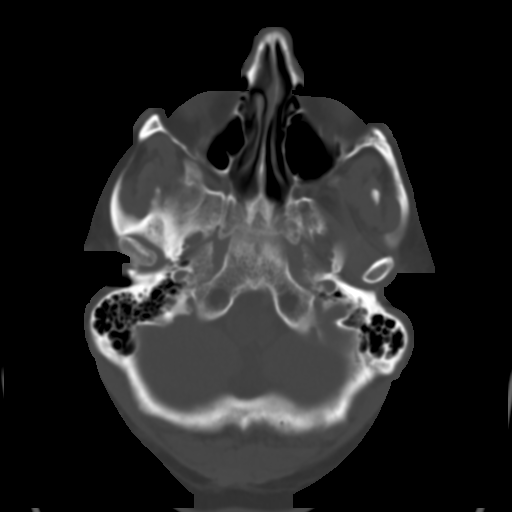
[im 9/34  brain]
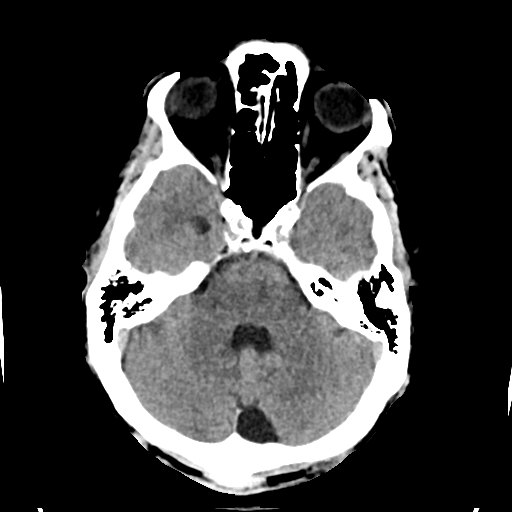
[im 13/34  brain]
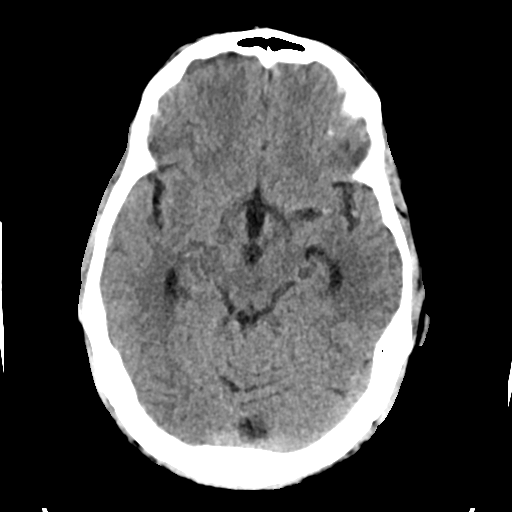
[im 17/34  brain]
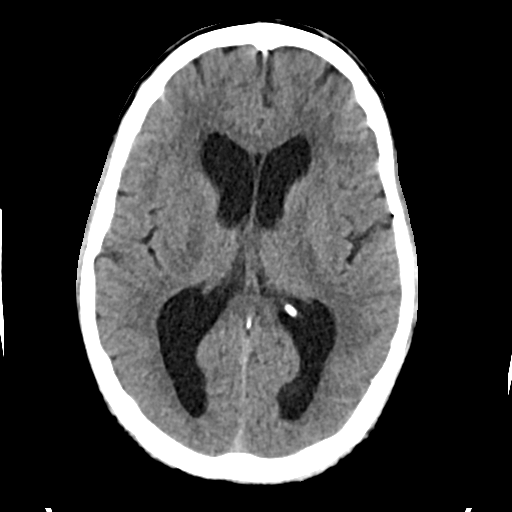
[im 21/34  brain]
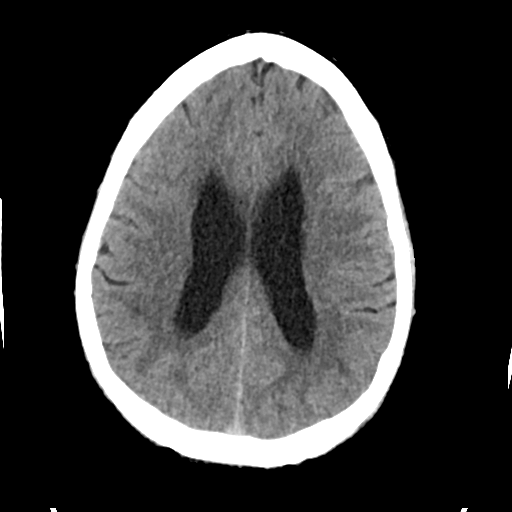
[im 21/34  bone]
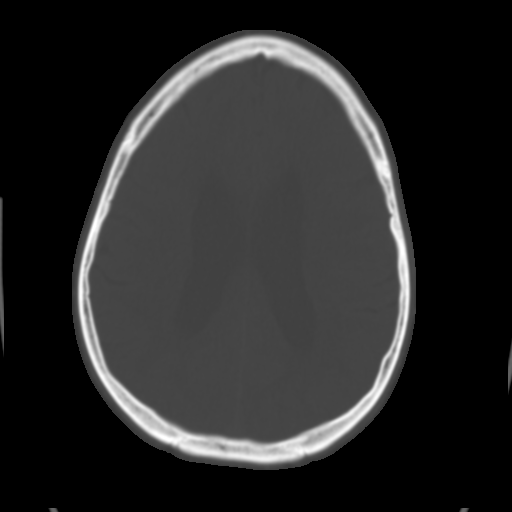
[im 25/34  brain]
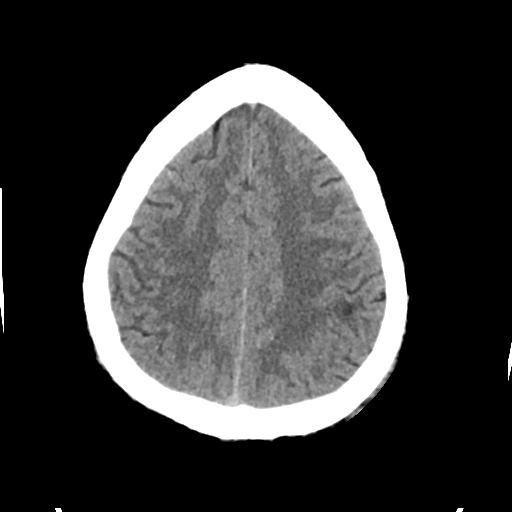
[im 29/34  brain]
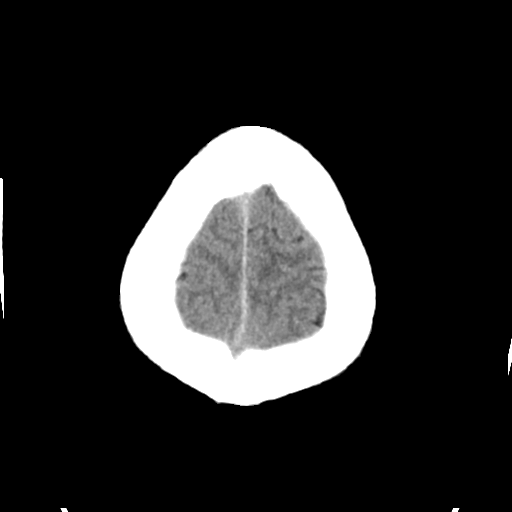

[Series 4: head bone · axial · 0.43mm/px · z∈[-122,-64]mm · 4 of 85 slices shown]
[im 9/85  bone]
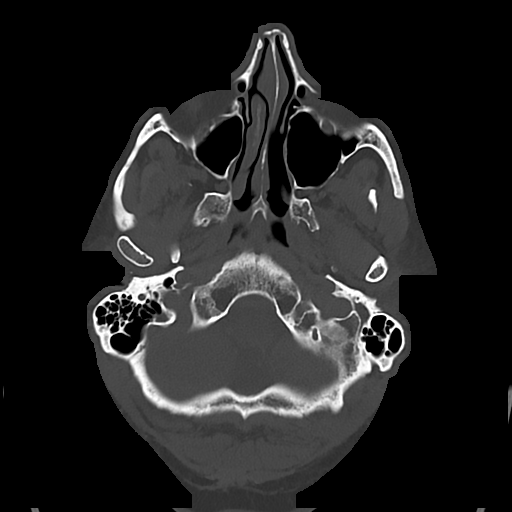
[im 17/85  bone]
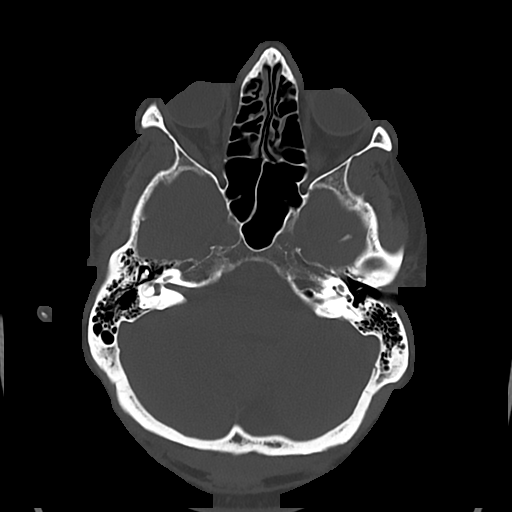
[im 26/85  bone]
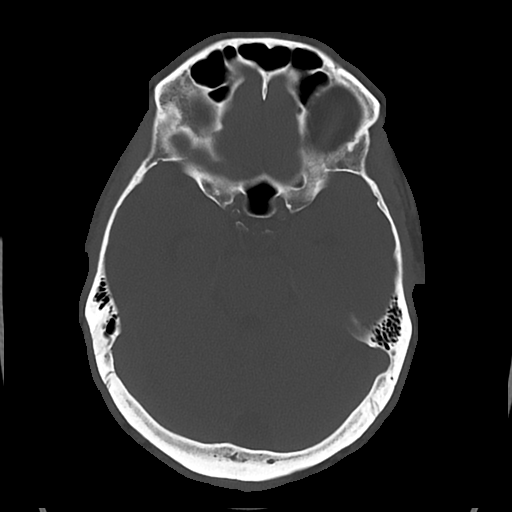
[im 38/85  bone]
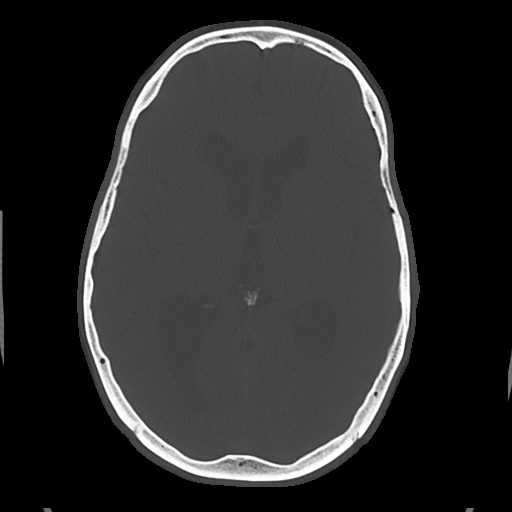

[Series 5: cor soft · coronal · 0.34mm/px · 3 of 73 slices shown]
[im 25/73  brain]
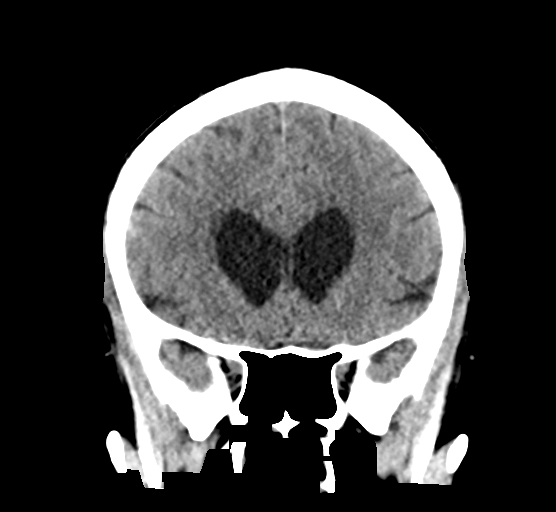
[im 33/73  brain]
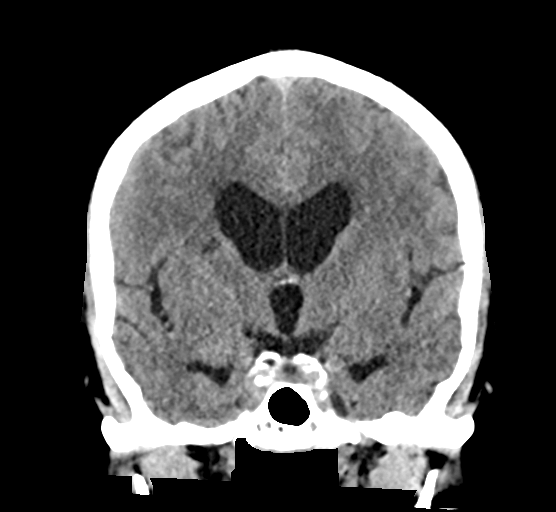
[im 41/73  brain]
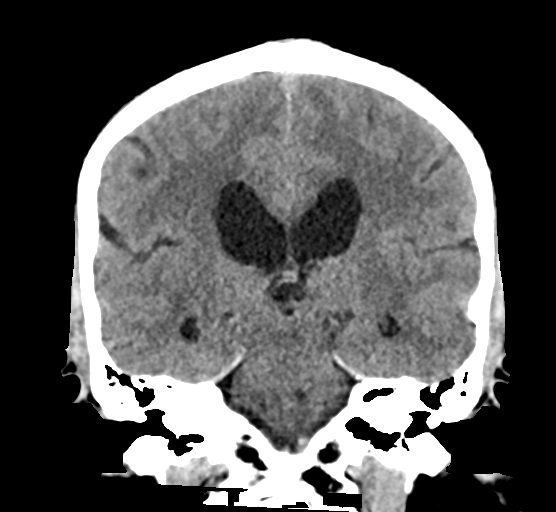

[Series 6: sag soft · sagittal · 0.39mm/px · 3 of 56 slices shown]
[im 19/56  brain]
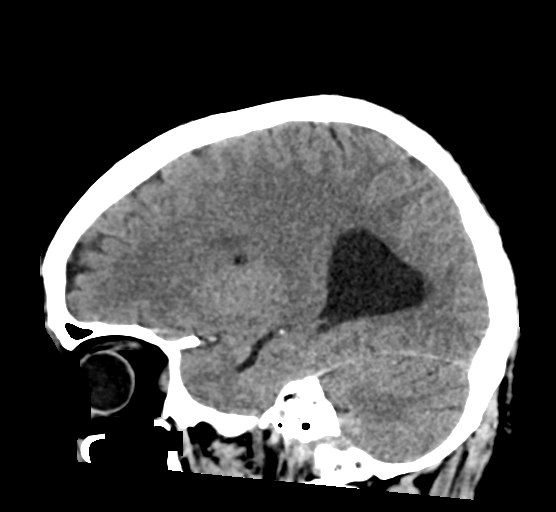
[im 28/56  brain]
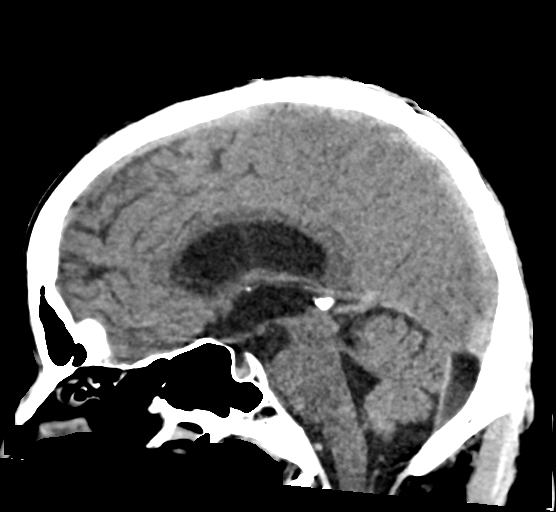
[im 37/56  brain]
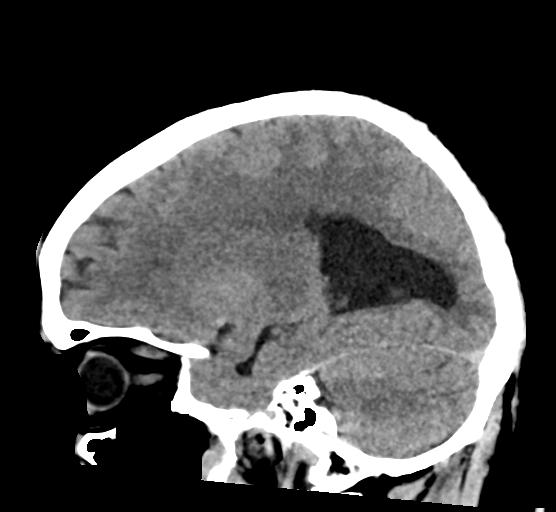

[17 of 47 positions shown; findings below may reference images not displayed]

FINDINGS: Brain: Recent infarct in the left para median pons by prior MRI.
Chronic small vessel ischemia in the hemispheric white matter by
prior MRI, accounting for the patchy left parietal subcortical low
densities which are asymmetric. Is also accounts for a chronic
lacune at the right corona radiata no hemorrhage, obstructive
hydrocephalus, or collection. Central predominant cerebral volume
loss.

Vascular: No asymmetric vessel hyperdensity

Skull: Negative

Sinuses/Orbits: Dysconjugate gaze, nonspecific.

Other: These results were communicated to Dr. RANTONA at [DATE]
on [DATE] by text page via the [employer] messaging system.

ASPECTS (Alberta Stroke Program Early CT Score)

- Ganglionic level infarction (caudate, lentiform nuclei, internal
capsule, insula, M1-M3 cortex): 7

- Supraganglionic infarction (M4-M6 cortex): 3

Total score (0-10 with 10 being normal): 10
IMPRESSION: 1. Known recent left pontine infarct by prior MRI. No detectable
change.
2. Chronic white matter ischemia.

## 2022-04-12 IMAGING — MR MR HEAD W/O CM
12 of 13 series · 44 of 48 positions shown · non-contrast
Comparison: [DATE]

CLINICAL DATA: Stroke, follow up

EXAM:
MRI HEAD WITHOUT CONTRAST
TECHNIQUE: Multiplanar, multiecho pulse sequences of the brain and surrounding
structures were obtained without intravenous contrast.

[Series 5: DWI · axial · 3.0mm · 0.88mm/px · z∈[-117,+39]mm · 8 of 106 slices shown (1 of 4)]
[im 1/106]
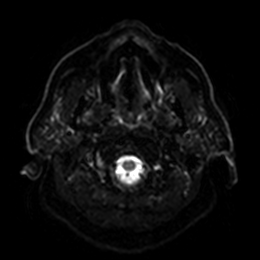
[im 16/106]
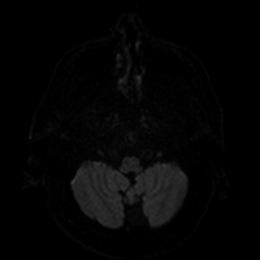
[im 31/106]
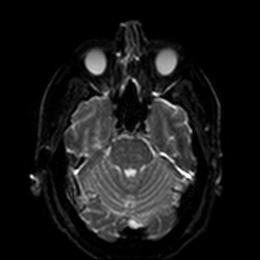
[im 46/106]
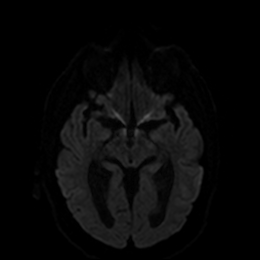
[im 61/106]
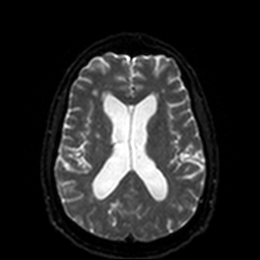
[im 76/106]
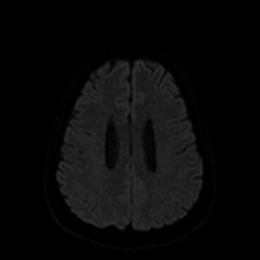
[im 91/106]
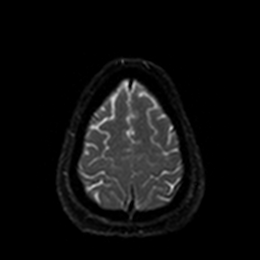
[im 106/106]
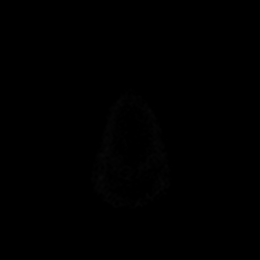

[Series 6: DWI · axial · 3.0mm · 0.88mm/px · z∈[-117,+39]mm · 4 of 53 slices shown (2 of 4)]
[im 1/53]
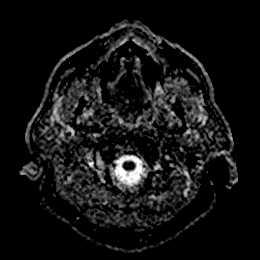
[im 18/53]
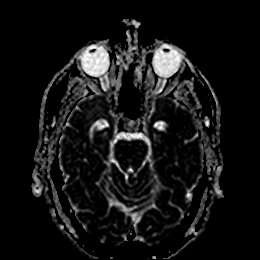
[im 35/53]
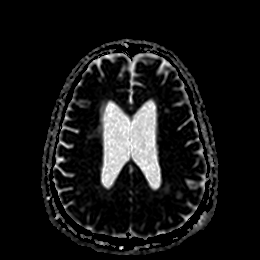
[im 53/53]
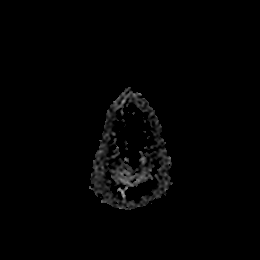

[Series 7: DWI · coronal · 4.0mm · 0.88mm/px · 5 of 72 slices shown (3 of 4)]
[im 1/72]
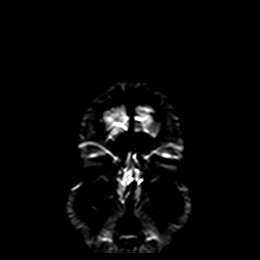
[im 18/72]
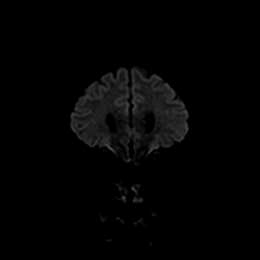
[im 36/72]
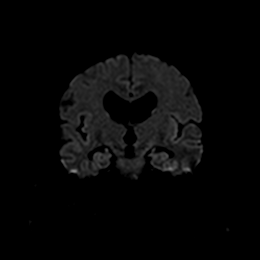
[im 54/72]
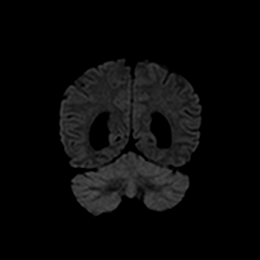
[im 72/72]
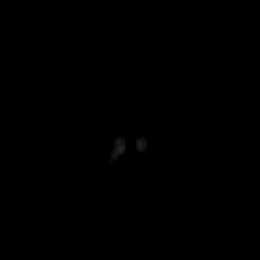

[Series 8: DWI · coronal · 4.0mm · 0.88mm/px · 3 of 36 slices shown (4 of 4)]
[im 1/36]
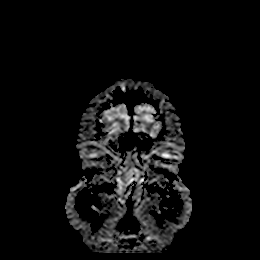
[im 18/36]
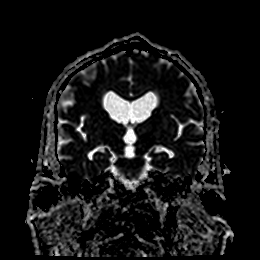
[im 36/36]
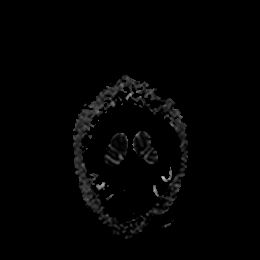

[Series 9: T1 · sagittal · 5.0mm · 0.75mm/px · 2 of 25 slices shown]
[im 1/25]
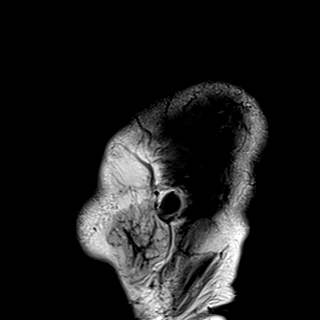
[im 25/25]
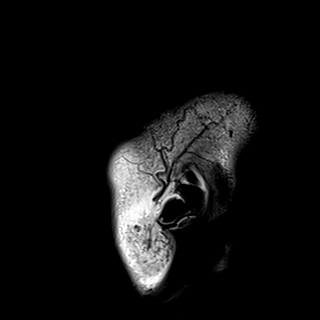

[Series 10: T2 · axial · 5.0mm · 0.72mm/px · z∈[-117,+39]mm · 2 of 27 slices shown (1 of 2)]
[im 1/27]
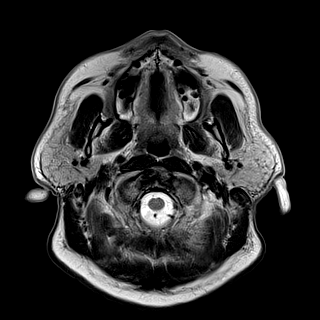
[im 27/27]
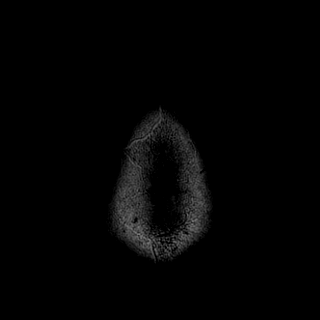

[Series 11: FLAIR · axial · 5.0mm · 0.45mm/px · z∈[-116,+40]mm · 2 of 27 slices shown]
[im 1/27]
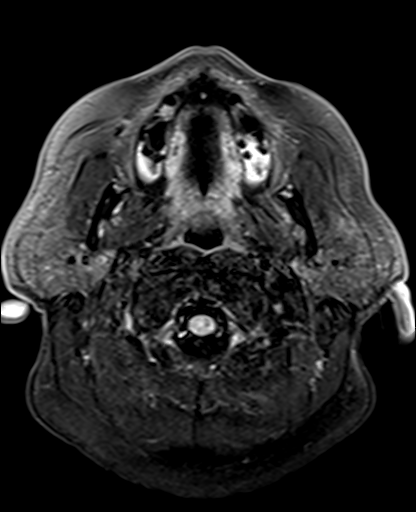
[im 27/27]
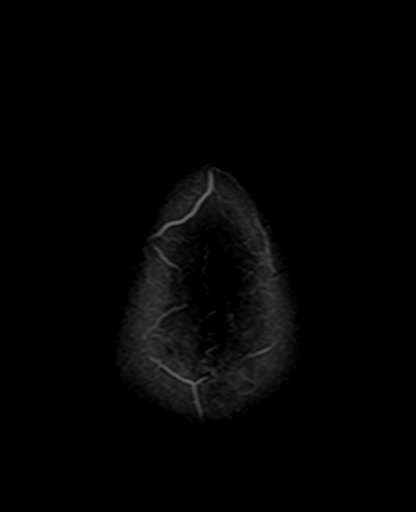

[Series 12: mag_images · axial · 3.0mm · 0.90mm/px · z∈[-121,+44]mm · 4 of 56 slices shown]
[im 1/56]
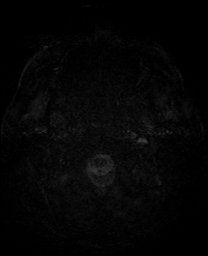
[im 19/56]
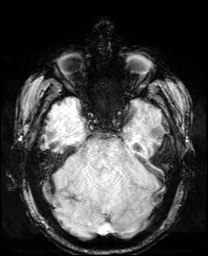
[im 37/56]
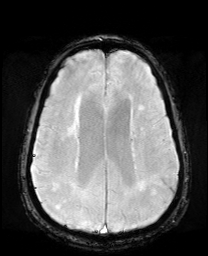
[im 56/56]
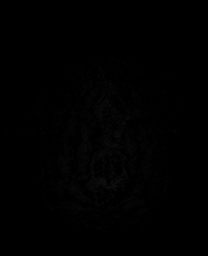

[Series 13: pha_images · axial · 3.0mm · 0.90mm/px · z∈[-121,+44]mm · 4 of 55 slices shown]
[im 1/55]
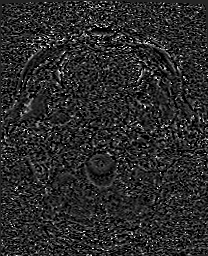
[im 19/55]
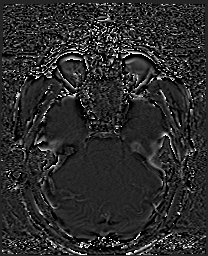
[im 37/55]
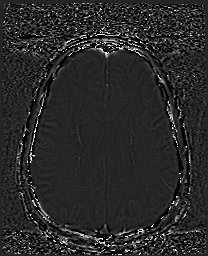
[im 55/55]
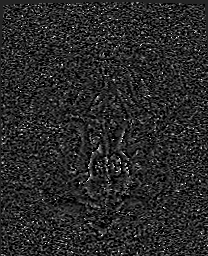

[Series 14: swi_images · axial · 3.0mm · 0.90mm/px · z∈[-121,+44]mm · 4 of 56 slices shown]
[im 1/56]
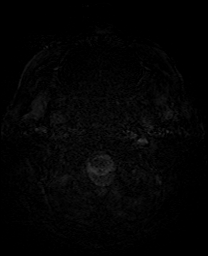
[im 19/56]
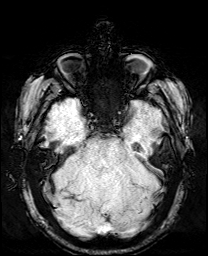
[im 37/56]
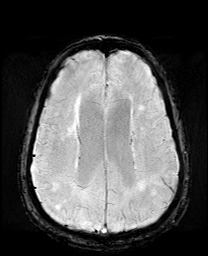
[im 56/56]
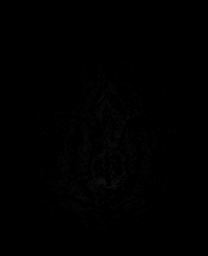

[Series 15: mip_images(sw) · axial · 24.0mm · 0.90mm/px · z∈[-110,+34]mm · 4 of 49 slices shown]
[im 1/49]
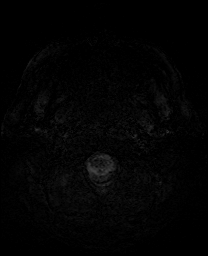
[im 17/49]
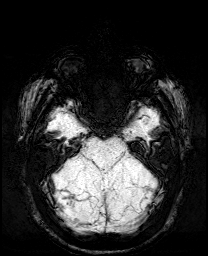
[im 33/49]
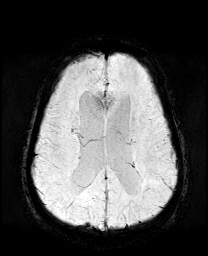
[im 49/49]
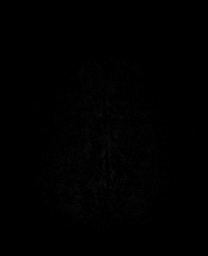

[Series 17: T2 · coronal · 5.0mm · 0.34mm/px · 2 of 31 slices shown (2 of 2)]
[im 1/31]
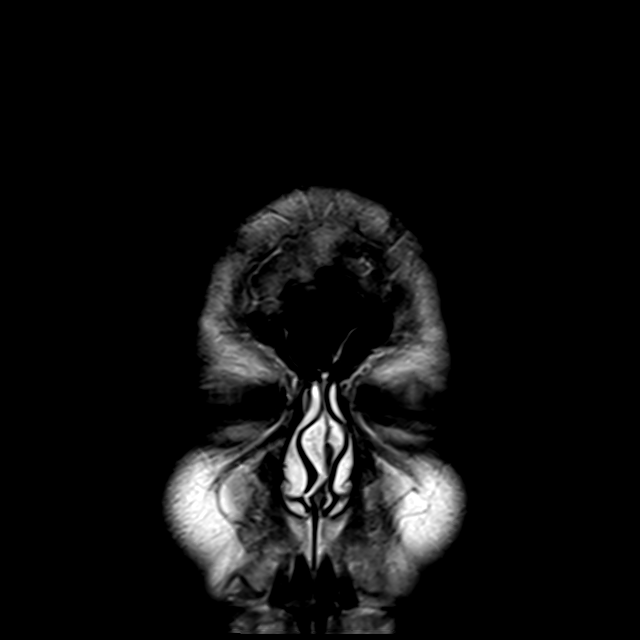
[im 31/31]
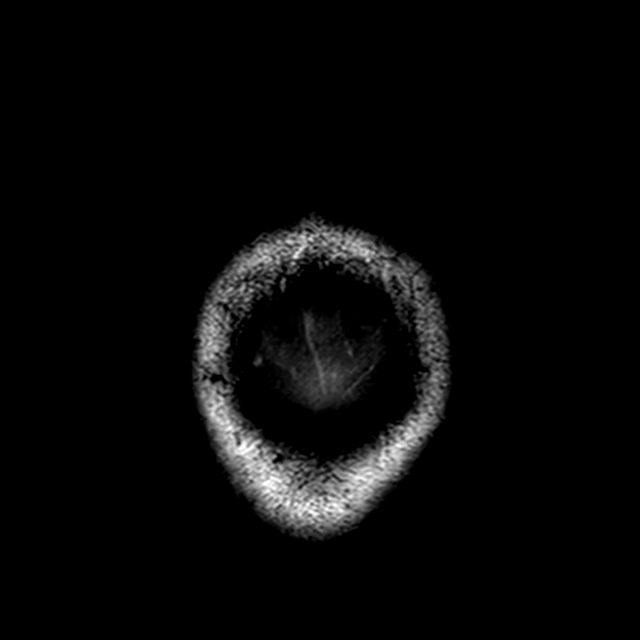

[44 of 48 positions shown; findings below may reference images not displayed]

FINDINGS: Brain: Increase in size of reduced diffusion in the left paramedian
pons. No intracranial hemorrhage. Ventricles and sulci are stable in
size and configuration. Patchy foci of T2 hyperintensity in the
supratentorial white matter are stable and may reflect chronic
microvascular ischemic changes. There is a chronic small vessel
infarct of the right corona radiata. No intracranial mass or mass
effect.

Vascular: Major vessel flow voids at the skull base are preserved.

Skull and upper cervical spine: Normal marrow signal is preserved.

Sinuses/Orbits: Paranasal sinuses are aerated. Orbits are
unremarkable.

Other: Sella is unremarkable.  Mastoid air cells are clear.
IMPRESSION: Acute left paramedian pons infarct with increased extent of
involvement since [DATE]. otherwise stable appearance.

## 2022-04-12 IMAGING — CT CT ANGIO HEAD-NECK (W OR W/O PERF)
2 of 7 series · 8 of 33 positions shown · IV contrast (APPLIED)
Comparison: CTA and brain MRI from 6 days ago.

CLINICAL DATA: Neuro deficit with acute stroke suspected.

EXAM:
CT ANGIOGRAPHY HEAD AND NECK
TECHNIQUE: Multidetector CT imaging of the head and neck was performed using
the standard protocol during bolus administration of intravenous
contrast. Multiplanar CT image reconstructions and MIPs were
obtained to evaluate the vascular anatomy. Carotid stenosis
measurements (when applicable) are obtained utilizing NASCET
criteria, using the distal internal carotid diameter as the
denominator.

[Series 5: cta neck/head · axial · 0.52mm/px · z∈[-230,-100]mm · 2 of 197 slices shown]
[im 66/197  soft-tissue]
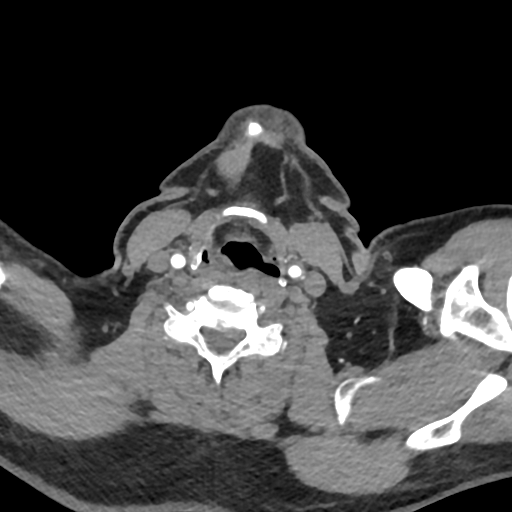
[im 131/197  soft-tissue]
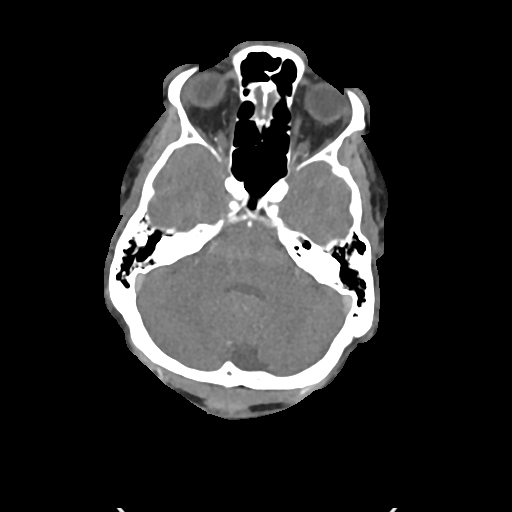

[Series 7: ax thins · axial · 0.39mm/px · z∈[-304,-24]mm · 6 of 394 slices shown]
[im 57/394  soft-tissue]
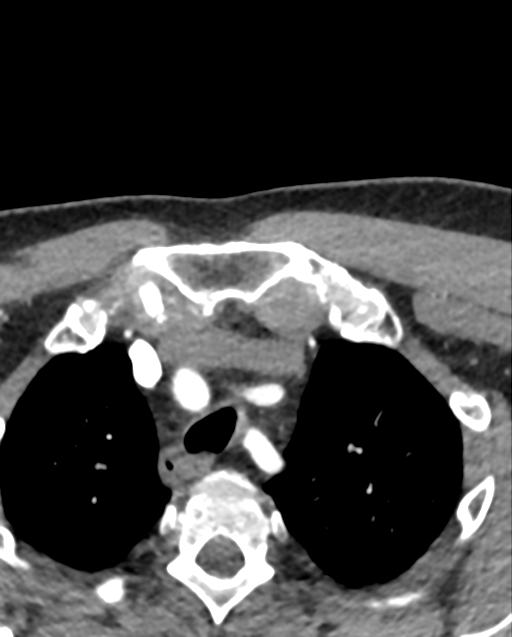
[im 113/394  bone]
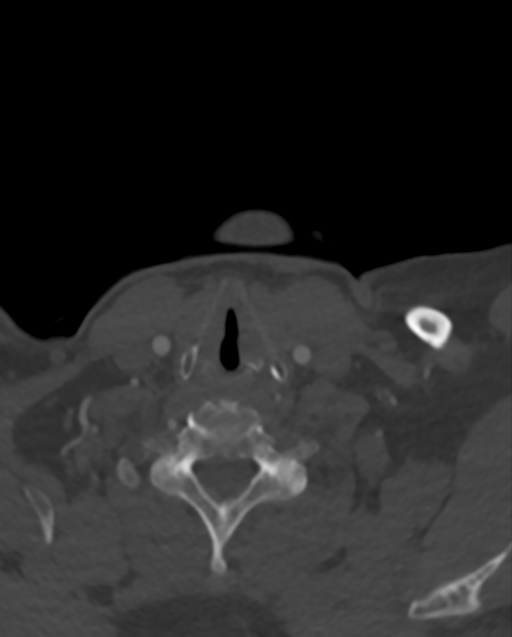
[im 169/394  soft-tissue]
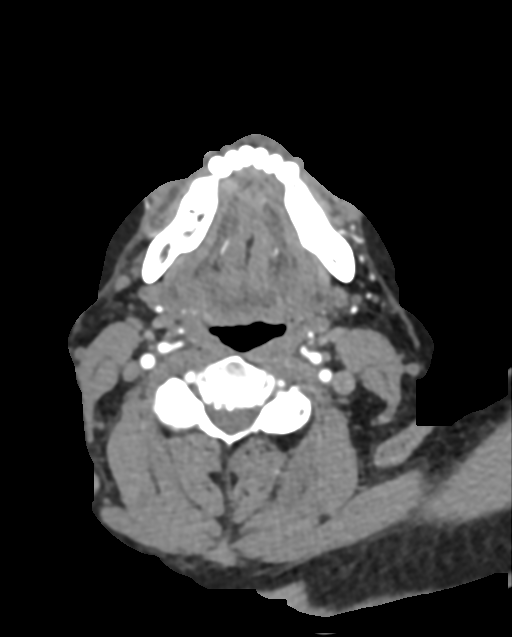
[im 225/394  bone]
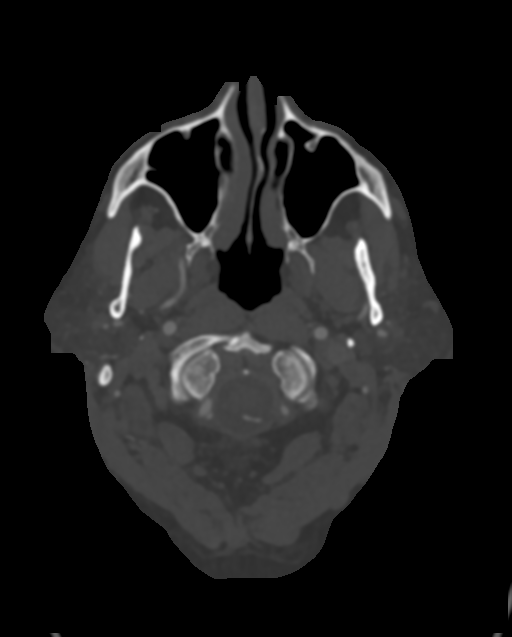
[im 281/394  soft-tissue]
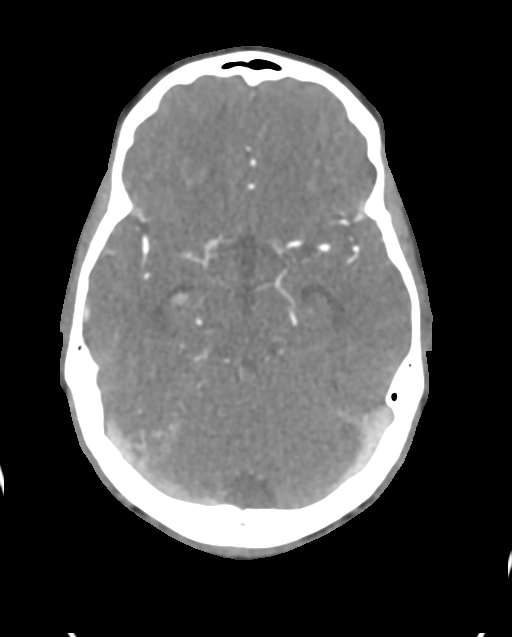
[im 337/394  bone]
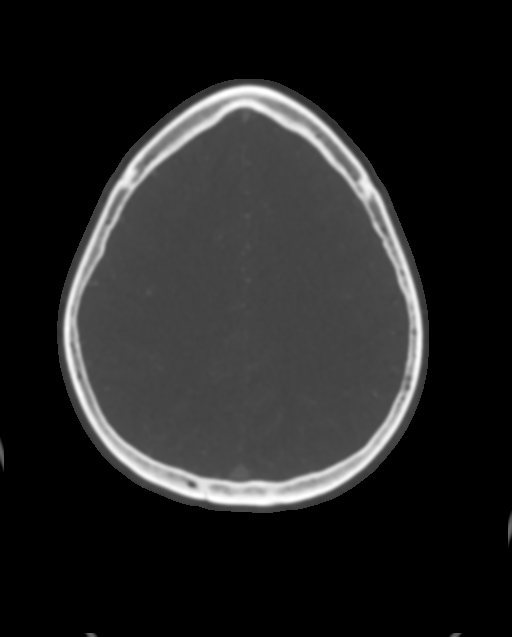

[8 of 33 positions shown; findings below may reference images not displayed]

RADIATION DOSE REDUCTION: This exam was performed according to the
departmental dose-optimization program which includes automated
exposure control, adjustment of the mA and/or kV according to
patient size and/or use of iterative reconstruction technique.

CONTRAST:  50mL OMNIPAQUE IOHEXOL 350 MG/ML SOLN
FINDINGS: CTA NECK FINDINGS

Aortic arch: Unremarkable

Right carotid system: Small mild plaque at the bifurcation without
stenosis or ulceration

Left carotid system: Predominately calcified plaque at the ICA bulb.
No stenosis or ulceration.

Vertebral arteries: No proximal subclavian stenosis. Dominant right
vertebral artery. The vertebral arteries are smoothly contoured and
diffusely patent

Skeleton: None

Other neck: Negative

Upper chest: Negative

Review of the MIP images confirms the above findings

CTA HEAD FINDINGS

Anterior circulation: Atheromatous calcification along the carotid
siphons. No branch occlusion, beading, or aneurysm. High-grade right
A1 origin Stenosis.

Posterior circulation: Dominant right vertebral artery. Fetal type
bilateral PCA. At least moderate narrowing at the left P2 segment.
Moderate mid basilar stenosis. No emergent branch occlusion,
beading, or aneurysm.

Venous sinuses: Patent

Anatomic variants: As above

Review of the MIP images confirms the above findings
IMPRESSION: 1. No emergent finding.
2. Intracranial atherosclerosis with most notable stenoses affecting
the mid basilar, left proximal PCA, and right A1 origin.
3. No flow limiting stenosis or embolic source seen in the neck.

## 2022-04-12 MED ORDER — TICAGRELOR 90 MG PO TABS
90.0000 mg | ORAL_TABLET | Freq: Two times a day (BID) | ORAL | Status: DC
  Administered 2022-04-12 – 2022-04-16 (×9): 90 mg via ORAL
  Filled 2022-04-12 (×9): qty 1

## 2022-04-12 MED ORDER — METHOCARBAMOL 1000 MG/10ML IJ SOLN
500.0000 mg | Freq: Four times a day (QID) | INTRAVENOUS | Status: DC | PRN
  Administered 2022-04-12 – 2022-04-15 (×3): 500 mg via INTRAVENOUS
  Filled 2022-04-12 (×4): qty 5
  Filled 2022-04-12: qty 500

## 2022-04-12 MED ORDER — VITAMIN B-12 1000 MCG PO TABS
1000.0000 ug | ORAL_TABLET | Freq: Every day | ORAL | Status: DC
  Administered 2022-04-12 – 2022-04-16 (×5): 1000 ug via ORAL
  Filled 2022-04-12 (×5): qty 1

## 2022-04-12 MED ORDER — ASPIRIN EC 81 MG PO TBEC
81.0000 mg | DELAYED_RELEASE_TABLET | Freq: Every day | ORAL | Status: DC
  Administered 2022-04-12 – 2022-04-16 (×5): 81 mg via ORAL
  Filled 2022-04-12 (×5): qty 1

## 2022-04-12 MED ORDER — SODIUM CHLORIDE 0.9 % IV BOLUS
2000.0000 mL | Freq: Once | INTRAVENOUS | Status: AC
Start: 2022-04-12 — End: 2022-04-12
  Administered 2022-04-12: 2000 mL via INTRAVENOUS

## 2022-04-12 MED ORDER — SENNOSIDES-DOCUSATE SODIUM 8.6-50 MG PO TABS
1.0000 | ORAL_TABLET | Freq: Every evening | ORAL | Status: DC | PRN
  Administered 2022-04-14 – 2022-04-15 (×2): 1 via ORAL
  Filled 2022-04-12 (×2): qty 1

## 2022-04-12 MED ORDER — SODIUM CHLORIDE 0.9% FLUSH
3.0000 mL | Freq: Two times a day (BID) | INTRAVENOUS | Status: DC
  Administered 2022-04-12 – 2022-04-16 (×9): 3 mL via INTRAVENOUS

## 2022-04-12 MED ORDER — IOHEXOL 350 MG/ML SOLN
100.0000 mL | Freq: Once | INTRAVENOUS | Status: AC | PRN
  Administered 2022-04-12: 50 mL via INTRAVENOUS

## 2022-04-12 MED ORDER — SODIUM CHLORIDE 0.9 % IV SOLN: INTRAVENOUS | Status: DC

## 2022-04-12 MED ORDER — ACETAMINOPHEN 650 MG RE SUPP: 650.0000 mg | Freq: Four times a day (QID) | RECTAL | Status: DC | PRN

## 2022-04-12 MED ORDER — ONDANSETRON HCL 4 MG/2ML IJ SOLN: 4.0000 mg | Freq: Four times a day (QID) | INTRAMUSCULAR | Status: DC | PRN

## 2022-04-12 MED ORDER — CLOPIDOGREL BISULFATE 75 MG PO TABS
75.0000 mg | ORAL_TABLET | Freq: Every day | ORAL | Status: DC
  Filled 2022-04-12: qty 1

## 2022-04-12 MED ORDER — ENOXAPARIN SODIUM 40 MG/0.4ML IJ SOSY
40.0000 mg | PREFILLED_SYRINGE | INTRAMUSCULAR | Status: DC
  Administered 2022-04-12 – 2022-04-15 (×4): 40 mg via SUBCUTANEOUS
  Filled 2022-04-12 (×4): qty 0.4

## 2022-04-12 MED ORDER — ROSUVASTATIN CALCIUM 20 MG PO TABS
20.0000 mg | ORAL_TABLET | Freq: Every day | ORAL | Status: DC
  Administered 2022-04-12 – 2022-04-16 (×5): 20 mg via ORAL
  Filled 2022-04-12 (×5): qty 1

## 2022-04-12 MED ORDER — ONDANSETRON HCL 4 MG PO TABS: 4.0000 mg | ORAL_TABLET | Freq: Four times a day (QID) | ORAL | Status: DC | PRN

## 2022-04-12 MED ORDER — SODIUM CHLORIDE 0.9% FLUSH: 3.0000 mL | Freq: Once | INTRAVENOUS | Status: DC

## 2022-04-12 MED ORDER — LEVOTHYROXINE SODIUM 25 MCG PO TABS
137.0000 ug | ORAL_TABLET | Freq: Every day | ORAL | Status: DC
  Administered 2022-04-13 – 2022-04-16 (×4): 137 ug via ORAL
  Filled 2022-04-12 (×4): qty 1

## 2022-04-12 MED ORDER — ACETAMINOPHEN 325 MG PO TABS: 650.0000 mg | ORAL_TABLET | Freq: Four times a day (QID) | ORAL | Status: DC | PRN

## 2022-04-12 NOTE — ED Notes (Signed)
Neurologist would like pt to lay flat while we are working on getting BP up - Stroke swallow deferred for now  ?

## 2022-04-12 NOTE — H&P (Signed)
?History and Physical  ? ? ?Patient: Ivan Lawson [id number]:[id number] DOB: January 26, 1956 ?DOA: 04/12/2022 ?DOS: the patient was seen and examined on 04/12/2022 ?PCP: Blair Heys, MD  ?Patient coming from: Home ? ?Chief Complaint: No chief complaint on file. ? ?HPI: Ivan Lawson is a 66 y.o. male with medical history significant of hypertension, radioactive iodine for Graves' now with hypothyroidism, CKD 3 with baseline creatinine 1.3 and GFR 56, prediabetes.  Patient was recently discharged from the hospital on/8 after being treated for small acute ischemic stroke in the left paramedian pons with right-sided numbness and paresthesias with mild flaccid dysarthria.  At time of discharge patient's right-sided symptoms had nearly resolved and SLP had evaluated him and also noted that his dysarthria had essentially resolved and he would not need any outpatient SLP nor PT services.  On the morning of presentation patient awakened.  Seem to be fine initially and took his normal blood pressure and other medications.  He subsequent currently redeveloped significant right upper and lower extremity numbness and weakness as well as slurred speech and worsening dysarthria.  Initial CT as well as CTA of the neck without any acute changes including no hemorrhagic changes.  EDP notified neurology who has evaluated the patient.  A stuttering lacunar infarct is suspected.  MRI without contrast recommended.  Patient appeared to be dehydrated so was started on normal saline at 125 cc/h per neurologist recommendation.  Neuro recommended admission for several days for bed rest with near supine positioning and IV fluids.  Not a candidate for tPA due to recent stroke and not candidate for thrombectomy due to no LVO. ? ?Review of Systems:  ?As mentioned in the history of present illness. All other systems reviewed and are negative.  In addition patient states symptoms have worsened when compared to at time of discharge.  He states that the  muscle spasm/somewhat uncontrollable movements of RLE were present at time of discharge but have worsened.  At times feels like cramping. ? ?Past Medical History:  ?Diagnosis Date  ? Hyperthyroidism   ? ?No past surgical history on file. ?Social History:  reports that he has never smoked. He has never used smokeless tobacco. He reports that he does not currently use alcohol. He reports that he does not currently use drugs. ? ?No Known Allergies ? ?Family History  ?Problem Relation Age of Onset  ? Thyroid disease Neg Hx   ? ? ?Prior to Admission medications   ?Medication Sig Start Date End Date Taking? Authorizing Provider  ?amLODipine (NORVASC) 10 MG tablet Take 1 tablet (10 mg total) by mouth daily. 04/09/22  Yes Leroy Sea, MD  ?aspirin 81 MG EC tablet Take 1 tablet (81 mg total) by mouth daily. Swallow whole. 04/08/22  Yes Leroy Sea, MD  ?clopidogrel (PLAVIX) 75 MG tablet Take 1 tablet (75 mg total) by mouth daily. 04/08/22  Yes Leroy Sea, MD  ?hydrALAZINE (APRESOLINE) 50 MG tablet Take 1 tablet (50 mg total) by mouth 3 (three) times daily. 04/09/22 05/09/22 Yes Leroy Sea, MD  ?levothyroxine (SYNTHROID) 137 MCG tablet Take 137 mcg by mouth every morning. 04/01/22  Yes [provider]  ?naproxen sodium (ALEVE) 220 MG tablet Take 440 mg by mouth daily as needed (pain).   Yes [provider]  ?rosuvastatin (CRESTOR) 20 MG tablet Take 1 tablet (20 mg total) by mouth daily. 04/08/22  Yes Leroy Sea, MD  ?vitamin B-12 (CYANOCOBALAMIN) 1000 MCG tablet Take 1,000 mcg by mouth daily.  Yes [provider]  ? ? ?Physical Exam: ?Vitals:  ? 04/12/22 0715 04/12/22 0730 04/12/22 0745 04/12/22 0800  ?BP: 135/67 (!) 142/74 (!) 155/82 (!) 132/108  ?Pulse: 95 97 99 (!) 128  ?Resp: 18 (!) 21 (!) 22 20  ?Temp:      ?TempSrc:      ?SpO2: 100% 99% 99% 98%  ?Weight:      ? ?Constitutional: NAD, calm, comfortable ?Eyes: PERRL, lids and conjunctivae normal ?ENMT: Mucous membranes are  dry. Posterior pharynx clear of any exudate or lesions.Normal dentition.  ?Neck: normal, supple, no masses, no thyromegaly ?Respiratory: clear to auscultation bilaterally, no wheezing, no crackles. Normal respiratory effort. No accessory muscle use.  ?Cardiovascular: Regular rate and rhythm, no murmurs / rubs / gallops. No extremity edema. 2+ pedal pulses. No carotid bruits.  ?Abdomen: no tenderness, no masses palpated. No hepatosplenomegaly. Bowel sounds positive.  ?Musculoskeletal: no clubbing / cyanosis. No joint deformity upper and lower extremities. Good ROM, no contractures. Normal muscle tone.  ?Skin: no rashes, lesions, ulcers. No induration ?Neurologic: CN 2-12 grossly intact except notable for right facial droop and slurring of speech with associated dysarthria.  No difficulty with word finding.  Sensation intact, Strength 5/5 on left side with normal patellar DTR of 1+.  Right upper extremity with very limited gross motor movement of 1/5 with hyperesthesia noted.  Able to lift right leg off of bed and bend knee but has noted spasticity with this attempt.  Able to move right foot very weakly and was reporting dragging of leg when ambulatory.  RLE strength 2/5 with hyperreflexive patellar DTR and also associated hyperesthesia. ?Psychiatric: Normal judgment and insight. Alert and oriented x 3. Normal mood.  ? ?Data Reviewed: ? ?Creatinine 1.27, sodium 139, potassium 3.8, BUN 21, initial hemoglobin 17.4 but follow-up down to 15.8 after IV fluids administered, normal platelets, no hyperglycemia, normal LFTs except for mildly elevated total bilirubin likely related to volume depletion ? ?Assessment and Plan: ? ?Acute CVA symptoms ?Recent ischemic stroke left paramedian pons; neurologist suspect current symptoms likely to stuttering lacunar CVA likely from expansion of the perforator infarct noted earlier during previous admission ?Obtain MRI without contrast brain ?Permissive hypertension during first 24 hours  <220/110-hold preadmission antihypertensives ?Continue low-dose aspirin and Plavix with Plavix being continued for a total of 21 days from first infarct to be followed by aspirin alone ?Continue statin ?Head of bed flat with strict bed rest but after discussing with my attending will allow patient to sit up for stroke swallow evaluation.  If passes swallow evaluation will allow to sit up for meals including at least 15 minutes after eating before returning to flat position ?Recommendation for PT/OT/SLP when no longer on bedrest ?Okay for DVT prophylaxis per neurology ?Continue telemetry ?No need to repeat TTE ?Neurochecks/vital signs per stroke unit protocol ?IV Robaxin available for reported muscle spasms-also switch to p.o. if needed ? ?Carotid artery stenosis ?Secondary prevention as previously documented ?Consider patient follow-up with vascular surgery but will refer to PCP unless after MRI neurologist recommends obtaining during this admission ? ?Acquired hypothyroidism ?Continue Synthroid ? ?Hypertension ?Allow for permissive hypertension as above and hold home medications of Norvasc and hydralazine ? ?CKD 3 ?Baseline creatinine 1.3 with GFR in the 50s, currently at baseline ?HCTZ discontinued previous admission ? ?History of sinus bradycardia ?At time of recent discharge 30-day event monitor plan ?Prolonged discontinued during previous admission as well ?Occurred during previous admission-currently pulse between 88 and 93 ? ? Advance Care Planning:  Code Status: Full Code  ? ?Consults: Neurology ? ?Family Communication: Brother at bedside ? ?DVT prophylaxis: Lovenox ? ?Severity of Illness: ?The appropriate patient status for this patient is INPATIENT. Inpatient status is judged to be reasonable and necessary in order to provide the required intensity of service to ensure the patient's safety. The patient's presenting symptoms, physical exam findings, and initial radiographic and laboratory data in the  context of their chronic comorbidities is felt to place them at high risk for further clinical deterioration. Furthermore, it is not anticipated that the patient will be medically stable for discharge from the

## 2022-04-12 NOTE — ED Notes (Signed)
Pt presents from home for stroke like sx. Recent stroke with hospitalization affecting R side, but had improved before DC home. LWK 1am, noted sx at 5am. ?Denies blood thinners.  ?NIH  for R sided facial droop, R arm drift, R limb ataxia, dysarthria. ?

## 2022-04-12 NOTE — Progress Notes (Addendum)
STROKE TEAM PROGRESS NOTE  ? ?INTERVAL HISTORY ?Patient is seen in his room with no family at the bedside.  He awoke today with right sided weakness, dizziness, diaphoresis and diplopia.  He was seen here for a paramedian pontine stroke on 04/06/22, and his symptoms had improved dramatically until this morning.  His symptoms today are similar to but worse than the stroke symptoms he had last week.  MRI scan of the brain shows increased size of the recent left pontine infarct but otherwise no major changes.  CT angiogram shows mid basilar artery stenosis.  Echocardiogram on 04/07/2022 had shown ejection fraction of 60 to 65%. ? ?Vitals:  ? 04/12/22 0900 04/12/22 0915 04/12/22 0930 04/12/22 0945  ?BP: (!) 147/68 140/70 132/77 122/65  ?Pulse: 98 92 95 90  ?Resp: (!) 22 14 17 13   ?Temp:      ?TempSrc:      ?SpO2: 98% 99% 98% 96%  ?Weight:      ? ?CBC:  ?Recent Labs  ?Lab 04/07/22 ?0900 04/12/22 ?06/12/22 04/12/22 ?06/12/22 04/12/22 ?0820  ?WBC 6.5 8.2  --  9.1  ?NEUTROABS 4.2 4.5  --   --   ?HGB 16.3 17.4* 17.0 15.8  ?HCT 46.1 49.5 50.0 46.9  ?MCV 85.2 85.1  --  85.3  ?PLT 207 239  --  209  ? ?Basic Metabolic Panel:  ?Recent Labs  ?Lab 04/07/22 ?0900 04/08/22 ?0722 04/12/22 ?06/12/22 04/12/22 ?06/12/22 04/12/22 ?0820  ?NA 137 138 136 139  --   ?K 3.9 3.8 3.7 3.8  --   ?CL 104 104 105 104  --   ?CO2 24 26 22   --   --   ?GLUCOSE 101* 101* 102* 96  --   ?BUN 15 22 19 21   --   ?CREATININE 1.35* 1.39* 1.39* 1.30* 1.27*  ?CALCIUM 9.2 8.7* 9.1  --   --   ?MG 1.9 1.9  --   --   --   ? ?Lipid Panel:  ?Recent Labs  ?Lab 04/07/22 ?0900  ?CHOL 153  ?TRIG 72  ?HDL 38*  ?CHOLHDL 4.0  ?VLDL 14  ?LDLCALC 101*  ? ?HgbA1c:  ?Recent Labs  ?Lab 04/07/22 ?0900  ?HGBA1C 6.0*  ? ?Urine Drug Screen:  ?Recent Labs  ?Lab 04/07/22 ?0335  ?LABOPIA NONE DETECTED  ?COCAINSCRNUR NONE DETECTED  ?LABBENZ NONE DETECTED  ?AMPHETMU NONE DETECTED  ?THCU NONE DETECTED  ?LABBARB NONE DETECTED  ?  ?Alcohol Level No results for input(s): ETH in the last 168 hours. ? ?IMAGING past  24 hours ?CT HEAD CODE STROKE WO CONTRAST ? ?Result Date: 04/12/2022 ?CLINICAL DATA:  Code stroke.  Right-sided deficit EXAM: CT HEAD WITHOUT CONTRAST TECHNIQUE: Contiguous axial images were obtained from the base of the skull through the vertex without intravenous contrast. RADIATION DOSE REDUCTION: This exam was performed according to the departmental dose-optimization program which includes automated exposure control, adjustment of the mA and/or kV according to patient size and/or use of iterative reconstruction technique. COMPARISON:  Six days ago FINDINGS: Brain: Recent infarct in the left para median pons by prior MRI. Chronic small vessel ischemia in the hemispheric white matter by prior MRI, accounting for the patchy left parietal subcortical low densities which are asymmetric. Is also accounts for a chronic lacune at the right corona radiata no hemorrhage, obstructive hydrocephalus, or collection. Central predominant cerebral volume loss. Vascular: No asymmetric vessel hyperdensity Skull: Negative Sinuses/Orbits: Dysconjugate gaze, nonspecific. Other: These results were communicated to Dr. 06/07/22 at 5:57 am on 04/12/2022 by text  page via the [facility] messaging system. ASPECTS [facility]([place] Stroke Program Early CT Score) - Ganglionic level infarction (caudate, lentiform nuclei, internal capsule, insula, M1-M3 cortex): 7 - Supraganglionic infarction (M4-M6 cortex): 3 Total score (0-10 with 10 being normal): 10 IMPRESSION: 1. Known recent left pontine infarct by prior MRI. No detectable change. 2. Chronic white matter ischemia. Electronically Signed   By: Tiburcio PeaJonathan  Watts M.D.   On: 04/12/2022 05:59  ? ?CT ANGIO HEAD NECK W WO CM (CODE STROKE) ? ?Result Date: 04/12/2022 ?CLINICAL DATA:  Neuro deficit with acute stroke suspected. EXAM: CT ANGIOGRAPHY HEAD AND NECK TECHNIQUE: Multidetector CT imaging of the head and neck was performed using the standard protocol during bolus administration of intravenous contrast.  Multiplanar CT image reconstructions and MIPs were obtained to evaluate the vascular anatomy. Carotid stenosis measurements (when applicable) are obtained utilizing NASCET criteria, using the distal internal carotid diameter as the denominator. RADIATION DOSE REDUCTION: This exam was performed according to the departmental dose-optimization program which includes automated exposure control, adjustment of the mA and/or kV according to patient size and/or use of iterative reconstruction technique. CONTRAST:  50mL OMNIPAQUE IOHEXOL 350 MG/ML SOLN COMPARISON:  CTA and brain MRI from 6 days ago. FINDINGS: CTA NECK FINDINGS Aortic arch: Unremarkable Right carotid system: Small mild plaque at the bifurcation without stenosis or ulceration Left carotid system: Predominately calcified plaque at the ICA bulb. No stenosis or ulceration. Vertebral arteries: No proximal subclavian stenosis. Dominant right vertebral artery. The vertebral arteries are smoothly contoured and diffusely patent Skeleton: None Other neck: Negative Upper chest: Negative Review of the MIP images confirms the above findings CTA HEAD FINDINGS Anterior circulation: Atheromatous calcification along the carotid siphons. No branch occlusion, beading, or aneurysm. High-grade right A1 origin Stenosis. Posterior circulation: Dominant right vertebral artery. Fetal type bilateral PCA. At least moderate narrowing at the left P2 segment. Moderate mid basilar stenosis. No emergent branch occlusion, beading, or aneurysm. Venous sinuses: Patent Anatomic variants: As above Review of the MIP images confirms the above findings IMPRESSION: 1. No emergent finding. 2. Intracranial atherosclerosis with most notable stenoses affecting the mid basilar, left proximal PCA, and right A1 origin. 3. No flow limiting stenosis or embolic source seen in the neck. Electronically Signed   By: Tiburcio PeaJonathan  Watts M.D.   On: 04/12/2022 06:05   ? ?PHYSICAL EXAM ?General:  Alert, well-developed,  well-nourished middle-age Caucasian male patient in no acute distress. ?Respiratory:  Regular, unlabored respirations on room air ? ?NEURO:  ?Mental Status: AA&Ox3  ?Speech/Language: speech is with mil dysarthria and hypophonia repetition and fluency are impaired. ? ?Cranial Nerves:  ?II: PERRL. Visual fields full.  ?III, IV, VI: EOMI. Eyelids elevate symmetrically.  ?V: Sensation is intact to light touch and slightly diminished on the right ?VII: Right mild lower facial droop present ?VIII: hearing intact to voice. ?XII: tongue is midline without fasciculations. ?Motor: 5/5 strength to LUE and LLE, 3/5 proximal and 0/5 distal to RUE, 4/5 to RLE ?Tone: is normal and bulk is normal ?Sensation- Intact to light touch bilaterally.  ?Coordination: No drift on left ?Gait- deferred ? ?NIH 4   Premorbid MRS 1 ? ?ASSESSMENT/PLAN ?Mr. Ivan Lawson is a 66 y.o. male with history of left paramedian pontine stroke, DM2 and hyperthyroidism presenting with right sided weakness, dizziness, diaphoresis and diplopia.  He was seen here for a paramedian pontine stroke on 5/6, and his symptoms had improved dramatically until this morning.  His symptoms today are similar to but worse than the stroke  symptoms he had last week.   ? ?Stroke: worsening of left paramedian pontine infarct likely secondary due to basilar artery stenosis source ?Code Stroke CT head No acute abnormality. Small vessel disease. Known left pontine infarct. ASPECTS 10.    ?CTA head & neck Stenoses in mid basilar, left proximal PCA and right A1 origin ?MRI  worsening of left pontine infarct ?2D Echo EF 60-65%, no atrial level shunt ?LDL 101 ?HgbA1c 6.0 ?VTE prophylaxis - lovenox ?   ?Diet  ? Diet NPO time specified Except for: Sips with Meds  ? ?aspirin 81 mg daily and clopidogrel 75 mg daily prior to admission, now on aspirin 81 mg daily and Brilinta (ticagrelor) 90 mg bid.  ?Therapy recommendations:  pending ?Disposition:  pending ? ?Hypertension ?Home meds:   hydralazine 50 mg TID, amlodipine 10 mg daily ?Stable ?Permissive hypertension (OK if < 220/120) but gradually normalize in 5-7 days ?Long-term BP goal normotensive ? ?Hyperlipidemia ?Home meds:  rosuvastati

## 2022-04-12 NOTE — ED Notes (Signed)
Transported to MRI

## 2022-04-12 NOTE — ED Notes (Signed)
Arrival back to room pt feels that symptoms are improving  ?

## 2022-04-12 NOTE — ED Notes (Signed)
Family and neurologist at bedside  ?

## 2022-04-12 NOTE — ED Triage Notes (Signed)
EDP at bedside  

## 2022-04-12 NOTE — ED Provider Notes (Signed)
?[facility] ?Provider Note ? ? ?CSN: [id number] ?Arrival date & time: 04/12/22  0516 ? ?  ? ?History ? ?No chief complaint on file. ? ? ?Ivan Lawson is a 66 y.o. male. ? ?66 year old male recently discharged here about 4 days ago secondary to pontine stroke.  Patient states that he went to bed around 1:00 last night woke up this morning and had dysarthria, right facial droop and right arm weakness that they are worse than they were when he presented for stroke last time.  Patient states last known normal was probably around 1:00.  Patient has no other recent injuries or illnesses. ? ? ? ?  ? ?Home Medications ?Prior to Admission medications   ?Medication Sig Start Date End Date Taking? Authorizing Provider  ?amLODipine (NORVASC) 10 MG tablet Take 1 tablet (10 mg total) by mouth daily. 04/09/22  Yes Thurnell Lose, MD  ?aspirin 81 MG EC tablet Take 1 tablet (81 mg total) by mouth daily. Swallow whole. 04/08/22  Yes Thurnell Lose, MD  ?clopidogrel (PLAVIX) 75 MG tablet Take 1 tablet (75 mg total) by mouth daily. 04/08/22  Yes Thurnell Lose, MD  ?hydrALAZINE (APRESOLINE) 50 MG tablet Take 1 tablet (50 mg total) by mouth 3 (three) times daily. 04/09/22 05/09/22 Yes Thurnell Lose, MD  ?levothyroxine (SYNTHROID) 137 MCG tablet Take 137 mcg by mouth every morning. 04/01/22  Yes [provider]  ?naproxen sodium (ALEVE) 220 MG tablet Take 440 mg by mouth daily as needed (pain).   Yes [provider]  ?rosuvastatin (CRESTOR) 20 MG tablet Take 1 tablet (20 mg total) by mouth daily. 04/08/22  Yes Thurnell Lose, MD  ?vitamin B-12 (CYANOCOBALAMIN) 1000 MCG tablet Take 1,000 mcg by mouth daily.   Yes [provider]  ?   ? ?Allergies    ?Patient has no known allergies.   ? ?Review of Systems   ?Review of Systems ? ?Physical Exam ?Updated Vital Signs ?BP (!) 153/74   Pulse 89   Temp 98.5 ?F (36.9 ?C) (Oral)   Resp (!) 21   Wt 95.3 kg   SpO2 95%   BMI  29.29 kg/m?  ?Physical Exam ?Vitals and nursing note reviewed.  ?HENT:  ?   Nose: Nose normal. No congestion or rhinorrhea.  ?   Mouth/Throat:  ?   Mouth: Mucous membranes are moist.  ?   Pharynx: Oropharynx is clear.  ?Eyes:  ?   Pupils: Pupils are equal, round, and reactive to light.  ?Pulmonary:  ?   Effort: Pulmonary effort is normal.  ?Abdominal:  ?   General: Abdomen is flat.  ?Musculoskeletal:     ?   General: No swelling or tenderness. Normal range of motion.  ?Skin: ?   General: Skin is warm and dry.  ?Neurological:  ?   Comments: Unable to lift the right arm at against gravity.  Significant dysarthria.  ? ? ?ED Results / Procedures / Treatments   ?Labs ?(all labs ordered are listed, but only abnormal results are displayed) ?Labs Reviewed  ?CBC - Abnormal; Notable for the following components:  ?    Result Value  ? RBC 5.82 (*)   ? Hemoglobin 17.4 (*)   ? All other components within normal limits  ?COMPREHENSIVE METABOLIC PANEL - Abnormal; Notable for the following components:  ? Glucose, Bld 102 (*)   ? Creatinine, Ser 1.39 (*)   ? Total Bilirubin 1.7 (*)   ? GFR,  Estimated 56 (*)   ? All other components within normal limits  ?I-STAT CHEM 8, ED - Abnormal; Notable for the following components:  ? Creatinine, Ser 1.30 (*)   ? Calcium, Ion 1.12 (*)   ? All other components within normal limits  ?CBG MONITORING, ED - Abnormal; Notable for the following components:  ? Glucose-Capillary 104 (*)   ? All other components within normal limits  ?PROTIME-INR  ?APTT  ?DIFFERENTIAL  ? ? ?EKG ?None ? ?Radiology ?CT HEAD CODE STROKE WO CONTRAST ? ?Result Date: 04/12/2022 ?CLINICAL DATA:  Code stroke.  Right-sided deficit EXAM: CT HEAD WITHOUT CONTRAST TECHNIQUE: Contiguous axial images were obtained from the base of the skull through the vertex without intravenous contrast. RADIATION DOSE REDUCTION: This exam was performed according to the departmental dose-optimization program which includes automated exposure control,  adjustment of the mA and/or kV according to patient size and/or use of iterative reconstruction technique. COMPARISON:  Six days ago FINDINGS: Brain: Recent infarct in the left para median pons by prior MRI. Chronic small vessel ischemia in the hemispheric white matter by prior MRI, accounting for the patchy left parietal subcortical low densities which are asymmetric. Is also accounts for a chronic lacune at the right corona radiata no hemorrhage, obstructive hydrocephalus, or collection. Central predominant cerebral volume loss. Vascular: No asymmetric vessel hyperdensity Skull: Negative Sinuses/Orbits: Dysconjugate gaze, nonspecific. Other: These results were communicated to Dr. Lorrin Goodell at 5:57 am on 04/12/2022 by text page via the [facility] messaging system. ASPECTS [facility] Stroke Program Early CT Score) - Ganglionic level infarction (caudate, lentiform nuclei, internal capsule, insula, M1-M3 cortex): 7 - Supraganglionic infarction (M4-M6 cortex): 3 Total score (0-10 with 10 being normal): 10 IMPRESSION: 1. Known recent left pontine infarct by prior MRI. No detectable change. 2. Chronic white matter ischemia. Electronically Signed   By: Jorje Guild M.D.   On: 04/12/2022 05:59  ? ?CT ANGIO HEAD NECK W WO CM (CODE STROKE) ? ?Result Date: 04/12/2022 ?CLINICAL DATA:  Neuro deficit with acute stroke suspected. EXAM: CT ANGIOGRAPHY HEAD AND NECK TECHNIQUE: Multidetector CT imaging of the head and neck was performed using the standard protocol during bolus administration of intravenous contrast. Multiplanar CT image reconstructions and MIPs were obtained to evaluate the vascular anatomy. Carotid stenosis measurements (when applicable) are obtained utilizing NASCET criteria, using the distal internal carotid diameter as the denominator. RADIATION DOSE REDUCTION: This exam was performed according to the departmental dose-optimization program which includes automated exposure control, adjustment of the mA and/or kV  according to patient size and/or use of iterative reconstruction technique. CONTRAST:  51mL OMNIPAQUE IOHEXOL 350 MG/ML SOLN COMPARISON:  CTA and brain MRI from 6 days ago. FINDINGS: CTA NECK FINDINGS Aortic arch: Unremarkable Right carotid system: Small mild plaque at the bifurcation without stenosis or ulceration Left carotid system: Predominately calcified plaque at the ICA bulb. No stenosis or ulceration. Vertebral arteries: No proximal subclavian stenosis. Dominant right vertebral artery. The vertebral arteries are smoothly contoured and diffusely patent Skeleton: None Other neck: Negative Upper chest: Negative Review of the MIP images confirms the above findings CTA HEAD FINDINGS Anterior circulation: Atheromatous calcification along the carotid siphons. No branch occlusion, beading, or aneurysm. High-grade right A1 origin Stenosis. Posterior circulation: Dominant right vertebral artery. Fetal type bilateral PCA. At least moderate narrowing at the left P2 segment. Moderate mid basilar stenosis. No emergent branch occlusion, beading, or aneurysm. Venous sinuses: Patent Anatomic variants: As above Review of the MIP images confirms the above findings IMPRESSION: 1. No  emergent finding. 2. Intracranial atherosclerosis with most notable stenoses affecting the mid basilar, left proximal PCA, and right A1 origin. 3. No flow limiting stenosis or embolic source seen in the neck. Electronically Signed   By: Jorje Guild M.D.   On: 04/12/2022 06:05   ? ?Procedures ?Procedures  ? ? ?Medications Ordered in ED ?Medications  ?sodium chloride flush (NS) 0.9 % injection 3 mL (3 mLs Intravenous Not Given 04/12/22 0611)  ?0.9 %  sodium chloride infusion ( Intravenous New Bag/Given 04/12/22 0649)  ?iohexol (OMNIPAQUE) 350 MG/ML injection 100 mL (50 mLs Intravenous Contrast Given 04/12/22 0556)  ?sodium chloride 0.9 % bolus 2,000 mL (2,000 mLs Intravenous New Bag/Given 04/12/22 [id number])  ? ? ?ED Course/ Medical Decision Making/ A&P ?   ?                        ?Medical Decision Making ?Amount and/or Complexity of Data Reviewed ?Labs: ordered. ?Radiology: ordered. ? ?Risk ?Prescription drug management. ?Decision regarding hospitalization. ? ?

## 2022-04-12 NOTE — Consult Note (Signed)
NEUROLOGY CONSULTATION NOTE  ? ?Date of service: Apr 12, 2022 ?Patient Name: Ivan Lawson ?MRN:  [id number] ?DOB:  Jul 20, 1956 ?Reason for consult: "Stroke code for sudden onset R sided weakness, dysconjugate gaze, R sided numbness and diaphoretic" ?Requesting Provider: Merrily Pew, MD ?_ _ _   _ __   _ __ _ _  __ __   _ __   __ _ ? ?History of Present Illness  ?Ivan Lawson is a 66 y.o. male with PMH significant for DM2, hyperthyroidism, recent left paramedian stroke on 04/06/22 with significant improvement in his symptoms. He went to bed at 2300 on 04/11/22 but reports that he was up in the bed until 0200 on 04/12/22. He woke up at 0500 and was unable to move his RUE and RLE with dizziness, double vision, diaphoertic. Felt symptoms were similar to his stroke symptoms last week but much worse. In the triage, he worsened and reported numbness in the RUE and RLE. ? ? ?LKW: 0200 on 04/12/22 ?mRS: 0 ?tNKASE: not offered, recent stroke. ?Thrombectomy: not offered, no LVO ?NIHSS components Score: Comment  ?1a Level of Conscious 0[x]  1[]  2[]  3[]      ?1b LOC Questions 0[x]  1[]  2[]       ?1c LOC Commands 0[x]  1[]  2[]       ?2 Best Gaze 0[x]  1[]  2[]       ?3 Visual 0[x]  1[]  2[]  3[]      ?4 Facial Palsy 0[x]  1[]  2[]  3[]      ?5a Motor Arm - left 0[x]  1[]  2[]  3[]  4[]  UN[]    ?5b Motor Arm - Right 0[]  1[]  2[]  3[]  4[x]  UN[]    ?6a Motor Leg - Left 0[x]  1[]  2[]  3[]  4[]  UN[]    ?6b Motor Leg - Right 0[]  1[]  2[]  3[x]  4[]  UN[]    ?7 Limb Ataxia 0[]  1[x]  2[]  3[]  UN[]     ?8 Sensory 0[]  1[x]  2[]  UN[]      ?9 Best Language 0[x]  1[]  2[]  3[]      ?10 Dysarthria 0[x]  1[]  2[]  UN[]      ?11 Extinct. and Inattention 0[x]  1[]  2[]       ?TOTAL: 9   ?  ?ROS  ? ?Constitutional Denies weight loss, fever and chills.   ?HEENT Denies changes in vision and hearing.   ?Respiratory Denies SOB and cough.   ?CV Denies palpitations and CP   ?GI Denies abdominal pain, nausea, vomiting and diarrhea.   ?GU Denies dysuria and urinary frequency.   ?MSK Denies myalgia and  joint pain.   ?Skin Denies rash and pruritus.   ?Neurological Denies headache and syncope.   ?Psychiatric Denies recent changes in mood. Denies anxiety and depression.   ? ?Past History  ? ?Past Medical History:  ?Diagnosis Date  ? Hyperthyroidism   ? ?No past surgical history on file. ?Family History  ?Problem Relation Age of Onset  ? Thyroid disease Neg Hx   ? ?Social History  ? ?Socioeconomic History  ? Marital status: Married  ?  Spouse name: Not on file  ? Number of children: Not on file  ? Years of education: Not on file  ? Highest education level: Not on file  ?Occupational History  ? Not on file  ?Tobacco Use  ? Smoking status: Never  ? Smokeless tobacco: Never  ?Substance and Sexual Activity  ? Alcohol use: Not Currently  ? Drug use: Not Currently  ? Sexual activity: Not on file  ?Other Topics Concern  ? Not on file  ?Social History Narrative  ? Not  on file  ? ?Social Determinants of Health  ? ?Financial Resource Strain: Not on file  ?Food Insecurity: Not on file  ?Transportation Needs: Not on file  ?Physical Activity: Not on file  ?Stress: Not on file  ?Social Connections: Not on file  ? ?No Known Allergies ? ?Medications  ?(Not in a hospital admission) ?  ? ?Vitals  ? ?Vitals:  ? 04/12/22 0520 04/12/22 0530  ?BP: (!) 156/94   ?Pulse: (!) 113   ?Resp: 18   ?Temp: 98.5 ?F (36.9 ?C)   ?TempSrc: Oral   ?SpO2: 100%   ?Weight:  95.3 kg  ?  ? ?Body mass index is 29.29 kg/m?. ? ?Physical Exam  ? ?General: Laying comfortably in bed; in no acute distress.  ?HENT: Normal oropharynx and mucosa. Normal external appearance of ears and nose.  ?Neck: Supple, no pain or tenderness  ?CV: No JVD. No peripheral edema.  ?Pulmonary: Symmetric Chest rise. Normal respiratory effort.  ?Abdomen: Soft to touch, non-tender.  ?Ext: No cyanosis, edema, or deformity  ?Skin: No rash. Normal palpation of skin.   ?Musculoskeletal: Normal digits and nails by inspection. No clubbing.  ? ?Neurologic Examination  ?Mental status/Cognition:  Alert, oriented to self, place, month and year, good attention.  ?Speech/language: Fluent, comprehension intact, object naming intact, repetition intact.  ?Cranial nerves:  ? CN II Pupils equal and reactive to light, no VF deficits   ? CN III,IV,VI EOM intact with mildly dysconjugate gaze, no gaze preference or deviation, no nystagmus   ? CN V normal sensation in V1, V2, and V3 segments bilaterally   ? CN VII no asymmetry, no nasolabial fold flattening  ? CN VIII normal hearing to speech  ? CN IX & X normal palatal elevation, no uvular deviation  ? CN XI 5/5 head turn and 5/5 shoulder shrug bilaterally  ? CN XII midline tongue protrusion  ? ?Motor:  ?Muscle bulk: normal, tone normal ?Mvmt Root Nerve  Muscle Right Left Comments  ?SA C5/6 Ax Deltoid     ?EF C5/6 Mc Biceps 2 5   ?EE C6/7/8 Rad Triceps 2 5   ?WF C6/7 Med FCR     ?WE C7/8 PIN ECU     ?F Ab C8/T1 U ADM/FDI 2 5   ?HF L1/2/3 Fem Illopsoas 3 5   ?KE L2/3/4 Fem Quad     ?DF L4/5 D Peron Tib Ant 2 5   ?PF S1/2 Tibial Grc/Sol 2 5   ? ?Sensation: ? Light touch Decreased in RUE and RLE to touch  ? Pin prick   ? Temperature   ? Vibration   ?Proprioception   ? ?Coordination/Complex Motor:  ?- Finger to Nose with ataxia in RUE ?- Heel to shin unable to do ?- Rapid alternating movement are slowed in RUE ?- Gait: Deferred for patient safety. ? ?Labs  ? ?CBC:  ?Recent Labs  ?Lab 04/06/22 ?1727 04/06/22 ?1801 04/07/22 ?0900  ?WBC 10.6*  --  6.5  ?NEUTROABS 7.0  --  4.2  ?HGB 17.7* 17.3* 16.3  ?HCT 51.7 51.0 46.1  ?MCV 85.7  --  85.2  ?PLT 254  --  207  ? ? ?Basic Metabolic Panel:  ?Lab Results  ?Component Value Date  ? NA 138 04/08/2022  ? K 3.8 04/08/2022  ? CO2 26 04/08/2022  ? GLUCOSE 101 (H) 04/08/2022  ? BUN 22 04/08/2022  ? CREATININE 1.39 (H) 04/08/2022  ? CALCIUM 8.7 (L) 04/08/2022  ? GFRNONAA 56 (L) 04/08/2022  ? ?Lipid Panel:  ?  Lab Results  ?Component Value Date  ? LDLCALC 101 (H) 04/07/2022  ? ?HgbA1c:  ?Lab Results  ?Component Value Date  ? HGBA1C 6.0 (H)  04/07/2022  ? ?Urine Drug Screen:  ?   ?Component Value Date/Time  ? Richards DETECTED 04/07/2022 0335  ? COCAINSCRNUR NONE DETECTED 04/07/2022 0335  ? LABBENZ NONE DETECTED 04/07/2022 0335  ? AMPHETMU NONE DETECTED 04/07/2022 0335  ? Gay DETECTED 04/07/2022 0335  ? LABBARB NONE DETECTED 04/07/2022 0335  ?  ?Alcohol Level No results found for: ETH ? ?CT Head without contrast(Personally reviewed): ?CTH was negative for a large hypodensity concerning for a large territory infarct or hyperdensity concerning for an ICH ? ?CT angio Head and Neck with contrast(Personally reviewed): ?1. No emergent finding. ?2. Intracranial atherosclerosis with most notable stenoses affecting ?the mid basilar, left proximal PCA, and right A1 origin. ?3. No flow limiting stenosis or embolic source seen in the neck. ? ?MRI Brain: ?pending ? ? ?Impression  ? ?PEDER SCARPATI is a 66 y.o. male with PMH significant for DM2, hyperthyroidism, recent left paramedian stroke on 04/06/22 with significant improvement in his symptoms. He went to bed at 2300 on 04/11/22 but reports that he was up in the bed until 0200 on 04/12/22. He woke up at 0500 and was unable to move his RUE and RLE with dizziness, double vision, diaphoertic. Felt symptoms were similar to his stroke symptoms last week but much worse.  He was not a candidate for TNKase due to recent stroke and he is not a candidate for thrombectomy due to no LVO.  I suspect that his current presentation is likely due to a stuttering lacunar wound and probably from expansion of the perforator infarct noted earlier in the left paramedian pons. ? ?He was given 2 L fluid bolus with immediate improvement in his symptoms.  Will keep head of bed flat and do maintenance fluid at 125 mils an hour for at least 2 days.- ?Recommendations  ?- Frequent Neuro checks per stroke unit protocol ?- Recommend brain imaging with MRI Brain without contrast ?- no need to repeat TTE. ?- Antithrombotic -continue  aspirin 81 mg daily Plavix 75 mg daily for 21 days followed by aspirin 81 mg daily alone.  He reports that he did take his aspirin and Plavix prior to coming to the ED today. ?- Recommend DVT ppx ?- SBP goal - per

## 2022-04-12 NOTE — ED Notes (Signed)
Sx progressed during triage to now also include diaphoresis, R sided sensation change, and worsening dysarthria. Decision made to call code stroke.  ?

## 2022-04-13 DIAGNOSIS — E538 Deficiency of other specified B group vitamins: Secondary | ICD-10-CM

## 2022-04-13 DIAGNOSIS — I651 Occlusion and stenosis of basilar artery: Secondary | ICD-10-CM

## 2022-04-13 DIAGNOSIS — I10 Essential (primary) hypertension: Secondary | ICD-10-CM

## 2022-04-13 DIAGNOSIS — I6330 Cerebral infarction due to thrombosis of unspecified cerebral artery: Secondary | ICD-10-CM

## 2022-04-13 DIAGNOSIS — E890 Postprocedural hypothyroidism: Secondary | ICD-10-CM

## 2022-04-13 DIAGNOSIS — I639 Cerebral infarction, unspecified: Secondary | ICD-10-CM

## 2022-04-13 LAB — BASIC METABOLIC PANEL WITH GFR
Anion gap: 7 (ref 5–15)
BUN: 15 mg/dL (ref 8–23)
CO2: 19 mmol/L — ABNORMAL LOW (ref 22–32)
Calcium: 8.4 mg/dL — ABNORMAL LOW (ref 8.9–10.3)
Chloride: 110 mmol/L (ref 98–111)
Creatinine, Ser: 1.26 mg/dL — ABNORMAL HIGH (ref 0.61–1.24)
GFR, Estimated: >60 mL/min (ref >60)
Glucose, Bld: 93 mg/dL (ref 70–99)
Potassium: 3.7 mmol/L (ref 3.5–5.1)
Sodium: 136 mmol/L (ref 135–145)

## 2022-04-13 LAB — CBC
HCT: 43.2 % (ref 39.0–52.0)
Hemoglobin: 14.8 g/dL (ref 13.0–17.0)
MCH: 29.5 pg (ref 26.0–34.0)
MCHC: 34.3 g/dL (ref 30.0–36.0)
MCV: 86.1 fL (ref 80.0–100.0)
Platelets: 191 10*3/uL (ref 150–400)
RBC: 5.02 MIL/uL (ref 4.22–5.81)
RDW: 12.8 % (ref 11.5–15.5)
WBC: 8.7 10*3/uL (ref 4.0–10.5)
nRBC: 0 % (ref 0.0–0.2)

## 2022-04-13 NOTE — Progress Notes (Signed)
Inpatient Rehab Admissions Coordinator:  ? ?Per therapy recommendations,  patient was screened for CIR candidacy by Hildagard Sobecki, MS, CCC-SLP. At this time, Pt. Appears to be a a potential candidate for CIR. I will place   order for rehab consult per protocol for full assessment. Please contact me any with questions. ? ?Sharika Mosquera, MS, CCC-SLP ?Rehab Admissions Coordinator  ?[phone] (celll) ?[phone] (office) ? ?

## 2022-04-13 NOTE — Progress Notes (Signed)
? ?PROGRESS NOTE ? ? ? ?KYION GAUTIER  QIO:[id number] DOB: [birth year] DOA: 04/12/2022 ?PCP: Blair Heys, MD ? ? ?Brief Narrative: ?AARIAN GRIFFIE is a 66 y.o. male with a history of hypertension, Grave's disease now with hypothyroidism, CKD stage IIIa, prediabetes who presented secondary to right sided weakness and worsening dysarthria. MRI confirmed evolution of previously diagnosed stroke. ? ? ?Assessment and Plan: ? ?Acute CVA ?Basilar artery stenosis ?MRI confirms worsening of previously diagnosed left paramedian pontine infarct. CTA head and neck significant for stenosis of mid basilar, left proximal PCA and right A1 origin. LDL of 101. Hemoglobin A1C of 6.0%. Repeat Transthoracic Echocardiogram not obtained. Neurology recommendations for Aspirin/Brilinta for one (1) month, followed by Aspirin/Plavix for two (2) months followed by aspirin monotherapy. PT/OT recommendations for inpatient rehabilitation. ? ?Acquired hypothyroidism ?TSH checked on admission and is 1.411. Patient is on Synthroid 137 mcg daily as an outpatient ?-Continue Synthroid 137 mcg daily ? ?Primary hypertension ?Patient is on amlodipine as an outpatient. Held on admission. Currently normotensive. ?-Continue to hold amlodipine ? ?CKD stage IIIa ?Stable. ? ?History of sinus bradycardia ?Noted. ? ? ?DVT prophylaxis: Lovenox ?Code Status:   Code Status: Full Code ?Family Communication: None at bedside ?Disposition Plan: Discharge to Inpatient Rehabilitation once bed is available\ ? ? ?Consultants:  ?Neurology/Stroke ? ?Procedures:  ?None ? ?Antimicrobials: ?None  ? ? ?Subjective: ?Patient is encouraged by significant improvement in his right arm strength. He still notices significant deficits.  ? ?Objective: ?BP 135/75 (BP Location: Left Arm)   Pulse (!) 58   Temp 98 ?F (36.7 ?C) (Oral)   Resp 20   Wt 95.3 kg   SpO2 100%   BMI 29.29 kg/m?  ? ?Examination: ? ?General exam: Appears calm and comfortable ?Respiratory system: Clear to  auscultation. Respiratory effort normal. ?Cardiovascular system: S1 & S2 heard, RRR. No murmurs, rubs, gallops or clicks. ?Gastrointestinal system: Abdomen is nondistended, soft and nontender. No organomegaly or masses felt. Normal bowel sounds heard. ?Central nervous system: Alert and oriented. Dysarthria. 4/5 strength in right upper/lower extremities ?Musculoskeletal: No edema. No calf tenderness ?Skin: No cyanosis. No rashes ?Psychiatry: Judgement and insight appear normal. Mood & affect appropriate.  ? ? ?Data Reviewed: I have personally reviewed following labs and imaging studies ? ?CBC ?Lab Results  ?Component Value Date  ? WBC 8.7 04/13/2022  ? RBC 5.02 04/13/2022  ? HGB 14.8 04/13/2022  ? HCT 43.2 04/13/2022  ? MCV 86.1 04/13/2022  ? MCH 29.5 04/13/2022  ? PLT 191 04/13/2022  ? MCHC 34.3 04/13/2022  ? RDW 12.8 04/13/2022  ? LYMPHSABS 2.9 04/12/2022  ? MONOABS 0.6 04/12/2022  ? EOSABS 0.1 04/12/2022  ? BASOSABS 0.0 04/12/2022  ? ? ? ?Last metabolic panel ?Lab Results  ?Component Value Date  ? NA 136 04/13/2022  ? K 3.7 04/13/2022  ? CL 110 04/13/2022  ? CO2 19 (L) 04/13/2022  ? BUN 15 04/13/2022  ? CREATININE 1.26 (H) 04/13/2022  ? GLUCOSE 93 04/13/2022  ? GFRNONAA >60 04/13/2022  ? CALCIUM 8.4 (L) 04/13/2022  ? PROT 7.3 04/12/2022  ? ALBUMIN 4.2 04/12/2022  ? BILITOT 1.7 (H) 04/12/2022  ? ALKPHOS 58 04/12/2022  ? AST 25 04/12/2022  ? ALT 22 04/12/2022  ? ANIONGAP 7 04/13/2022  ? ? ?GFR: ?CrCl cannot be calculated (Unknown ideal weight.). ? ?No results found for this or any previous visit (from the past 240 hour(s)).  ? ? ?Radiology Studies: ?MR BRAIN WO CONTRAST ? ?Result Date: 04/12/2022 ?CLINICAL DATA:  Stroke, follow up EXAM: MRI HEAD WITHOUT CONTRAST TECHNIQUE: Multiplanar, multiecho pulse sequences of the brain and surrounding structures were obtained without intravenous contrast. COMPARISON:  04/06/2022 FINDINGS: Brain: Increase in size of reduced diffusion in the left paramedian pons. No intracranial  hemorrhage. Ventricles and sulci are stable in size and configuration. Patchy foci of T2 hyperintensity in the supratentorial white matter are stable and may reflect chronic microvascular ischemic changes. There is a chronic small vessel infarct of the right corona radiata. No intracranial mass or mass effect. Vascular: Major vessel flow voids at the skull base are preserved. Skull and upper cervical spine: Normal marrow signal is preserved. Sinuses/Orbits: Paranasal sinuses are aerated. Orbits are unremarkable. Other: Sella is unremarkable.  Mastoid air cells are clear. IMPRESSION: Acute left paramedian pons infarct with increased extent of involvement since 04/06/2022. otherwise stable appearance. Electronically Signed   By: Guadlupe Spanish M.D.   On: 04/12/2022 11:57  ? ?CT HEAD CODE STROKE WO CONTRAST ? ?Result Date: 04/12/2022 ?CLINICAL DATA:  Code stroke.  Right-sided deficit EXAM: CT HEAD WITHOUT CONTRAST TECHNIQUE: Contiguous axial images were obtained from the base of the skull through the vertex without intravenous contrast. RADIATION DOSE REDUCTION: This exam was performed according to the departmental dose-optimization program which includes automated exposure control, adjustment of the mA and/or kV according to patient size and/or use of iterative reconstruction technique. COMPARISON:  Six days ago FINDINGS: Brain: Recent infarct in the left para median pons by prior MRI. Chronic small vessel ischemia in the hemispheric white matter by prior MRI, accounting for the patchy left parietal subcortical low densities which are asymmetric. Is also accounts for a chronic lacune at the right corona radiata no hemorrhage, obstructive hydrocephalus, or collection. Central predominant cerebral volume loss. Vascular: No asymmetric vessel hyperdensity Skull: Negative Sinuses/Orbits: Dysconjugate gaze, nonspecific. Other: These results were communicated to Dr. Derry Lory at 5:57 am on 04/12/2022 by text page via the  [facility] messaging system. ASPECTS [facility] Stroke Program Early CT Score) - Ganglionic level infarction (caudate, lentiform nuclei, internal capsule, insula, M1-M3 cortex): 7 - Supraganglionic infarction (M4-M6 cortex): 3 Total score (0-10 with 10 being normal): 10 IMPRESSION: 1. Known recent left pontine infarct by prior MRI. No detectable change. 2. Chronic white matter ischemia. Electronically Signed   By: Tiburcio Pea M.D.   On: 04/12/2022 05:59  ? ?CT ANGIO HEAD NECK W WO CM (CODE STROKE) ? ?Result Date: 04/12/2022 ?CLINICAL DATA:  Neuro deficit with acute stroke suspected. EXAM: CT ANGIOGRAPHY HEAD AND NECK TECHNIQUE: Multidetector CT imaging of the head and neck was performed using the standard protocol during bolus administration of intravenous contrast. Multiplanar CT image reconstructions and MIPs were obtained to evaluate the vascular anatomy. Carotid stenosis measurements (when applicable) are obtained utilizing NASCET criteria, using the distal internal carotid diameter as the denominator. RADIATION DOSE REDUCTION: This exam was performed according to the departmental dose-optimization program which includes automated exposure control, adjustment of the mA and/or kV according to patient size and/or use of iterative reconstruction technique. CONTRAST:  24mL OMNIPAQUE IOHEXOL 350 MG/ML SOLN COMPARISON:  CTA and brain MRI from 6 days ago. FINDINGS: CTA NECK FINDINGS Aortic arch: Unremarkable Right carotid system: Small mild plaque at the bifurcation without stenosis or ulceration Left carotid system: Predominately calcified plaque at the ICA bulb. No stenosis or ulceration. Vertebral arteries: No proximal subclavian stenosis. Dominant right vertebral artery. The vertebral arteries are smoothly contoured and diffusely patent Skeleton: None Other neck: Negative Upper chest: Negative Review of the MIP  images confirms the above findings CTA HEAD FINDINGS Anterior circulation: Atheromatous calcification along  the carotid siphons. No branch occlusion, beading, or aneurysm. High-grade right A1 origin Stenosis. Posterior circulation: Dominant right vertebral artery. Fetal type bilateral PCA. At least moderate narrowing at the

## 2022-04-13 NOTE — Evaluation (Signed)
Occupational Therapy Evaluation ?Patient Details ?Name: Ivan Lawson ?MRN: [id number] ?DOB: [birth year] ?Today's Date: 04/13/2022 ? ? ?History of Present Illness Pt is a 66 y.o. male recently d/c home 04/08/22 after admission for small acute ischemic stroke L paramedian pons, now admitted 04/12/22 with RLE weakness, diplopia, slurred speech. MRI shows increase in size of recent L pontine infarct, but otherwise no major changes. CTA with mid-basilar artery stenosis. Other PMH includes hyperthyroidism.  ? ?Clinical Impression ?  ?Pt admitted for concerns listed above. PTA Pt was recently released from the hospital s/p stroke, however on 04/12/22 his symptoms got worse. At this time, pt has ataxic movements in his RUE, as well as weakness. Additionally presents with an ataxic gait requiring increased assist for balance and decreased activity tolerance. Pt reports that he feels like he is having to work triple as hard to make small movements compared to normal. Recommending AIR,as pt has had a decline in function and has the potential to maximize his independence with intensive therapies. OT will follow acutely.   ?   ? ?Recommendations for follow up therapy are one component of a multi-disciplinary discharge planning process, led by the attending physician.  Recommendations may be updated based on patient status, additional functional criteria and insurance authorization.  ? ?Follow Up Recommendations ? Acute inpatient rehab (3hours/day)  ?  ?Assistance Recommended at Discharge Intermittent Supervision/Assistance  ?Patient can return home with the following A lot of help with walking and/or transfers;A little help with bathing/dressing/bathroom;Assistance with cooking/housework;Direct supervision/assist for medications management;Help with stairs or ramp for entrance;Assist for transportation ? ?  ?Functional Status Assessment ? Patient has had a recent decline in their functional status and demonstrates the ability to make  significant improvements in function in a reasonable and predictable amount of time.  ?Equipment Recommendations ? None recommended by OT  ?  ?Recommendations for Other Services Rehab consult ? ? ?  ?Precautions / Restrictions Precautions ?Precautions: Fall ?Restrictions ?Weight Bearing Restrictions: No  ? ?  ? ?Mobility Bed Mobility ?Overal bed mobility: Modified Independent ?  ?  ?  ?  ?  ?  ?General bed mobility comments: increased time and effort, reliant on momentum to power up ?  ? ?Transfers ?Overall transfer level: Needs assistance ?Equipment used: None ?Transfers: Sit to/from Stand ?Sit to Stand: Min guard ?  ?  ?  ?  ?  ?General transfer comment: Min guard for safety to standing min-mod needed with ambulation due toataxia and weakness. ?  ? ?  ?Balance Overall balance assessment: Needs assistance ?  ?Sitting balance-Leahy Scale: Fair ?  ?  ?Standing balance support: No upper extremity supported, During functional activity ?Standing balance-Leahy Scale: Fair ?Standing balance comment: can static stand without UE support; unable to accept challenge, multiple LOB with ambulation ?  ?  ?  ?  ?  ?  ?  ?  ?  ?  ?  ?   ? ?ADL either performed or assessed with clinical judgement  ? ?ADL Overall ADL's : Needs assistance/impaired ?Eating/Feeding: Set up;Sitting ?  ?Grooming: Set up;Sitting ?  ?Upper Body Bathing: Minimal assistance;Sitting ?  ?Lower Body Bathing: Moderate assistance;Sitting/lateral leans;Sit to/from stand ?  ?Upper Body Dressing : Minimal assistance;Sitting ?  ?Lower Body Dressing: Moderate assistance;Sitting/lateral leans;Sit to/from stand ?  ?Toilet Transfer: Min guard;Minimal assistance;Ambulation ?  ?Toileting- Clothing Manipulation and Hygiene: Minimal assistance;Sitting/lateral lean;Sit to/from stand ?  ?  ?  ?Functional mobility during ADLs: Min guard;Minimal assistance ?General ADL Comments: Pt limited  by ataxia noted on the R side,as well as weakness and poor motor planning and balance.   ? ? ? ?Vision Baseline Vision/History: 1 Wears glasses ?Ability to See in Adequate Light: 0 Adequate ?Patient Visual Report: No change from baseline ?Vision Assessment?: No apparent visual deficits  ?   ?Perception   ?  ?Praxis   ?  ? ?Pertinent Vitals/Pain Pain Assessment ?Pain Assessment: No/denies pain  ? ? ? ?Hand Dominance Right ?  ?Extremity/Trunk Assessment Upper Extremity Assessment ?Upper Extremity Assessment: RUE deficits/detail ?RUE Deficits / Details: Limited ROM with R shoulder horizontal abd/add/ER/IR, forearm pronation/supination, wrist and finger strength; poor coordination, ataxia noted ?RUE Sensation: decreased light touch ?RUE Coordination: decreased fine motor;decreased gross motor ?  ?Lower Extremity Assessment ?Lower Extremity Assessment: Defer to PT evaluation ?RLE Deficits / Details: pt reports baseline decreased R ankle DF; otherwise RLE strength 4-5/5; denies numbness/tingling ?RLE Sensation: history of peripheral neuropathy ?  ?Cervical / Trunk Assessment ?Cervical / Trunk Assessment: Normal ?  ?Communication Communication ?Communication: HOH ?  ?Cognition Arousal/Alertness: Awake/alert ?Behavior During Therapy: [facility] for tasks assessed/performed ?Overall Cognitive Status: No family/caregiver present to determine baseline cognitive functioning ?Area of Impairment: Attention, Following commands, Safety/judgement, Awareness, Problem solving ?  ?  ?  ?  ?  ?  ?  ?  ?  ?Current Attention Level: Selective ?  ?Following Commands: Follows multi-step commands inconsistently ?Safety/Judgement: Decreased awareness of safety, Decreased awareness of deficits ?Awareness: Emergent ?Problem Solving: Requires verbal cues ?General Comments: Very motivated. At times, pt requires cuing/increased time to process and initiate ?  ?  ?General Comments  VSS on RA ? ?  ?Exercises   ?  ?Shoulder Instructions    ? ? ?Home Living Family/patient expects to be discharged to:: Private residence ?Living Arrangements:  Alone ?Available Help at Discharge: Family;Available 24 hours/day ?Type of Home: House ?Home Access: Stairs to enter ?Entrance Stairs-Number of Steps: 6 ?Entrance Stairs-Rails: Right;Left ?Home Layout: One level ?  ?  ?Bathroom Shower/Tub: Tub/shower unit;Walk-in shower ?  ?Bathroom Toilet: Standard ?  ?  ?Home Equipment: Conservation officer, nature (2 wheels);Cane - single point;Wheelchair - manual ?  ?Additional Comments: Brother can provide support ?  ? ?  ?Prior Functioning/Environment Prior Level of Function : Independent/Modified Independent;Driving ?  ?  ?  ?  ?  ?  ?Mobility Comments: Retired, indep without DME; brother has been providing support since recent d/c home after stroke ?  ?  ? ?  ?  ?OT Problem List: Decreased strength;Impaired UE functional use;Impaired sensation ?  ?   ?OT Treatment/Interventions: Therapeutic activities;Therapeutic exercise;Self-care/ADL training;Energy conservation;DME and/or AE instruction;Patient/family education;Balance training  ?  ?OT Goals(Current goals can be found in the care plan section) Acute Rehab OT Goals ?Patient Stated Goal: To get back to his ability to work outside indepednently. ?OT Goal Formulation: With patient ?Time For Goal Achievement: 04/27/22 ?Potential to Achieve Goals: Good ?ADL Goals ?Pt Will Perform Grooming: with modified independence;standing ?Pt Will Perform Lower Body Bathing: with modified independence;sitting/lateral leans;sit to/from stand ?Pt Will Perform Lower Body Dressing: with modified independence;sitting/lateral leans;sit to/from stand ?Pt Will Transfer to Toilet: with modified independence;ambulating ?Pt Will Perform Toileting - Clothing Manipulation and hygiene: with modified independence;sit to/from stand;sitting/lateral leans ?Additional ADL Goal #1: Pt will pick up functional items with RUE with precision/no assist 75% of the time.  ?OT Frequency: Min 2X/week ?  ? ?Co-evaluation   ?  ?  ?  ?  ? ?  ?AM-PAC OT "6 Clicks" Daily Activity      ?  Outcome Measure Help from another person eating meals?: A Little ?Help from another person taking care of personal grooming?: A Little ?Help from another person toileting, which includes using toliet, bedpan,

## 2022-04-13 NOTE — Evaluation (Signed)
Clinical/Bedside Swallow Evaluation ?Patient Details  ?Name: Ivan Lawson ?MRN: [id number] ?Date of Birth: May 06, 1956 ? ?Today's Date: 04/13/2022 ?Time: SLP Start Time (ACUTE ONLY): 1250 SLP Stop Time (ACUTE ONLY): 1307 ?SLP Time Calculation (min) (ACUTE ONLY): 17 min ? ?Past Medical History:  ?Past Medical History:  ?Diagnosis Date  ? Hyperthyroidism   ? ?Past Surgical History: No past surgical history on file. ?HPI:  ?Pt is a 66 y.o. male recently d/c home 04/08/22 after admission for small acute ischemic stroke L paramedian pons, now admitted 04/12/22 with RLE weakness, diplopia, slurred speech. MRI shows increase in size of recent L pontine infarct, but otherwise no major changes. CTA with mid-basilar artery stenosis. Other PMH includes hyperthyroidism. Patient passed [facility] swallow screen however with dysarthria and MD wants to formally evaluate swallowing function.  ?  ?Assessment / Plan / Recommendation  ?Clinical Impression ? Patient presents with a functional oropharyngeal swallow. Mild-moderate right sided facial weakness and right sided lingual deviation result in mild dysarthria but is not impacting swallowing function. Patient with independent use of safe swallowing strategies which I would recommend continuing to decrease aspiration risks. No SLP f/u for dysphagia indicated. Noted possible plans for CIR. Would recommend SLP evaluation on CIR to address dysarthria. ?SLP Visit Diagnosis: Dysphagia, unspecified (R13.10) ?   ?   ?Diet Recommendation Regular;Thin liquid  ? ?Liquid Administration via: Cup;Straw ?Medication Administration: Whole meds with liquid ?Supervision: Patient able to self feed ?Compensations: Slow rate;Small sips/bites ?Postural Changes: Seated upright at 90 degrees  ?  ?Other  Recommendations Oral Care Recommendations: Oral care BID   ? ?Recommendations for follow up therapy are one component of a multi-disciplinary discharge planning process, led by the attending physician.   Recommendations may be updated based on patient status, additional functional criteria and insurance authorization. ? ?Follow up Recommendations Acute inpatient rehab (3hours/day)  ? ? ?  ?Assistance Recommended at Discharge None  ? ? ?Swallow Study   ?General HPI: Pt is a 66 y.o. male recently d/c home 04/08/22 after admission for small acute ischemic stroke L paramedian pons, now admitted 04/12/22 with RLE weakness, diplopia, slurred speech. MRI shows increase in size of recent L pontine infarct, but otherwise no major changes. CTA with mid-basilar artery stenosis. Other PMH includes hyperthyroidism. Patient passed [facility] swallow screen however with dysarthria and MD wants to formally evaluate swallowing function. ?Type of Study: Bedside Swallow Evaluation ?Previous Swallow Assessment: none ?Diet Prior to this Study: Regular;Thin liquids ?Temperature Spikes Noted: No ?Respiratory Status: Room air ?History of Recent Intubation: No ?Behavior/Cognition: Alert;Cooperative;Pleasant mood ?Oral Cavity Assessment: Dried secretions (labial corners) ?Oral Care Completed by SLP: Yes ?Oral Cavity - Dentition: Adequate natural dentition ?Vision: Functional for self-feeding ?Self-Feeding Abilities: Able to feed self ?Patient Positioning: Upright in bed ?Baseline Vocal Quality: Normal ?Volitional Cough: Strong ?Volitional Swallow: Able to elicit  ?  ?Oral/Motor/Sensory Function Overall Oral Motor/Sensory Function: Moderate impairment ?Facial ROM: Reduced right ?Facial Symmetry: Abnormal symmetry right ?Facial Strength: Reduced right ?Facial Sensation: Within Functional Limits ?Lingual ROM: Within Functional Limits ?Lingual Symmetry: Abnormal symmetry right ?Lingual Strength: Within Functional Limits ?Lingual Sensation: Within Functional Limits ?Velum: Within Functional Limits ?Mandible: Within Functional Limits   ?Ice Chips Ice chips: Not tested   ?Thin Liquid Thin Liquid: Within functional limits ?Presentation: Cup;Self Fed;Straw  ?   ?Nectar Thick Nectar Thick Liquid: Not tested   ?Honey Thick Honey Thick Liquid: Not tested   ?Puree Puree: Within functional limits ?Presentation: Spoon;Self Fed   ?Solid ? ? ?  Solid:  Within functional limits ?Presentation: Self Fed  ? ?  ?Ivan Lesh MA, CCC-SLP ? ?Ivan Lawson ?04/13/2022,1:14 PM ? ? ? ?

## 2022-04-13 NOTE — Progress Notes (Addendum)
STROKE TEAM PROGRESS NOTE  ? ?INTERVAL HISTORY ?Patient is seen in his room with no family at the bedside.  He reports that his symptoms have improved overnight and demonstrates movement of the right hand.  He states that his dizziness has resolved except for one episode that occurred when he stood for the first time.  His vital signs have been stable. ? ?Vitals:  ? 04/12/22 1951 04/12/22 2320 04/13/22 0333 04/13/22 0729  ?BP: (!) 142/73 140/72 134/73 135/75  ?Pulse: (!) 59 61 (!) 56 (!) 58  ?Resp: 15 15 15 20   ?Temp: 98.1 ?F (36.7 ?C) 97.9 ?F (36.6 ?C) 97.8 ?F (36.6 ?C) 98 ?F (36.7 ?C)  ?TempSrc: Oral Oral Oral Oral  ?SpO2: 100% 97% 100% 100%  ?Weight:      ? ?CBC:  ?Recent Labs  ?Lab 04/07/22 ?0900 04/12/22 ?06/12/22 04/12/22 ?06/12/22 04/12/22 ?0820 04/13/22 ?0103  ?WBC 6.5 8.2  --  9.1 8.7  ?NEUTROABS 4.2 4.5  --   --   --   ?HGB 16.3 17.4*   < > 15.8 14.8  ?HCT 46.1 49.5   < > 46.9 43.2  ?MCV 85.2 85.1  --  85.3 86.1  ?PLT 207 239  --  209 191  ? < > = values in this interval not displayed.  ? ? ?Basic Metabolic Panel:  ?Recent Labs  ?Lab 04/07/22 ?0900 04/08/22 ?0722 04/12/22 ?06/12/22 04/12/22 ?06/12/22 04/12/22 ?0820 04/13/22 ?0103  ?NA 137 138 136 139  --  136  ?K 3.9 3.8 3.7 3.8  --  3.7  ?CL 104 104 105 104  --  110  ?CO2 24 26 22   --   --  19*  ?GLUCOSE 101* 101* 102* 96  --  93  ?BUN 15 22 19 21   --  15  ?CREATININE 1.35* 1.39* 1.39* 1.30* 1.27* 1.26*  ?CALCIUM 9.2 8.7* 9.1  --   --  8.4*  ?MG 1.9 1.9  --   --   --   --   ? ? ?Lipid Panel:  ?Recent Labs  ?Lab 04/07/22 ?0900  ?CHOL 153  ?TRIG 72  ?HDL 38*  ?CHOLHDL 4.0  ?VLDL 14  ?LDLCALC 101*  ? ? ?HgbA1c:  ?Recent Labs  ?Lab 04/07/22 ?0900  ?HGBA1C 6.0*  ? ? ?Urine Drug Screen:  ?Recent Labs  ?Lab 04/07/22 ?0335  ?LABOPIA NONE DETECTED  ?COCAINSCRNUR NONE DETECTED  ?LABBENZ NONE DETECTED  ?AMPHETMU NONE DETECTED  ?THCU NONE DETECTED  ?LABBARB NONE DETECTED  ? ?  ?Alcohol Level No results for input(s): ETH in the last 168 hours. ? ?IMAGING past 24 hours ?MR BRAIN WO  CONTRAST ? ?Result Date: 04/12/2022 ?CLINICAL DATA:  Stroke, follow up EXAM: MRI HEAD WITHOUT CONTRAST TECHNIQUE: Multiplanar, multiecho pulse sequences of the brain and surrounding structures were obtained without intravenous contrast. COMPARISON:  04/06/2022 FINDINGS: Brain: Increase in size of reduced diffusion in the left paramedian pons. No intracranial hemorrhage. Ventricles and sulci are stable in size and configuration. Patchy foci of T2 hyperintensity in the supratentorial white matter are stable and may reflect chronic microvascular ischemic changes. There is a chronic small vessel infarct of the right corona radiata. No intracranial mass or mass effect. Vascular: Major vessel flow voids at the skull base are preserved. Skull and upper cervical spine: Normal marrow signal is preserved. Sinuses/Orbits: Paranasal sinuses are aerated. Orbits are unremarkable. Other: Sella is unremarkable.  Mastoid air cells are clear. IMPRESSION: Acute left paramedian pons infarct with increased extent of involvement since 04/06/2022. otherwise  stable appearance. Electronically Signed   By: Guadlupe SpanishPraneil  Patel M.D.   On: 04/12/2022 11:57   ? ?PHYSICAL EXAM ?General:  Alert, well-developed, well-nourished middle-age Caucasian male patient in no acute distress. ?Respiratory:  Regular, unlabored respirations on room air ? ?NEURO:  ?Mental Status: AA&Ox3  ?Speech/Language: speech is with mild dysarthria and hypophonia. ? ?Cranial Nerves:  ?II: PERRL. Visual fields full.  ?III, IV, VI: EOMI. Eyelids elevate symmetrically.  ?V: Sensation is intact to light touch and slightly diminished on the right ?VII: Right mild lower facial droop present ?VIII: hearing intact to voice. ?XII: tongue is midline without fasciculations. ?Motor: 5/5 strength to LUE and LLE, 4/5 to RUE, 4+/5 to RLE ?Tone: is normal and bulk is normal ?Sensation- Intact to light touch bilaterally.  ?Coordination: No drift on left ?Gait- deferred ? ?NIH 4   Premorbid MRS  1 ? ?ASSESSMENT/PLAN ?Mr. Chilton SiJoey C Ginger is a 66 y.o. male with history of left paramedian pontine stroke, DM2 and hyperthyroidism presenting with right sided weakness, dizziness, diaphoresis and diplopia.  He was seen here for a paramedian pontine stroke on 5/6, and his symptoms had improved dramatically until 5/12.  His symptoms prior to this admission are similar to but worse than the stroke symptoms he had last week.   ? ?Stroke: Extension of left paramedian pontine infarct likely secondary due to basilar artery stenosis ?Code Stroke CT head No acute abnormality. Small vessel disease. Known left pontine infarct. ASPECTS 10.    ?CTA head & neck Stenoses in mid basilar, left proximal PCA and right A1 origin ?MRI extension of previous left pontine infarct ?2D Echo EF 60-65%, no atrial level shunt ?LDL 101 ?HgbA1c 6.0 ?VTE prophylaxis - lovenox ?aspirin 81 mg daily and clopidogrel 75 mg daily prior to admission, now on aspirin 81 mg daily and Brilinta (ticagrelor) 90 mg bid for 1 month and then back to aspirin Plavix for 2 months and then aspirin alone given large vessel stenosis.  ?Therapy recommendations: CIR ?Disposition:  pending ? ?Recent stroke ?Admitted on 04/06/2022 for right hand/foot numbness progressed to right arm/leg numbness and weakness, gait imbalance and slurred speech.  CT showed old right CR, left parietal infarcts.  CT head and neck left P1 50% stenosis, basilar artery moderate stenosis.  MRI showed left pontine small infarct.  EF 60 to 65%.  LDL 1 1, A1c 6.0.  Discharged on DAPT and Crestor 20. ? ?Hypertension ?Home meds:  hydralazine 50 mg TID, amlodipine 10 mg daily ?Stable ?Avoid low BP given basilar artery stenosis ?Long-term BP goal normotensive ? ?Hyperlipidemia ?Home meds:  rosuvastatin 20 mg daily, resumed in hospital ?LDL 101, goal < 70 ?Continue statin at discharge ? ?Diabetes type II Controlled ?Home meds:  none ?HgbA1c 6.0, goal < 7.0 ?CBGs ?SSI ?Close PCP follow-up ? ?Other Stroke  Risk Factors ?Advanced Age >/= 265  ? ?Other Active Problems ?Acquired hypothyroidism, on Synthroid ? ?Hospital day # 1 ? ?Cortney E Ernestina Columbiae La Torre , MSN, AGACNP-BC ?[facility] ?See Amion for schedule and pager information ?04/13/2022 11:11 AM ? ?ATTENDING NOTE: ?I reviewed above note and agree with the assessment and plan. Pt was seen and examined.  ? ?66 year old male with history of diabetes, acquired hypothyroidism, recent stroke admitted for right-sided weakness, dizziness, diplopia and sweating.  He was admitted on 04/06/2022 for right hand/foot numbness progressed to right arm/leg numbness and weakness, gait imbalance and slurred speech.  CT showed old right CR, left parietal infarcts.  CT head and neck left P1  50% stenosis, basilar artery moderate stenosis.  MRI showed left pontine small infarct.  EF 60 to 65%.  LDL 1 1, A1c 6.0.  Discharged on DAPT and Crestor 20. ? ?On this admission, CT no acute abnormality.  MRI showed left pontine stroke extension from prior.  CT head and neck unchanged, left P1 stenosis, moderate mid basilar artery stenosis.  Creatinine 1.30. ? ?On exam, patient son at bedside.  Patient awake alert, orientated x3, no aphasia, mild dysarthria, follows some commands.  Able to name repeat.  No gaze palsy, no diplopia, no nystagmus, visual field full.  Mild right facial droop.  Tongue midline.  Right upper extremity 4/5, finger grip 3+/5.  Right lower extremity proximal 4/5, distal ankle DF 4/5.  Left lower extremity 5/5, left lower extremity proximal 4/5, distal ankle DF 5/5.  Sensation symmetrical.  Right finger-to-nose ataxic but not out of proportion to the weakness. ? ?Etiology for patient stroke still likely large vessel disease with moderate basilar artery stenosis.  Recommend to avoid low BP, educated on BP monitoring at home.  Continue aspirin 81 and Brilinta for 1 month and then back to aspirin and Plavix for 2 more months and then aspirin alone.  Continue Crestor 20.   PT/OT recommend CIR. ? ?For detailed assessment and plan, please refer to above as I have made changes wherever appropriate.  ? ?Neurology will sign off. Please call with questions. Pt will follow up with Dr. Cleon Dew

## 2022-04-13 NOTE — Plan of Care (Signed)
Pt is alert oriented x 4. Pt has been laying flat per order. No distress noted. Pt denies numbness and tingling to right side but does have weakness, ataxia, and a drift. Pt has condom cath in place for urine out put. Pt denies pain, but verbalized spasms to right leg, PRN robaxin given with effective results.  ? ? ?Problem: Education: ?Goal: Knowledge of General Education information will improve ?Description: Including pain rating scale, medication(s)/side effects and non-pharmacologic comfort measures ?Outcome: Progressing ?  ?Problem: Health Behavior/Discharge Planning: ?Goal: Ability to manage health-related needs will improve ?Outcome: Progressing ?  ?Problem: Clinical Measurements: ?Goal: Ability to maintain clinical measurements within normal limits will improve ?Outcome: Progressing ?Goal: Will remain free from infection ?Outcome: Progressing ?Goal: Diagnostic test results will improve ?Outcome: Progressing ?Goal: Respiratory complications will improve ?Outcome: Progressing ?Goal: Cardiovascular complication will be avoided ?Outcome: Progressing ?  ?Problem: Activity: ?Goal: Risk for activity intolerance will decrease ?Outcome: Progressing ?  ?Problem: Nutrition: ?Goal: Adequate nutrition will be maintained ?Outcome: Progressing ?  ?Problem: Coping: ?Goal: Level of anxiety will decrease ?Outcome: Progressing ?  ?Problem: Elimination: ?Goal: Will not experience complications related to bowel motility ?Outcome: Progressing ?Goal: Will not experience complications related to urinary retention ?Outcome: Progressing ?  ?Problem: Pain Managment: ?Goal: General experience of comfort will improve ?Outcome: Progressing ?  ?Problem: Safety: ?Goal: Ability to remain free from injury will improve ?Outcome: Progressing ?  ?Problem: Skin Integrity: ?Goal: Risk for impaired skin integrity will decrease ?Outcome: Progressing ?  ?Problem: Education: ?Goal: Knowledge of disease or condition will improve ?Outcome:  Progressing ?Goal: Knowledge of secondary prevention will improve (SELECT ALL) ?Outcome: Progressing ?Goal: Knowledge of patient specific risk factors will improve (INDIVIDUALIZE FOR PATIENT) ?Outcome: Progressing ?Goal: Individualized Educational Video(s) ?Outcome: Progressing ?  ?Problem: Coping: ?Goal: Will verbalize positive feelings about self ?Outcome: Progressing ?Goal: Will identify appropriate support needs ?Outcome: Progressing ?  ?Problem: Health Behavior/Discharge Planning: ?Goal: Ability to manage health-related needs will improve ?Outcome: Progressing ?  ?Problem: Self-Care: ?Goal: Ability to participate in self-care as condition permits will improve ?Outcome: Progressing ?Goal: Verbalization of feelings and concerns over difficulty with self-care will improve ?Outcome: Progressing ?Goal: Ability to communicate needs accurately will improve ?Outcome: Progressing ?  ?

## 2022-04-13 NOTE — Evaluation (Signed)
Physical Therapy Evaluation ?Patient Details ?Name: Ivan Lawson ?MRN: [id number] ?DOB: 02-Dec-1956 ?Today's Date: 04/13/2022 ? ?History of Present Illness ? Pt is a 66 y.o. male recently d/c home 04/08/22 after admission for small acute ischemic stroke L paramedian pons, now admitted 04/12/22 with RLE weakness, diplopia, slurred speech. MRI shows increase in size of recent L pontine infarct, but otherwise no major changes. CTA with mid-basilar artery stenosis. Other PMH includes hyperthyroidism. ?  ?Clinical Impression ? Pt presents with an overall decrease in functional mobility secondary to above. PTA, pt typically independent without DME, lives alone, retired from work; pt recently d/c home 04/08/22 after admission for same stroke with significant improvement in symptoms, back to indep functioning at home. Today, pt with functional mobility impairments related to RUE, coordination, dysarthria, cognition(?), and gait/balance. Pt with ataxic-like gait pattern requiring frequent mod-maxA to prevent LOB with ambulation and mobility tasks. Pending dramatic symptom improvement, pt would benefit from intensive CIR-level therapies  to maximize functional mobility and independence prior to return home.     ? ?Recommendations for follow up therapy are one component of a multi-disciplinary discharge planning process, led by the attending physician.  Recommendations may be updated based on patient status, additional functional criteria and insurance authorization. ? ?Follow Up Recommendations Acute inpatient rehab (3hours/day) ? ?  ?Assistance Recommended at Discharge Frequent or constant Supervision/Assistance  ?Patient can return home with the following ? A little help with walking and/or transfers;A lot of help with bathing/dressing/bathroom;Assistance with cooking/housework;Assist for transportation;Help with stairs or ramp for entrance ? ?  ?Equipment Recommendations  (TBD)  ?Recommendations for Other Services ?   OT, SLP ?   ?Functional Status Assessment Patient has had a recent decline in their functional status and demonstrates the ability to make significant improvements in function in a reasonable and predictable amount of time.  ? ?  ?Precautions / Restrictions Precautions ?Precautions: Fall ?Restrictions ?Weight Bearing Restrictions: No  ? ?  ? ?Mobility ? Bed Mobility ?Overal bed mobility: Modified Independent ?  ?  ?  ?  ?  ?  ?General bed mobility comments: increased time and effort, reliant on momentum to power up ?  ? ?Transfers ?Overall transfer level: Needs assistance ?Equipment used: None ?Transfers: Sit to/from Stand ?Sit to Stand: Min guard ?  ?  ?  ?  ?  ?  ?  ? ?Ambulation/Gait ?Ambulation/Gait assistance: Min assist, Mod assist, Max assist ?Gait Distance (Feet): 100 Feet ?Assistive device: None ?Gait Pattern/deviations: Step-through pattern, Decreased stride length, Ataxic, Staggering left, Staggering right, Decreased dorsiflexion - right, Scissoring ?  ?  ?  ?General Gait Details: very unsteady, ataxic-like gait with frequent mod-maxA to prevent LOB in multiple directions; pt with decreased reaction times in attempts to correct LOB ? ?Stairs ?  ?  ?  ?  ?  ? ?Wheelchair Mobility ?  ? ?Modified Rankin (Stroke Patients Only) ?Modified Rankin (Stroke Patients Only) ?Pre-Morbid Rankin Score: No significant disability ?Modified Rankin: Moderately severe disability ? ?  ? ?Balance Overall balance assessment: Needs assistance ?  ?Sitting balance-Leahy Scale: Fair ?  ?  ?Standing balance support: No upper extremity supported, During functional activity ?Standing balance-Leahy Scale: Fair ?Standing balance comment: can static stand without UE support; unable to accept challenge, multiple LOB with ambulation ?  ?  ?  ?  ?  ?  ?  ?  ?  ?  ?  ?   ? ? ? ?Pertinent Vitals/Pain Pain Assessment ?Pain Assessment: Faces ?Faces Pain  Scale: Hurts a little bit ?Pain Location: back, R shoulder ?Pain Descriptors / Indicators: Discomfort,  Grimacing ?Pain Intervention(s): Monitored during session  ? ? ?Home Living Family/patient expects to be discharged to:: Private residence ?Living Arrangements: Alone ?Available Help at Discharge: Family;Available 24 hours/day ?Type of Home: House ?Home Access: Stairs to enter ?Entrance Stairs-Rails: Right;Left ?Entrance Stairs-Number of Steps: 6 ?  ?Home Layout: One level ?Home Equipment: Agricultural consultant (2 wheels);Cane - single point;Wheelchair - manual ?Additional Comments: Brother can provide support  ?  ?Prior Function Prior Level of Function : Independent/Modified Independent;Driving ?  ?  ?  ?  ?  ?  ?Mobility Comments: Retired, indep without DME; brother has been providing support since recent d/c home after stroke ?  ?  ? ? ?Hand Dominance  ? Dominant Hand: Right ? ?  ?Extremity/Trunk Assessment  ? Upper Extremity Assessment ?Upper Extremity Assessment: RUE deficits/detail ?RUE Deficits / Details: not formally assessed - most significant deficits with R shoulder horizontal abd/add/ER/IR, forearm pronation/supination, wrist and finger strength; poor coordination ?RUE Sensation: decreased light touch ?RUE Coordination: decreased fine motor;decreased gross motor ?  ? ?Lower Extremity Assessment ?Lower Extremity Assessment: RLE deficits/detail ?RLE Deficits / Details: pt reports baseline decreased R ankle DF; otherwise RLE strength 4-5/5; denies numbness/tingling ?RLE Sensation: history of peripheral neuropathy ?  ? ?Cervical / Trunk Assessment ?Cervical / Trunk Assessment: Normal  ?Communication  ? Communication: HOH  ?Cognition Arousal/Alertness: Awake/alert ?Behavior During Therapy: [facility] for tasks assessed/performed ?Overall Cognitive Status: No family/caregiver present to determine baseline cognitive functioning ?Area of Impairment: Attention, Following commands, Safety/judgement, Awareness, Problem solving ?  ?  ?  ?  ?  ?  ?  ?  ?  ?Current Attention Level: Selective ?  ?Following Commands: Follows  multi-step commands inconsistently ?Safety/Judgement: Decreased awareness of safety, Decreased awareness of deficits ?Awareness: Emergent ?Problem Solving: Requires verbal cues ?General Comments: pleasant and motivated to participate; unsure if apparent cognitive deficits exacerbated by pt HOH ?  ?  ? ?  ?General Comments   ? ?  ?Exercises    ? ?Assessment/Plan  ?  ?PT Assessment Patient needs continued PT services  ?PT Problem List Decreased strength;Decreased activity tolerance;Decreased balance;Decreased mobility;Decreased coordination;Decreased cognition ? ?   ?  ?PT Treatment Interventions     ? ?PT Goals (Current goals can be found in the Care Plan section)  ?Acute Rehab PT Goals ?Patient Stated Goal: willing to do whatever he needs to do to get well ?PT Goal Formulation: With patient ?Time For Goal Achievement: 04/27/22 ?Potential to Achieve Goals: Good ? ?  ?Frequency   ?  ? ? ?Co-evaluation   ?  ?  ?  ?  ? ? ?  ?AM-PAC PT "6 Clicks" Mobility  ?Outcome Measure Help needed turning from your back to your side while in a flat bed without using bedrails?: A Little ?Help needed moving from lying on your back to sitting on the side of a flat bed without using bedrails?: A Little ?Help needed moving to and from a bed to a chair (including a wheelchair)?: A Lot ?Help needed standing up from a chair using your arms (e.g., wheelchair or bedside chair)?: A Little ?Help needed to walk in hospital room?: A Lot ?Help needed climbing 3-5 steps with a railing? : A Lot ?6 Click Score: 15 ? ?  ?End of Session Equipment Utilized During Treatment: Gait belt ?Activity Tolerance: Patient tolerated treatment well ?Patient left: in bed;with call bell/phone within reach;with bed alarm set ?Nurse  Communication: Mobility status ?PT Visit Diagnosis: Other abnormalities of gait and mobility (R26.89);Other symptoms and signs involving the nervous system (R29.898) ?  ? ?Time: 4098-1191 ?PT Time Calculation (min) (ACUTE ONLY): 24  min ? ? ?Charges:   PT Evaluation ?$PT Eval Moderate Complexity: 1 Mod ?  ?  ?   ?Ina Homes, PT, DPT ?Acute Rehabilitation Services  ?Pager [phone] ?Office [phone] ? ?Ivan Lawson ?04/13/2022, 8:59 AM

## 2022-04-13 NOTE — Progress Notes (Signed)
Inpatient Rehab Admissions Coordinator:  ? ? I spoke with pt and family regarding potential CIR admit. They state interest and indicate that family can provide 24/7 support at d/c. I will follow for potential admit pending insurance auth and bed availability. ? ?Tayo Maute, MS, CCC-SLP ?Rehab Admissions Coordinator  ?[phone] (celll) ?[phone] (office) ? ?

## 2022-04-14 NOTE — Progress Notes (Signed)
? ?  PROGRESS NOTE ? ? ? ?YANIXAN MELLINGER  VPX:[id number] DOB: 03/21/1956 DOA: 04/12/2022 ?PCP: Blair Heys, MD ? ? ?Brief Narrative: ?TOMAS SCHAMP is a 66 y.o. male with a history of hypertension, Grave's disease now with hypothyroidism, CKD stage IIIa, prediabetes who presented secondary to right sided weakness and worsening dysarthria. MRI confirmed evolution of previously diagnosed stroke. ? ? ?Assessment and Plan: ? ?Acute CVA ?Basilar artery stenosis ?MRI confirms worsening of previously diagnosed left paramedian pontine infarct. CTA head and neck significant for stenosis of mid basilar, left proximal PCA and right A1 origin. LDL of 101. Hemoglobin A1C of 6.0%. Repeat Transthoracic Echocardiogram not obtained. Neurology recommendations for Aspirin/Brilinta for one (1) month, followed by Aspirin/Plavix for two (2) months followed by aspirin monotherapy. PT/OT recommendations for inpatient rehabilitation. ? ?Acquired hypothyroidism ?TSH checked on admission and is 1.411. Patient is on Synthroid 137 mcg daily as an outpatient ?-Continue Synthroid 137 mcg daily ? ?Primary hypertension ?Patient is on amlodipine as an outpatient. Held on admission. Currently normotensive. ?-Continue to hold amlodipine ? ?CKD stage IIIa ?Stable. ? ?History of sinus bradycardia ?Noted. ? ? ?DVT prophylaxis: Lovenox ?Code Status:   Code Status: Full Code ?Family Communication: Brother at bedside ?Disposition Plan: Discharge to Inpatient Rehabilitation once bed is available ? ? ?Consultants:  ?Neurology/Stroke ? ?Procedures:  ?None ? ?Antimicrobials: ?None  ? ? ?Subjective: ?Continues to have improvement in his function. ? ?Objective: ?BP (!) 153/71 (BP Location: Left Arm)   Pulse 66   Temp 97.7 ?F (36.5 ?C) (Oral)   Resp 20   Wt 95.3 kg   SpO2 100%   BMI 29.29 kg/m?  ? ?Examination: ? ?General exam: Appears calm and comfortable ?Respiratory system: Respiratory effort normal. ?Gastrointestinal system: Abdomen is  non-distended ?Central nervous system: Alert and oriented. ?Musculoskeletal: No edema.  ?Psychiatry: Judgement and insight appear normal. Mood & affect appropriate.  ? ? ?Data Reviewed: I have personally reviewed following labs and imaging studies ? ?CBC ?Lab Results  ?Component Value Date  ? WBC 8.7 04/13/2022  ? RBC 5.02 04/13/2022  ? HGB 14.8 04/13/2022  ? HCT 43.2 04/13/2022  ? MCV 86.1 04/13/2022  ? MCH 29.5 04/13/2022  ? PLT 191 04/13/2022  ? MCHC 34.3 04/13/2022  ? RDW 12.8 04/13/2022  ? LYMPHSABS 2.9 04/12/2022  ? MONOABS 0.6 04/12/2022  ? EOSABS 0.1 04/12/2022  ? BASOSABS 0.0 04/12/2022  ? ? ? ?Last metabolic panel ?Lab Results  ?Component Value Date  ? NA 136 04/13/2022  ? K 3.7 04/13/2022  ? CL 110 04/13/2022  ? CO2 19 (L) 04/13/2022  ? BUN 15 04/13/2022  ? CREATININE 1.26 (H) 04/13/2022  ? GLUCOSE 93 04/13/2022  ? GFRNONAA >60 04/13/2022  ? CALCIUM 8.4 (L) 04/13/2022  ? PROT 7.3 04/12/2022  ? ALBUMIN 4.2 04/12/2022  ? BILITOT 1.7 (H) 04/12/2022  ? ALKPHOS 58 04/12/2022  ? AST 25 04/12/2022  ? ALT 22 04/12/2022  ? ANIONGAP 7 04/13/2022  ? ? ?GFR: ?CrCl cannot be calculated (Unknown ideal weight.). ? ?No results found for this or any previous visit (from the past 240 hour(s)).  ? ? ?Radiology Studies: ?No results found. ? ? ? LOS: 2 days  ? ? ?Jacquelin Hawking, MD ?[facility] ?04/14/2022, 12:12 PM ? ? ?If 7PM-7AM, please contact night-coverage ?[email or web address] ? ?

## 2022-04-14 NOTE — PMR Pre-admission (Signed)
PMR Admission Coordinator Pre-Admission Assessment ? ?Patient: Ivan Lawson is an 66 y.o., male ?MRN: [id number] ?DOB: 20-Jan-1956 ?Height:   ?Weight: 95.3 kg ? ?Insurance Information ?HMO:     PPO: yes     PCP:      IPA:      80/20:      OTHER:  ?PRIMARY: [employer]       Policy#: [id number]      Subscriber: Pt.  ?CM Name: Lynelle Smoke      Phone#: [phone]     Fax#: [phone] I received a call drom [place] at [facility] stating Pt. Was approved for 7 days from date of admission on 04/14/22 ?Pre-Cert#: [id number]      Employer:  ?Benefits:  Phone #:      Name: Andee Poles  ?Eff. Date: 12/02/21     Deduct: $0      Out of Pocket Max: $3200.00 ($590.00 met)      Life Max: na ?CIR: $295.00 for days 1-6, $0 for days 7-90      SNF: $0 for days 1-20, $184 a day for days 20-100 ?Outpatient:      Co-Pay: $15.00  ?Home Health: 100%      Co-Pay: 0 ?DME: 20% coinsurance       Co-Pay:  ?Providers: in network  ?SECONDARY:       Policy#:      Phone#:  ? ?Financial Counselor:       Phone#:  ? ?The ?Data Collection Information Summary? for patients in Inpatient Rehabilitation Facilities with attached ?Privacy Act [place] Records? was provided and verbally reviewed with: Patient ? ?Emergency Contact Information ?Contact Information   ? ? Name Relation Home Work Mobile  ? Faulstich,Sam Brother [phone]    ? ?  ? ? ?Current Medical History  ?Patient Admitting Diagnosis: CVA ?History of Present Illness: Ivan Lawson is a 66 y.o. male with medical history significant for essential hypertension, acquired hypothyroidism following radioactive iodine therapy for Graves' disease, prediabetes, who was admitted to [facility] on 04/12/2022 for further evaluation/management of potential stroke after presenting from home to [facility] ED complaining of right-sided numbness. Pt. Also with a history of a recent ischemic stroke (5/6) with right-sided weakness that had almost completely resolved. MRI which showed new acute left  paramedian pons infarction with increased extent of involvement compared with 04/06/2022. PT/OT were consulted and recommended CIR to assist return to PLOF.  ?Complete NIHSS TOTAL: 7 ? ?Patient's medical record from [facility] has been reviewed by the rehabilitation admission coordinator and physician. ? ?Past Medical History  ?Past Medical History:  ?Diagnosis Date  ? Hyperthyroidism   ? ? ?Has the patient had major surgery during 100 days prior to admission? No ? ?Family History   ?family history is not on file. ? ?Current Medications ? ?Current Facility-Administered Medications:  ?  acetaminophen (TYLENOL) tablet 650 mg, 650 mg, Oral, Q6H PRN **OR** acetaminophen (TYLENOL) suppository 650 mg, 650 mg, Rectal, Q6H PRN, Samella Parr, NP ?  aspirin EC tablet 81 mg, 81 mg, Oral, Daily, Samella Parr, NP, 81 mg at 04/13/22 1105 ?  enoxaparin (LOVENOX) injection 40 mg, 40 mg, Subcutaneous, Q24H, Samella Parr, NP, 40 mg at 04/13/22 1823 ?  levothyroxine (SYNTHROID) tablet 137 mcg, 137 mcg, Oral, Q0600, Samella Parr, NP, 137 mcg at 04/14/22 5701 ?  methocarbamol (ROBAXIN) 500 mg in dextrose 5 % 50 mL IVPB, 500 mg, Intravenous, Q6H PRN, Samella Parr, NP, Last  Rate: 100 mL/hr at 04/13/22 2057, 500 mg at 04/13/22 2057 ?  ondansetron (ZOFRAN) tablet 4 mg, 4 mg, Oral, Q6H PRN **OR** ondansetron (ZOFRAN) injection 4 mg, 4 mg, Intravenous, Q6H PRN, Samella Parr, NP ?  rosuvastatin (CRESTOR) tablet 20 mg, 20 mg, Oral, Daily, Erin Hearing L, NP, 20 mg at 04/13/22 1105 ?  senna-docusate (Senokot-S) tablet 1 tablet, 1 tablet, Oral, QHS PRN, Samella Parr, NP ?  sodium chloride flush (NS) 0.9 % injection 3 mL, 3 mL, Intravenous, Once, Mesner, Corene Cornea, MD ?  sodium chloride flush (NS) 0.9 % injection 3 mL, 3 mL, Intravenous, Q12H, Samella Parr, NP, 3 mL at 04/13/22 2228 ?  ticagrelor (BRILINTA) tablet 90 mg, 90 mg, Oral, BID, de Yolanda Manges, Thayer E, NP, 90 mg at 04/13/22 2058 ?  vitamin  B-12 (CYANOCOBALAMIN) tablet 1,000 mcg, 1,000 mcg, Oral, Daily, Samella Parr, NP, 1,000 mcg at 04/13/22 1106 ? ?Patients Current Diet:  ?Diet Order   ? ?       ?  Diet Heart Room service appropriate? Yes; Fluid consistency: Thin  Diet effective now       ?  ? ?  ?  ? ?  ? ? ?Precautions / Restrictions ?Precautions ?Precautions: Fall ?Precaution Comments: PT active in the community PTA ?Restrictions ?Weight Bearing Restrictions: No  ? ?Has the patient had 2 or more falls or a fall with injury in the past year? Unknown ? ?Prior Activity Level ?Community (5-7x/wk): Pt. was active in the community PTA ? ?Prior Functional Level ?Self Care: Did the patient need help bathing, dressing, using the toilet or eating? Independent ? ?Indoor Mobility: Did the patient need assistance with walking from room to room (with or without device)? Independent ? ?Stairs: Did the patient need assistance with internal or external stairs (with or without device)? Independent ? ?Functional Cognition: Did the patient need help planning regular tasks such as shopping or remembering to take medications? Independent ? ?Patient Information ?Are you of Hispanic, Latino/a,or Spanish origin?: A. No, not of Hispanic, Latino/a, or Spanish origin ?What is your race?: A. White ?Do you need or want an interpreter to communicate with a doctor or health care staff?: 0. No ? ?Patient's Response To:  ?Health Literacy and Transportation ?Is the patient able to respond to health literacy and transportation needs?: Yes ?Health Literacy - How often do you need to have someone help you when you read instructions, pamphlets, or other written material from your doctor or pharmacy?: Never ?In the past 12 months, has lack of transportation kept you from medical appointments or from getting medications?: No ?In the past 12 months, has lack of transportation kept you from meetings, work, or from getting things needed for daily living?: No ? ?Home Assistive Devices /  Equipment ?Home Equipment: Conservation officer, nature (2 wheels), [employer] - single point, Wheelchair - manual ? ?Prior Device Use: Indicate devices/aids used by the patient prior to current illness, exacerbation or injury? None of the above ? ?Current Functional Level ?Cognition ? Overall Cognitive Status: No family/caregiver present to determine baseline cognitive functioning ?Current Attention Level: Selective ?Orientation Level: Oriented X4 ?Following Commands: Follows multi-step commands inconsistently ?Safety/Judgement: Decreased awareness of safety, Decreased awareness of deficits ?General Comments: Very motivated. At times, pt requires cuing/increased time to process and initiate ?   ?Extremity Assessment ?(includes Sensation/Coordination) ? Upper Extremity Assessment: RUE deficits/detail ?RUE Deficits / Details: Limited ROM with R shoulder horizontal abd/add/ER/IR, forearm pronation/supination, wrist and finger strength; poor coordination, ataxia  noted ?RUE Sensation: decreased light touch ?RUE Coordination: decreased fine motor, decreased gross motor  ?Lower Extremity Assessment: Defer to PT evaluation ?RLE Deficits / Details: pt reports baseline decreased R ankle DF; otherwise RLE strength 4-5/5; denies numbness/tingling ?RLE Sensation: history of peripheral neuropathy  ?  ?ADLs ? Overall ADL's : Needs assistance/impaired ?Eating/Feeding: Set up, Sitting ?Grooming: Set up, Sitting ?Upper Body Bathing: Minimal assistance, Sitting ?Lower Body Bathing: Moderate assistance, Sitting/lateral leans, Sit to/from stand ?Upper Body Dressing : Minimal assistance, Sitting ?Lower Body Dressing: Moderate assistance, Sitting/lateral leans, Sit to/from stand ?Toilet Transfer: Min guard, Minimal assistance, Ambulation ?Toileting- Clothing Manipulation and Hygiene: Minimal assistance, Sitting/lateral lean, Sit to/from stand ?Functional mobility during ADLs: Min guard, Minimal assistance ?General ADL Comments: Pt limited by ataxia noted on  the R side,as well as weakness and poor motor planning and balance.  ?  ?Mobility ? Overal bed mobility: Modified Independent ?General bed mobility comments: increased time and effort, reliant on momentum to

## 2022-04-14 NOTE — Plan of Care (Signed)
Pt is alert oriented x 4. Pt has right sided weakness, numbness and tingling to fingertips and right toes. Pt has been using urinal for urine output.  ? ? ?Problem: Education: ?Goal: Knowledge of General Education information will improve ?Description: Including pain rating scale, medication(s)/side effects and non-pharmacologic comfort measures ?Outcome: Progressing ?  ?Problem: Health Behavior/Discharge Planning: ?Goal: Ability to manage health-related needs will improve ?Outcome: Progressing ?  ?Problem: Clinical Measurements: ?Goal: Ability to maintain clinical measurements within normal limits will improve ?Outcome: Progressing ?Goal: Will remain free from infection ?Outcome: Progressing ?Goal: Diagnostic test results will improve ?Outcome: Progressing ?Goal: Respiratory complications will improve ?Outcome: Progressing ?Goal: Cardiovascular complication will be avoided ?Outcome: Progressing ?  ?Problem: Activity: ?Goal: Risk for activity intolerance will decrease ?Outcome: Progressing ?  ?Problem: Nutrition: ?Goal: Adequate nutrition will be maintained ?Outcome: Progressing ?  ?Problem: Coping: ?Goal: Level of anxiety will decrease ?Outcome: Progressing ?  ?Problem: Elimination: ?Goal: Will not experience complications related to bowel motility ?Outcome: Progressing ?Goal: Will not experience complications related to urinary retention ?Outcome: Progressing ?  ?Problem: Pain Managment: ?Goal: General experience of comfort will improve ?Outcome: Progressing ?  ?Problem: Safety: ?Goal: Ability to remain free from injury will improve ?Outcome: Progressing ?  ?Problem: Education: ?Goal: Knowledge of disease or condition will improve ?Outcome: Progressing ?Goal: Knowledge of secondary prevention will improve (SELECT ALL) ?Outcome: Progressing ?Goal: Knowledge of patient specific risk factors will improve (INDIVIDUALIZE FOR PATIENT) ?Outcome: Progressing ?Goal: Individualized Educational Video(s) ?Outcome:  Progressing ?  ?Problem: Skin Integrity: ?Goal: Risk for impaired skin integrity will decrease ?Outcome: Progressing ?  ?Problem: Coping: ?Goal: Will verbalize positive feelings about self ?Outcome: Progressing ?Goal: Will identify appropriate support needs ?Outcome: Progressing ?  ?Problem: Health Behavior/Discharge Planning: ?Goal: Ability to manage health-related needs will improve ?Outcome: Progressing ?  ?Problem: Self-Care: ?Goal: Ability to participate in self-care as condition permits will improve ?Outcome: Progressing ?Goal: Verbalization of feelings and concerns over difficulty with self-care will improve ?Outcome: Progressing ?Goal: Ability to communicate needs accurately will improve ?Outcome: Progressing ?  ?

## 2022-04-15 NOTE — Care Management Important Message (Signed)
Important Message ? ?Patient Details  ?Name: Ivan Lawson ?MRN: [id number] ?Date of Birth: May 15, 1956 ? ? ?Medicare Important Message Given:  Yes ? ? ? ? ?Anand Tejada ?04/15/2022, 3:56 PM ?

## 2022-04-15 NOTE — Progress Notes (Signed)
Physical Therapy Treatment ?Patient Details ?Name: Ivan Lawson ?MRN: [id number] ?DOB: 1956/09/07 ?Today's Date: 04/15/2022 ? ? ?History of Present Illness Pt is a 66 y.o. male recently d/c home 04/08/22 after admission for small acute ischemic stroke L paramedian pons, now admitted 04/12/22 with RLE weakness, diplopia, slurred speech. MRI shows increase in size of recent L pontine infarct, but otherwise no major changes. CTA with mid-basilar artery stenosis. Other PMH includes hyperthyroidism. ? ?  ?PT Comments  ? ? Pt has made good gains toward goals.  Emphasis on normalized transitions to EOB, sit to stands, balance challenge at EOB with BERG-type activity, progression of gait stability with challenge to balance.04/15/2022 ? ? ?   ?Recommendations for follow up therapy are one component of a multi-disciplinary discharge planning process, led by the attending physician.  Recommendations may be updated based on patient status, additional functional criteria and insurance authorization. ? ?Follow Up Recommendations ? Acute inpatient rehab (3hours/day) ?  ?  ?Assistance Recommended at Discharge Intermittent Supervision/Assistance  ?Patient can return home with the following A little help with walking and/or transfers;A lot of help with bathing/dressing/bathroom;Assistance with cooking/housework;Assist for transportation;Help with stairs or ramp for entrance ?  ?Equipment Recommendations ? None recommended by PT  ?  ?Recommendations for Other Services   ? ? ?  ?Precautions / Restrictions Precautions ?Precautions: Fall ?Precaution Comments: PT active in the community PTA  ?  ? ?Mobility ? Bed Mobility ?Overal bed mobility: Modified Independent ?  ?  ?  ?  ?  ?  ?General bed mobility comments: Increased time, R side lagging until pt was aware and then he self correct to normalize transition to sitting. ?  ? ?Transfers ?Overall transfer level: Needs assistance ?Equipment used: None ?Transfers: Sit to/from Stand ?Sit to  Stand: Min guard ?  ?  ?  ?  ?  ?General transfer comment: able to stand without compensation after initial need for stability.  Overall uncoordinated. ?  ? ?Ambulation/Gait ?Ambulation/Gait assistance: Min guard ?Gait Distance (Feet): 105 Feet (the additional 105 feet afterpropped standing rest for fatigue and worsening coordination.) ?Assistive device: None ?Gait Pattern/deviations: Step-through pattern ?  ?Gait velocity interpretation: 1.31 - 2.62 ft/sec, indicative of limited community ambulator ?  ?General Gait Details: unsteady overall, ataxic/uncoordinated R toe off and swing ttrough. drift and light stagger to the right worsened with progressively increased scanning both directions, abrupt directional changes and backing up. ? ? ?Stairs ?  ?  ?  ?  ?  ? ? ?Wheelchair Mobility ?  ? ?Modified Rankin (Stroke Patients Only) ?Modified Rankin (Stroke Patients Only) ?Modified Rankin: Moderately severe disability ? ? ?  ?Balance Overall balance assessment: Needs assistance ?  ?Sitting balance-Leahy Scale: Fair ?  ?  ?  ?Standing balance-Leahy Scale: Fair ?Standing balance comment: can accept mild challenge with deviation, but not LOB ?  ?  ?  ?  ?  ?  ?  ?  ?Standardized Balance Assessment ?Standardized Balance Assessment : Sharlene Motts Balance Test ?Sharlene Motts Balance Test ?Sit to Stand: Able to stand  independently using hands ?Standing Unsupported: Able to stand 30 seconds unsupported ?Sitting with Back Unsupported but Feet Supported on Floor or Stool: Able to sit safely and securely 2 minutes ?Stand to Sit: Controls descent by using hands ?From Standing, Reach Forward with Outstretched Arm: Can reach confidently >25 cm (10") ?From Standing Position, Pick up Object from Floor: Able to pick up shoe, needs supervision ?From Standing Position, Turn to Look Behind Over each Shoulder:  Looks behind from both sides and weight shifts well ?Turn 360 Degrees: Able to turn 360 degrees safely one side only in 4 seconds or less ?  ?  ? ?   ?Cognition Arousal/Alertness: Awake/alert ?Behavior During Therapy: [facility] for tasks assessed/performed ?Overall Cognitive Status: No family/caregiver present to determine baseline cognitive functioning (likely [facility]) ?  ?  ?  ?  ?  ?  ?  ?  ?  ?  ?  ?  ?  ?  ?  ?  ?General Comments: Very motivated. At times, pt requires cuing/increased time to process and initiate ?  ?  ? ?  ?Exercises   ? ?  ?General Comments General comments (skin integrity, edema, etc.): reinforced importance of discovering his risk factors, BE FAST ?  ?  ? ?Pertinent Vitals/Pain Pain Assessment ?Pain Assessment: Faces ?Faces Pain Scale: No hurt ?Pain Intervention(s): Monitored during session  ? ? ?Home Living   ?  ?  ?  ?  ?  ?  ?  ?  ?  ?   ?  ?Prior Function    ?  ?  ?   ? ?PT Goals (current goals can now be found in the care plan section) Acute Rehab PT Goals ?Patient Stated Goal: willing to do whatever he needs to do to get well ?PT Goal Formulation: With patient ?Time For Goal Achievement: 04/27/22 ?Potential to Achieve Goals: Good ?Progress towards PT goals: Progressing toward goals ? ?  ?Frequency ? ? ? Min 3X/week ? ? ? ?  ?PT Plan Current plan remains appropriate  ? ? ?Co-evaluation   ?  ?  ?  ?  ? ?  ?AM-PAC PT "6 Clicks" Mobility   ?Outcome Measure ? Help needed turning from your back to your side while in a flat bed without using bedrails?: A Little ?Help needed moving from lying on your back to sitting on the side of a flat bed without using bedrails?: A Little ?Help needed moving to and from a bed to a chair (including a wheelchair)?: A Little ?Help needed standing up from a chair using your arms (e.g., wheelchair or bedside chair)?: A Little ?Help needed to walk in hospital room?: A Little ?Help needed climbing 3-5 steps with a railing? : A Lot ?6 Click Score: 17 ? ?  ?End of Session   ?Activity Tolerance: Patient tolerated treatment well ?Patient left: in chair;with call bell/phone within reach;with chair alarm set ?Nurse  Communication: Mobility status ?PT Visit Diagnosis: Other abnormalities of gait and mobility (R26.89);Other symptoms and signs involving the nervous system (R29.898) ?  ? ? ?Time: 0277-4128 ?PT Time Calculation (min) (ACUTE ONLY): 44 min ? ?Charges:  $Gait Training: 8-22 mins ?$Therapeutic Activity: 8-22 mins ?$Self Care/Home Management: 8-22          ?          ? ?04/15/2022 ? ?Jacinto Halim., PT ?Acute Rehabilitation Services ?[phone]  (pager) ?[phone]  (office) ? ? ?Eliseo Gum Jayle Solarz ?04/15/2022, 11:42 AM ? ?

## 2022-04-15 NOTE — Progress Notes (Signed)
? ?  PROGRESS NOTE ? ? ? ?Ivan Lawson  ZDG:[id number] DOB: 1956-02-06 DOA: 04/12/2022 ?PCP: Blair Heys, MD ? ? ?Brief Narrative: ?Ivan Lawson is a 66 y.o. male with a history of hypertension, Grave's disease now with hypothyroidism, CKD stage IIIa, prediabetes who presented secondary to right sided weakness and worsening dysarthria. MRI confirmed evolution of previously diagnosed stroke. ? ? ?Assessment and Plan: ? ?Acute CVA ?Basilar artery stenosis ?MRI confirms worsening of previously diagnosed left paramedian pontine infarct. CTA head and neck significant for stenosis of mid basilar, left proximal PCA and right A1 origin. LDL of 101. Hemoglobin A1C of 6.0%. Repeat Transthoracic Echocardiogram not obtained. Neurology recommendations for Aspirin/Brilinta for one (1) month, followed by Aspirin/Plavix for two (2) months followed by aspirin monotherapy. PT/OT recommendations for inpatient rehabilitation. ? ?Acquired hypothyroidism ?TSH checked on admission and is 1.411. Patient is on Synthroid 137 mcg daily as an outpatient ?-Continue Synthroid 137 mcg daily ? ?Primary hypertension ?Patient is on amlodipine as an outpatient. Held on admission. Currently normotensive. ?-Continue to hold amlodipine ? ?CKD stage IIIa ?Stable. ? ?History of sinus bradycardia ?Noted. ? ? ?DVT prophylaxis: Lovenox ?Code Status:   Code Status: Full Code ?Family Communication: None at bedside ?Disposition Plan: Discharge to Inpatient Rehabilitation once bed is available ? ? ?Consultants:  ?Neurology/Stroke ? ?Procedures:  ?None ? ?Antimicrobials: ?None  ? ? ?Subjective: ?No issues noted overnight. ? ?Objective: ?BP (!) 148/79 (BP Location: Left Arm)   Pulse 60   Temp 97.8 ?F (36.6 ?C) (Oral)   Resp 14   Wt 95.3 kg   SpO2 100%   BMI 29.29 kg/m?  ? ?Examination: ? ?General exam: Appears calm and comfortable  ? ?Data Reviewed: I have personally reviewed following labs and imaging studies ? ?CBC ?Lab Results  ?Component Value Date   ? WBC 8.7 04/13/2022  ? RBC 5.02 04/13/2022  ? HGB 14.8 04/13/2022  ? HCT 43.2 04/13/2022  ? MCV 86.1 04/13/2022  ? MCH 29.5 04/13/2022  ? PLT 191 04/13/2022  ? MCHC 34.3 04/13/2022  ? RDW 12.8 04/13/2022  ? LYMPHSABS 2.9 04/12/2022  ? MONOABS 0.6 04/12/2022  ? EOSABS 0.1 04/12/2022  ? BASOSABS 0.0 04/12/2022  ? ? ? ?Last metabolic panel ?Lab Results  ?Component Value Date  ? NA 136 04/13/2022  ? K 3.7 04/13/2022  ? CL 110 04/13/2022  ? CO2 19 (L) 04/13/2022  ? BUN 15 04/13/2022  ? CREATININE 1.26 (H) 04/13/2022  ? GLUCOSE 93 04/13/2022  ? GFRNONAA >60 04/13/2022  ? CALCIUM 8.4 (L) 04/13/2022  ? PROT 7.3 04/12/2022  ? ALBUMIN 4.2 04/12/2022  ? BILITOT 1.7 (H) 04/12/2022  ? ALKPHOS 58 04/12/2022  ? AST 25 04/12/2022  ? ALT 22 04/12/2022  ? ANIONGAP 7 04/13/2022  ? ? ?GFR: ?CrCl cannot be calculated (Unknown ideal weight.). ? ?No results found for this or any previous visit (from the past 240 hour(s)).  ? ? ?Radiology Studies: ?No results found. ? ? ? LOS: 3 days  ? ? ?Jacquelin Hawking, MD ?[facility] ?04/15/2022, 11:39 AM ? ? ?If 7PM-7AM, please contact night-coverage ?[email or web address] ? ?

## 2022-04-16 ENCOUNTER — Inpatient Hospital Stay (HOSPITAL_COMMUNITY)
Admission: RE | Admit: 2022-04-16 | Discharge: 2022-04-25 | DRG: 057 | Disposition: A | Discharge status: Home / Self Care | Payer: PPO | Source: Intra-hospital | Attending: Physical Medicine & Rehabilitation | Admitting: Physical Medicine & Rehabilitation

## 2022-04-16 DIAGNOSIS — I10 Essential (primary) hypertension: Secondary | ICD-10-CM

## 2022-04-16 DIAGNOSIS — I69351 Hemiplegia and hemiparesis following cerebral infarction affecting right dominant side: Secondary | ICD-10-CM | POA: Diagnosis not present

## 2022-04-16 DIAGNOSIS — E785 Hyperlipidemia, unspecified: Secondary | ICD-10-CM | POA: Diagnosis present

## 2022-04-16 DIAGNOSIS — K5900 Constipation, unspecified: Secondary | ICD-10-CM | POA: Diagnosis not present

## 2022-04-16 DIAGNOSIS — N1831 Chronic kidney disease, stage 3a: Secondary | ICD-10-CM | POA: Diagnosis present

## 2022-04-16 DIAGNOSIS — Z7902 Long term (current) use of antithrombotics/antiplatelets: Secondary | ICD-10-CM | POA: Diagnosis not present

## 2022-04-16 DIAGNOSIS — Z79899 Other long term (current) drug therapy: Secondary | ICD-10-CM

## 2022-04-16 DIAGNOSIS — E538 Deficiency of other specified B group vitamins: Secondary | ICD-10-CM | POA: Diagnosis present

## 2022-04-16 DIAGNOSIS — E0500 Thyrotoxicosis with diffuse goiter without thyrotoxic crisis or storm: Secondary | ICD-10-CM | POA: Diagnosis not present

## 2022-04-16 DIAGNOSIS — R5381 Other malaise: Secondary | ICD-10-CM | POA: Diagnosis present

## 2022-04-16 DIAGNOSIS — E039 Hypothyroidism, unspecified: Secondary | ICD-10-CM | POA: Diagnosis not present

## 2022-04-16 DIAGNOSIS — Z79890 Hormone replacement therapy (postmenopausal): Secondary | ICD-10-CM

## 2022-04-16 DIAGNOSIS — I129 Hypertensive chronic kidney disease with stage 1 through stage 4 chronic kidney disease, or unspecified chronic kidney disease: Secondary | ICD-10-CM | POA: Diagnosis present

## 2022-04-16 DIAGNOSIS — R7303 Prediabetes: Secondary | ICD-10-CM | POA: Diagnosis not present

## 2022-04-16 DIAGNOSIS — I69322 Dysarthria following cerebral infarction: Secondary | ICD-10-CM

## 2022-04-16 DIAGNOSIS — I639 Cerebral infarction, unspecified: Secondary | ICD-10-CM | POA: Diagnosis present

## 2022-04-16 MED ORDER — ACETAMINOPHEN 325 MG PO TABS: 325.0000 mg | ORAL_TABLET | ORAL | Status: DC | PRN

## 2022-04-16 MED ORDER — CLOPIDOGREL BISULFATE 75 MG PO TABS: 75.0000 mg | ORAL_TABLET | Freq: Every day | ORAL | Status: DC

## 2022-04-16 MED ORDER — TICAGRELOR 90 MG PO TABS: 90.0000 mg | ORAL_TABLET | Freq: Two times a day (BID) | ORAL | Status: DC

## 2022-04-16 MED ORDER — DIPHENHYDRAMINE HCL 12.5 MG/5ML PO ELIX: 12.5000 mg | ORAL_SOLUTION | Freq: Four times a day (QID) | ORAL | Status: DC | PRN

## 2022-04-16 MED ORDER — ALUM & MAG HYDROXIDE-SIMETH 200-200-20 MG/5ML PO SUSP: 30.0000 mL | ORAL | Status: DC | PRN

## 2022-04-16 MED ORDER — ASPIRIN 81 MG PO TBEC
81.0000 mg | DELAYED_RELEASE_TABLET | Freq: Every day | ORAL | Status: DC
  Administered 2022-04-17 – 2022-04-25 (×9): 81 mg via ORAL
  Filled 2022-04-16 (×9): qty 1

## 2022-04-16 MED ORDER — FLEET ENEMA 7-19 GM/118ML RE ENEM: 1.0000 | ENEMA | Freq: Once | RECTAL | Status: DC | PRN

## 2022-04-16 MED ORDER — METHOCARBAMOL 500 MG PO TABS
500.0000 mg | ORAL_TABLET | Freq: Four times a day (QID) | ORAL | Status: DC | PRN
Start: 2022-04-16 — End: 2022-04-25
  Administered 2022-04-17 – 2022-04-24 (×8): 500 mg via ORAL
  Filled 2022-04-16 (×8): qty 1

## 2022-04-16 MED ORDER — ENOXAPARIN SODIUM 40 MG/0.4ML IJ SOSY: 40.0000 mg | PREFILLED_SYRINGE | INTRAMUSCULAR | Status: DC

## 2022-04-16 MED ORDER — SENNOSIDES-DOCUSATE SODIUM 8.6-50 MG PO TABS: 1.0000 | ORAL_TABLET | Freq: Every evening | ORAL | Status: DC | PRN

## 2022-04-16 MED ORDER — PROCHLORPERAZINE 25 MG RE SUPP: 12.5000 mg | Freq: Four times a day (QID) | RECTAL | Status: DC | PRN

## 2022-04-16 MED ORDER — AMLODIPINE BESYLATE 2.5 MG PO TABS: 2.5000 mg | ORAL_TABLET | Freq: Every day | ORAL | Status: DC

## 2022-04-16 MED ORDER — ENOXAPARIN SODIUM 40 MG/0.4ML IJ SOSY
40.0000 mg | PREFILLED_SYRINGE | INTRAMUSCULAR | Status: DC
Start: 2022-04-16 — End: 2022-04-23
  Administered 2022-04-16 – 2022-04-22 (×7): 40 mg via SUBCUTANEOUS
  Filled 2022-04-16 (×7): qty 0.4

## 2022-04-16 MED ORDER — SORBITOL 70 % SOLN: 30.0000 mL | Freq: Every day | Status: DC | PRN

## 2022-04-16 MED ORDER — VITAMIN B-12 1000 MCG PO TABS
1000.0000 ug | ORAL_TABLET | Freq: Every day | ORAL | Status: DC
  Administered 2022-04-17 – 2022-04-25 (×9): 1000 ug via ORAL
  Filled 2022-04-16 (×9): qty 1

## 2022-04-16 MED ORDER — TICAGRELOR 90 MG PO TABS
90.0000 mg | ORAL_TABLET | Freq: Two times a day (BID) | ORAL | Status: DC
  Administered 2022-04-16 – 2022-04-25 (×18): 90 mg via ORAL
  Filled 2022-04-16 (×18): qty 1

## 2022-04-16 MED ORDER — TRAZODONE HCL 50 MG PO TABS: 25.0000 mg | ORAL_TABLET | Freq: Every evening | ORAL | Status: DC | PRN

## 2022-04-16 MED ORDER — PROCHLORPERAZINE MALEATE 5 MG PO TABS: 5.0000 mg | ORAL_TABLET | Freq: Four times a day (QID) | ORAL | Status: DC | PRN

## 2022-04-16 MED ORDER — GUAIFENESIN-DM 100-10 MG/5ML PO SYRP: 5.0000 mL | ORAL_SOLUTION | Freq: Four times a day (QID) | ORAL | Status: DC | PRN

## 2022-04-16 MED ORDER — LEVOTHYROXINE SODIUM 25 MCG PO TABS
137.0000 ug | ORAL_TABLET | Freq: Every day | ORAL | Status: DC
  Administered 2022-04-17 – 2022-04-25 (×9): 137 ug via ORAL
  Filled 2022-04-16 (×9): qty 1

## 2022-04-16 MED ORDER — PROCHLORPERAZINE EDISYLATE 10 MG/2ML IJ SOLN: 5.0000 mg | Freq: Four times a day (QID) | INTRAMUSCULAR | Status: DC | PRN

## 2022-04-16 MED ORDER — ROSUVASTATIN CALCIUM 20 MG PO TABS
20.0000 mg | ORAL_TABLET | Freq: Every day | ORAL | Status: DC
  Administered 2022-04-17 – 2022-04-25 (×9): 20 mg via ORAL
  Filled 2022-04-16 (×9): qty 1

## 2022-04-16 NOTE — Discharge Summary (Signed)
Physician Discharge Summary  Patient ID: Ivan Lawson MRN: [id number] DOB/AGE: 03-21-1956 66 y.o.  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 Exam Location:  Inpatient Procedure: 2D Echo, Cardiac Doppler, Color Doppler and Intracardiac            Opacification Agent Indications:    Stroke  History:        Patient has no prior history of Echocardiogram examinations.                 Abnormal ECG, Stroke, Arrythmias:Bradycardia; Risk                 Factors:Hypertension.  Sonographer:    Sheralyn Boatmanina West RDCS Referring Phys: [id number] Angie FavaJUSTIN B HOWERTER  Sonographer Comments: Technically difficult study due to poor echo windows, suboptimal apical window and suboptimal subcostal window. Patient had trouble following directions. Study delayed 20 mins to bathroom patient. IMPRESSIONS  1. Left ventricular ejection fraction, by estimation, is 60 to 65%. The left ventricle has normal function. The left ventricle has no regional wall motion abnormalities. Left ventricular diastolic parameters were normal.  2. Right ventricular systolic function is normal. The right ventricular size is normal. Tricuspid regurgitation signal is  inadequate for assessing PA pressure.  3. The mitral valve is grossly normal. Trivial mitral valve regurgitation.  4. The aortic valve is tricuspid. Aortic valve regurgitation is not visualized.  5. The inferior vena cava is normal in size with greater than 50% respiratory variability, suggesting right atrial pressure of 3 mmHg. Comparison(s): No prior Echocardiogram. FINDINGS  Left Ventricle: Left ventricular ejection fraction, by estimation, is 60 to 65%. The left ventricle has normal function. The left ventricle has no regional wall motion abnormalities. Definity contrast agent was given IV to delineate the left ventricular  endocardial borders. The left ventricular internal cavity size was normal in size. There is borderline left ventricular hypertrophy. Left ventricular diastolic parameters were normal. Right Ventricle: The right ventricular size is normal. No increase in right ventricular wall thickness. Right ventricular systolic function is normal. Tricuspid regurgitation signal is inadequate for assessing PA pressure. Left Atrium: Left atrial size was normal in size. Right Atrium: Right atrial size was normal in size. Pericardium: There is no evidence of pericardial effusion. Mitral Valve: The mitral valve is grossly normal. Trivial mitral valve regurgitation. Tricuspid Valve: The tricuspid valve is grossly normal. Tricuspid valve regurgitation is  trivial. Aortic Valve: The aortic valve is tricuspid. Aortic valve regurgitation is not visualized. Pulmonic Valve: The pulmonic valve was grossly normal. Pulmonic valve regurgitation is trivial. Aorta: The aortic root is normal in size and structure. Venous: The inferior vena cava is normal in size with greater than 50% respiratory variability, suggesting right atrial pressure of 3 mmHg. IAS/Shunts: No atrial level shunt detected by color flow Doppler.  LEFT VENTRICLE PLAX 2D LVIDd:         4.70 cm     Diastology LVIDs:         3.00 cm     LV e' medial:    5.55  cm/s LV PW:         1.00 cm     LV E/e' medial:  9.8 LV IVS:        1.00 cm     LV e' lateral:   10.20 cm/s LVOT diam:     2.20 cm     LV E/e' lateral: 5.3 LV SV:         113 LV SV Index:   51 LVOT Area:     3.80 cm  LV Volumes (MOD) LV vol d, MOD A2C: 82.1 ml LV vol d, MOD A4C: 86.8 ml LV vol s, MOD A2C: 28.2 ml LV vol s, MOD A4C: 36.7 ml LV SV MOD A2C:     53.9 ml LV SV MOD A4C:     86.8 ml LV SV MOD BP:      55.3 ml RIGHT VENTRICLE             IVC RV S prime:     12.90 cm/s  IVC diam: 1.60 cm TAPSE (M-mode): 3.0 cm LEFT ATRIUM             Index        RIGHT ATRIUM           Index LA diam:        4.10 cm 1.84 cm/m   RA Area:     15.40 cm LA Vol (A2C):   43.6 ml 19.59 ml/m  RA Volume:   38.30 ml  17.21 ml/m LA Vol (A4C):   27.9 ml 12.54 ml/m LA Biplane Vol: 35.4 ml 15.91 ml/m  AORTIC VALVE LVOT Vmax:   125.00 cm/s LVOT Vmean:  79.700 cm/s LVOT VTI:    0.296 m  AORTA Ao Root diam: 3.30 cm Ao Asc diam:  3.30 cm MITRAL VALVE MV Area (PHT): 3.48 cm    SHUNTS MV Decel Time: 218 msec    Systemic VTI:  0.30 m MV E velocity: 54.50 cm/s  Systemic Diam: 2.20 cm MV A velocity: 57.20 cm/s MV E/A ratio:  0.95 Nona Dell MD Electronically signed by Nona Dell MD Signature Date/Time: 04/07/2022/10:25:39 AM    Final    CT HEAD CODE STROKE WO CONTRAST  Result Date: 04/12/2022 CLINICAL DATA:  Code stroke.  Right-sided deficit EXAM: CT HEAD WITHOUT CONTRAST TECHNIQUE: Contiguous axial images were obtained from the base of the skull through the vertex without intravenous contrast. RADIATION DOSE REDUCTION: This exam was performed according to the departmental dose-optimization program which includes automated exposure control, adjustment of the mA and/or kV according to patient size and/or use of iterative reconstruction technique. COMPARISON:  Six days ago FINDINGS: Brain: Recent infarct in the left para median pons by prior MRI. Chronic small vessel ischemia in the hemispheric white matter by prior MRI,  accounting for the patchy left parietal subcortical low densities  which are asymmetric. Is also accounts for a chronic lacune at the right corona radiata no hemorrhage, obstructive hydrocephalus, or collection. Central predominant cerebral volume loss. Vascular: No asymmetric vessel hyperdensity Skull: Negative Sinuses/Orbits: Dysconjugate gaze, nonspecific. Other: These results were communicated to Dr. Derry Lory at 5:57 am on 04/12/2022 by text page via the [facility] messaging system. ASPECTS [facility] Stroke Program Early CT Score) - Ganglionic level infarction (caudate, lentiform nuclei, internal capsule, insula, M1-M3 cortex): 7 - Supraganglionic infarction (M4-M6 cortex): 3 Total score (0-10 with 10 being normal): 10 IMPRESSION: 1. Known recent left pontine infarct by prior MRI. No detectable change. 2. Chronic white matter ischemia. Electronically Signed   By: Tiburcio Pea M.D.   On: 04/12/2022 05:59   CT ANGIO HEAD NECK W WO CM (CODE STROKE)  Result Date: 04/12/2022 CLINICAL DATA:  Neuro deficit with acute stroke suspected. EXAM: CT ANGIOGRAPHY HEAD AND NECK TECHNIQUE: Multidetector CT imaging of the head and neck was performed using the standard protocol during bolus administration of intravenous contrast. Multiplanar CT image reconstructions and MIPs were obtained to evaluate the vascular anatomy. Carotid stenosis measurements (when applicable) are obtained utilizing NASCET criteria, using the distal internal carotid diameter as the denominator. RADIATION DOSE REDUCTION: This exam was performed according to the departmental dose-optimization program which includes automated exposure control, adjustment of the mA and/or kV according to patient size and/or use of iterative reconstruction technique. CONTRAST:  83mL OMNIPAQUE IOHEXOL 350 MG/ML SOLN COMPARISON:  CTA and brain MRI from 6 days ago. FINDINGS: CTA NECK FINDINGS Aortic arch: Unremarkable Right carotid system: Small mild plaque at the bifurcation  without stenosis or ulceration Left carotid system: Predominately calcified plaque at the ICA bulb. No stenosis or ulceration. Vertebral arteries: No proximal subclavian stenosis. Dominant right vertebral artery. The vertebral arteries are smoothly contoured and diffusely patent Skeleton: None Other neck: Negative Upper chest: Negative Review of the MIP images confirms the above findings CTA HEAD FINDINGS Anterior circulation: Atheromatous calcification along the carotid siphons. No branch occlusion, beading, or aneurysm. High-grade right A1 origin Stenosis. Posterior circulation: Dominant right vertebral artery. Fetal type bilateral PCA. At least moderate narrowing at the left P2 segment. Moderate mid basilar stenosis. No emergent branch occlusion, beading, or aneurysm. Venous sinuses: Patent Anatomic variants: As above Review of the MIP images confirms the above findings IMPRESSION: 1. No emergent finding. 2. Intracranial atherosclerosis with most notable stenoses affecting the mid basilar, left proximal PCA, and right A1 origin. 3. No flow limiting stenosis or embolic source seen in the neck. Electronically Signed   By: Tiburcio Pea M.D.   On: 04/12/2022 06:05    Labs:  Basic Metabolic Panel: Recent Labs  Lab 04/23/22 0521 04/24/22 0541  NA 139 138  K 5.2* 4.4  CL 105 106  CO2 27 23  GLUCOSE 91 93  BUN 18 20  CREATININE 1.47* 1.40*  CALCIUM 9.4 9.2    CBC: Recent Labs  Lab 04/23/22 0521  WBC 6.6  HGB 15.0  HCT 44.3  MCV 87.5  PLT 225    CBG: No results for input(s): GLUCAP in the last 168 hours.   Brief HPI:   Ivan Lawson is a 66 y.o. male who presented to the ED with numbness and tingling in right toes and fingers. This progressed with paresis of left upper extremity. MRI revealed left pontine stroke.   Hospital Course: Ivan Lawson was admitted to rehab 04/16/2022 for inpatient therapies to consist of PT, ST and OT at least three  hours five days a week. Past  admission physiatrist, therapy team and rehab RN have worked together to provide customized collaborative inpatient rehab. RUE strength and dysarthria improving daily since admission per patient. Constipation bothersome and was administered Senekot and sorbitol as needed. Lovenox discontinued 2/23 as patient is ambulatory. BUN and creatinine lower and potassium normal on 5/24. Pai and swelling lf right index finger treated with Voltaren gel. Must avoid NSAIDs. Received triamcinolone IM injection prior to discharge on 04/25/2022.  Follow-up serum uric acid results and finger inflammatory process with Dr. Wynn Banker and primary care provider.  Blood pressures were monitored on TID basis and remained controlled and amlodipine 2.5 mg daily was resumed on 5/18. Home hydralazine held.    Rehab course: During patient's stay in rehab weekly team conferences were held to monitor patient's progress, set goals and discuss barriers to discharge. At admission, patient required min assist with basic self care skills, min assist/supervision/CGA for transfers, bed mobility and locomotion.  He has had improvement in activity tolerance, balance, postural control as well as ability to compensate for deficits. He has had improvement in functional use RUE/LUE  and RLE/LLE as well as improvement in awareness  Disposition: Home Discharge disposition: 01-Home or Self Care      Diet: Heart healthy/carb modified  Special Instructions: Brilinta 90 mg twice daily plus aspirin 81 mg daily until 05/12/2022 Then Plavix 75 mg daily plus aspirin 81 mg daily for 2 months Then aspirin 81 mg daily only   Refrain from use of all NSAIDs (ibuproven, Motrin, Aleve, etc).  No driving, alcohol consumption or tobacco use.   Discharge Instructions     Ambulatory referral to Physical Medicine Rehab   Complete by: As directed    Hospital follow-up in two weeks per Dr. Wynn Banker   Discharge patient   Complete by: As directed     Discharge disposition: 01-Home or Self Care   Discharge patient date: 04/25/2022      Allergies as of 04/25/2022       Reactions   Benadryl [diphenhydramine] Anxiety        Medication List     TAKE these medications    acetaminophen 325 MG tablet Commonly known as: TYLENOL Take 1-2 tablets (325-650 mg total) by mouth every 4 (four) hours as needed for mild pain.   amLODipine 2.5 MG tablet Commonly known as: NORVASC Take 1 tablet (2.5 mg total) by mouth daily.   Aspirin Low Dose 81 MG tablet Generic drug: aspirin EC Take 1 tablet (81 mg total) by mouth daily. Swallow whole.   clopidogrel 75 MG tablet Commonly known as: PLAVIX Take 1 tablet (75 mg total) by mouth daily. Starting on 6/11, take for 60 days (2 months) Start taking on: May 12, 2022 What changed: These instructions start on May 12, 2022. If you are unsure what to do until then, ask your doctor or other care provider.   diclofenac Sodium 1 % Gel Commonly known as: VOLTAREN Apply 2 g topically 4 (four) times daily.   levothyroxine 137 MCG tablet Commonly known as: SYNTHROID Take 137 mcg by mouth every morning.   methocarbamol 500 MG tablet Commonly known as: ROBAXIN Take 1 tablet (500 mg total) by mouth every 6 (six) hours as needed for muscle spasms.   rosuvastatin 20 MG tablet Commonly known as: CRESTOR Take 1 tablet (20 mg total) by mouth daily.   ticagrelor 90 MG Tabs tablet Commonly known as: BRILINTA Take 1 tablet (90 mg total) by mouth 2 (  two) times daily for 16 days.   vitamin B-12 1000 MCG tablet Commonly known as: CYANOCOBALAMIN Take 1,000 mcg by mouth daily.        Follow-up Information     Blair Heys, MD. Call.   Specialty: Family Medicine Why: Call tomorrow to make arrangements for hospital follow-up appointment Contact information: [address] [place] [address] [phone]         [facility]. Go to.   Why:  05/13/2022 Contact information: [address]     [address] [place] [address] [phone]        Erick Colace, MD Follow up.   Specialty: Physical Medicine and Rehabilitation Why: office will call you to arrange your appt (sent) Contact information: [address] [place] [address] [phone]         Victorino Sparrow, MD. Go to.   Specialty: Vascular Surgery Why: 05/10/2022 Contact information: [address] [place] [address] [phone]         Jodelle Red, MD. Call.   Specialty: Cardiology Why: Call manufacturer regarding delivery of heart monitor and call office to ??reschedule Contact information: [address] [place] [address] [phone]                 Signed: Milinda Antis 04/25/2022, 9:33 AM

## 2022-04-16 NOTE — H&P (Incomplete)
? ? ?Physical Medicine and Rehabilitation Admission H&P ? ?  ? ?CC: Deficits secondary to left paramedian pontine stroke on MRI ? ?HPI: Ivan Lawson is a 66 year old male who presented to the emergency department on 04/06/2022 complaining of new onset numbness to the fingers of his right hand as well as the toes of his right foot.  Symptoms progressed to include tingling of his right arm and numbness of his right lower leg below his knee.  He also reported unsteady gait.  On arrival to the emergency department his speech was clear his face was symmetric and there was no arm drift.  Neurology, Dr. Otelia LimesLindzen, was consulted.  CT of the head revealed a 9 mm low-density in the corona radiata on the right side.  Most likely etiology for his presentation was a subacute posterior fossa lacunar infarct versus a left thalamic lacunar infarct.  MRI revealed left pontine stroke.  His CTA was negative for thrombus.  There was less than 50% bilateral carotid artery stenosis.  Dual antiplatelet therapy.  Currently receiving Brilinta 90 mg daily with aspirin 81 mg daily for 1 month.  Then will switch to Plavix and aspirin for 2 months then aspirin alone. Crestor was added for hyperlipidemia.  He will need 30-day Holter monitor upon discharge. The patient requires inpatient medicine and rehabilitation evaluations and services for ongoing dysfunction secondary to pontine stroke. ? ?Past medical history significant for Graves' disease status post radioactive iodine ablation.  History of primary hypertension, chronic kidney disease stage IIIa, B12 deficiency and sinus bradycardia. ? ?Review of Systems  ?Constitutional:  Negative for chills and fever.  ?HENT:  Negative for hearing loss and sore throat.   ?Eyes:  Negative for blurred vision and double vision.  ?Respiratory:  Negative for cough and sputum production.   ?Cardiovascular:  Negative for chest pain and palpitations.  ?Gastrointestinal:  Negative for abdominal pain, constipation,  nausea and vomiting.  ?Genitourinary:  Positive for urgency. Negative for dysuria.  ?Musculoskeletal:  Negative for back pain and neck pain.  ?Neurological:  Negative for dizziness and headaches.  ?Psychiatric/Behavioral:  Negative for depression and suicidal ideas.   ?Past Medical History:  ?Diagnosis Date  ? Hyperthyroidism   ? ?No past surgical history on file. ?Family History  ?Problem Relation Age of Onset  ? Thyroid disease Neg Hx   ? ?Social History:  reports that he has never smoked. He has never used smokeless tobacco. He reports that he does not currently use alcohol. He reports that he does not currently use drugs. ?Allergies: No Known Allergies ?Medications Prior to Admission  ?Medication Sig Dispense Refill  ? aspirin 81 MG EC tablet Take 1 tablet (81 mg total) by mouth daily. Swallow whole. 30 tablet 11  ? hydrALAZINE (APRESOLINE) 50 MG tablet Take 1 tablet (50 mg total) by mouth 3 (three) times daily. 90 tablet 0  ? levothyroxine (SYNTHROID) 137 MCG tablet Take 137 mcg by mouth every morning.    ? naproxen sodium (ALEVE) 220 MG tablet Take 440 mg by mouth daily as needed (pain).    ? rosuvastatin (CRESTOR) 20 MG tablet Take 1 tablet (20 mg total) by mouth daily. 30 tablet 0  ? vitamin B-12 (CYANOCOBALAMIN) 1000 MCG tablet Take 1,000 mcg by mouth daily.    ? [DISCONTINUED] amLODipine (NORVASC) 10 MG tablet Take 1 tablet (10 mg total) by mouth daily.  3  ? [DISCONTINUED] clopidogrel (PLAVIX) 75 MG tablet Take 1 tablet (75 mg total) by mouth daily. 20 tablet  0  ? ? ? ? ?Home: ?Home Living ?Family/patient expects to be discharged to:: Private residence ?Living Arrangements: Alone ?Available Help at Discharge: Family, Available 24 hours/day ?Type of Home: House ?Home Access: Stairs to enter ?Entrance Stairs-Number of Steps: 6 ?Entrance Stairs-Rails: Right, Left ?Home Layout: One level ?Bathroom Shower/Tub: Tub/shower unit, Walk-in shower ?Bathroom Toilet: Standard ?Bathroom Accessibility: Yes ?Home  Equipment: Agricultural consultant (2 wheels), [employer] - single point, Wheelchair - manual ?Additional Comments: Brother can provide support ? Lives With: Alone ?  ?Functional History: ?Prior Function ?Prior Level of Function : Independent/Modified Independent, Driving ?Mobility Comments: Retired, indep without DME; brother has been providing support since recent d/c home after stroke ? ?Functional Status:  ?Mobility: ?Bed Mobility ?Overal bed mobility: Modified Independent ?General bed mobility comments: Increased time, R side lagging until pt was aware and then he self correct to normalize transition to sitting. ?Transfers ?Overall transfer level: Needs assistance ?Equipment used: None ?Transfers: Sit to/from Stand ?Sit to Stand: Min guard ?General transfer comment: cues for safety. ?Ambulation/Gait ?Ambulation/Gait assistance: Min assist ?Gait Distance (Feet): 105 Feet (then  100 x2 approx.  with standing propped rest to regroup from muscle fatigue. and degradation of gait pattern.) ?Assistive device: None ?Gait Pattern/deviations: Step-through pattern ?General Gait Details: unsteady overall, uncoordinated R toe off and flat footed contact.  weak hip forward translation, notable drift R and stagger R if scanning L.  Consistent deviation with change of directions, scanning and backing up. ?Gait velocity interpretation: <1.31 ft/sec, indicative of household ambulator ?  ? ?ADL: ?ADL ?Overall ADL's : Needs assistance/impaired ?Eating/Feeding: Set up, Sitting ?Grooming: Set up, Sitting ?Upper Body Bathing: Minimal assistance, Sitting ?Lower Body Bathing: Moderate assistance, Sitting/lateral leans, Sit to/from stand ?Upper Body Dressing : Minimal assistance, Sitting ?Lower Body Dressing: Moderate assistance, Sitting/lateral leans, Sit to/from stand ?Toilet Transfer: Min guard, Minimal assistance, Ambulation ?Toileting- Clothing Manipulation and Hygiene: Minimal assistance, Sitting/lateral lean, Sit to/from stand ?Functional  mobility during ADLs: Min guard, Minimal assistance ?General ADL Comments: Pt limited by ataxia noted on the R side,as well as weakness and poor motor planning and balance. ? ?Cognition: ?Cognition ?Overall Cognitive Status: Within Functional Limits for tasks assessed ?Orientation Level: Oriented X4 ?Cognition ?Arousal/Alertness: Awake/alert ?Behavior During Therapy: [facility] for tasks assessed/performed ?Overall Cognitive Status: Within Functional Limits for tasks assessed ?Area of Impairment: Attention, Following commands, Safety/judgement, Awareness, Problem solving ?Current Attention Level: Selective ?Following Commands: Follows multi-step commands inconsistently ?Safety/Judgement: Decreased awareness of safety, Decreased awareness of deficits ?Awareness: Emergent ?Problem Solving: Requires verbal cues ?General Comments: Very motivated. At times, pt requires cuing/increased time to process and initiate ? ?Physical Exam: ?Blood pressure (!) 158/88, pulse 61, temperature 98.2 ?F (36.8 ?C), temperature source Oral, resp. rate 20, weight 95.3 kg, SpO2 100 %. ?Physical Exam ?Constitutional:   ?   General: He is not in acute distress. ?HENT:  ?   Head: Normocephalic and atraumatic.  ?Eyes:  ?   Extraocular Movements: Extraocular movements intact.  ?   Pupils: Pupils are equal, round, and reactive to light.  ?Cardiovascular:  ?   Rate and Rhythm: Normal rate and regular rhythm.  ?Pulmonary:  ?   Effort: Pulmonary effort is normal.  ?Abdominal:  ?   General: Abdomen is flat.  ?   Palpations: Abdomen is soft.  ?Musculoskeletal:     ?   General: No swelling.  ?Skin: ?   General: Skin is warm and dry.  ?Neurological:  ?   Mental Status: He is alert.  ?  Comments: Asks [place] to speak somewhat slow because of delayed processing   ?Psychiatric:     ?   Mood and Affect: Mood normal.     ?   Behavior: Behavior normal.  ? ? ?No results found for this or any previous visit (from the past 48 hour(s)). ?No results found. ? ? ? ?Blood  pressure (!) 158/88, pulse 61, temperature 98.2 ?F (36.8 ?C), temperature source Oral, resp. rate 20, weight 95.3 kg, SpO2 100 %. ? ?Medical Problem List and Plan: ?1. Functional deficits secondary to extension of le

## 2022-04-16 NOTE — Progress Notes (Signed)
Inpatient Rehab Admissions Coordinator:  ° °I have a CIR bed for this Pt. Today. RN may call report to [phone]. ° °Jermany Rimel, MS, CCC-SLP °Rehab Admissions Coordinator  °[phone] (celll) °[phone] (office) ° °

## 2022-04-16 NOTE — Discharge Instructions (Addendum)
Inpatient Rehab Discharge Instructions  Ivan Lawson Discharge date and time:  04/25/2022  Activities/Precautions/ Functional Status: Activity: no lifting, driving, or strenuous exercise until cleared by MD Diet: cardiac diet Wound Care: none needed Functional status:  ___ No restrictions     ___ Walk up steps independently ___ 24/7 supervision/assistance   ___ Walk up steps with assistance __x_ Intermittent supervision/assistance  ___ Bathe/dress independently ___ Walk with walker     _x__ Bathe/dress with assistance ___ Walk Independently    ___ Shower independently ___ Walk with assistance    __x_ Shower with assistance _x__ No alcohol     ___ Return to work/school ________  Special Instructions:  Avoid all NSAIDs (ibuprofen, Motrin, aleve, etc.)  No driving, alcohol consumption or tobacco use.    COMMUNITY REFERRALS UPON DISCHARGE:     Outpatient: PT     OT                Agency: [facility]  Phone: [phone]              Appointment Date/Time: TBD    STROKE/TIA DISCHARGE INSTRUCTIONS SMOKING Cigarette smoking nearly doubles your risk of having a stroke & is the single most alterable risk factor  If you smoke or have smoked in the last 12 months, you are advised to quit smoking for your health. Most of the excess cardiovascular risk related to smoking disappears within a year of stopping. Ask you doctor about anti-smoking medications [place] Quit Line: 1-800-QUIT NOW Free Smoking Cessation Classes [phone]  CHOLESTEROL Know your levels; limit fat & cholesterol in your diet  Lipid Panel     Component Value Date/Time   CHOL 153 04/07/2022 0900   TRIG 72 04/07/2022 0900   HDL 38 (L) 04/07/2022 0900   CHOLHDL 4.0 04/07/2022 0900   VLDL 14 04/07/2022 0900   LDLCALC 101 (H) 04/07/2022 0900     Many patients benefit from treatment even if their cholesterol is at goal. Goal: Total Cholesterol (CHOL) less than 160 Goal:  Triglycerides (TRIG) less than 150 Goal:   HDL greater than 40 Goal:  LDL (LDLCALC) less than 100   BLOOD PRESSURE American Stroke Association blood pressure target is less that 120/80 mm/Hg  Your discharge blood pressure is:  BP: (!) 166/96 Monitor your blood pressure Limit your salt and alcohol intake Many individuals will require more than one medication for high blood pressure  DIABETES (A1c is a blood sugar average for last 3 months) Goal HGBA1c is under 7% (HBGA1c is blood sugar average for last 3 months)  Diabetes: No known diagnosis of diabetes; at risk for diabetes. Discuss this with your doctor    Lab Results  Component Value Date   HGBA1C 6.0 (H) 04/07/2022    Your HGBA1c can be lowered with medications, healthy diet, and exercise. Check your blood sugar as directed by your physician Call your physician if you experience unexplained or low blood sugars.  PHYSICAL ACTIVITY/REHABILITATION Goal is 30 minutes at least 4 days per week  Activity: Increase activity slowly, Therapies: Physical Therapy: Outpatient and Occupational Therapy: Outpatient Return to work: n/a Activity decreases your risk of heart attack and stroke and makes your heart stronger.  It helps control your weight and blood pressure; helps you relax and can improve your mood. Participate in a regular exercise program. Talk with your doctor about the best form of exercise for you (dancing, walking, swimming, cycling).  DIET/WEIGHT Goal is to maintain a healthy weight  Your discharge diet is:  Diet Order             Diet Heart Room service appropriate? Yes; Fluid consistency: Thin  Diet effective now                  thin liquids Your height is:  Height: 6' (182.9 cm) Your current weight is: Weight: 96.9 kg Your Body Mass Index (BMI) is:  BMI (Calculated): 28.97 Following the type of diet specifically designed for you will help prevent another stroke. Your goal weight range is:   Your goal Body Mass Index (BMI) is 19-24. Healthy food habits can  help reduce 3 risk factors for stroke:  High cholesterol, hypertension, and excess weight.  RESOURCES Stroke/Support Group:  Call [phone]   STROKE EDUCATION PROVIDED/REVIEWED AND GIVEN TO PATIENT Stroke warning signs and symptoms How to activate emergency medical system (call 911). Medications prescribed at discharge. Need for follow-up after discharge. Personal risk factors for stroke. Pneumonia vaccine given: No Flu vaccine given: No My questions have been answered, the writing is legible, and I understand these instructions.  I will adhere to these goals & educational materials that have been provided to me after my discharge from the hospital.      My questions have been answered and I understand these instructions. I will adhere to these goals and the provided educational materials after my discharge from the hospital.  Patient/Caregiver Signature _______________________________ Date __________  Clinician Signature _______________________________________ Date __________  Please bring this form and your medication list with you to all your follow-up doctor's appointments.

## 2022-04-16 NOTE — Progress Notes (Signed)
Physical Therapy Treatment ?Patient Details ?Name: Ivan Lawson ?MRN: [id number] ?DOB: 08-Nov-1956 ?Today's Date: 04/16/2022 ? ? ?History of Present Illness Pt is a 66 y.o. male recently d/c home 04/08/22 after admission for small acute ischemic stroke L paramedian pons, now admitted 04/12/22 with RLE weakness, diplopia, slurred speech. MRI shows increase in size of recent L pontine infarct, but otherwise no major changes. CTA with mid-basilar artery stenosis. Other PMH includes hyperthyroidism. ? ?  ?PT Comments  ? ? Making steady improvement toward goals.  Emphasis on safety in general, sit to stands, progression of gait stability with work on heel/toe pattern and management of balance challenges. ?   ?Recommendations for follow up therapy are one component of a multi-disciplinary discharge planning process, led by the attending physician.  Recommendations may be updated based on patient status, additional functional criteria and insurance authorization. ? ?Follow Up Recommendations ? Acute inpatient rehab (3hours/day) ?  ?  ?Assistance Recommended at Discharge Intermittent Supervision/Assistance  ?Patient can return home with the following A little help with walking and/or transfers;A lot of help with bathing/dressing/bathroom;Assistance with cooking/housework;Assist for transportation;Help with stairs or ramp for entrance ?  ?Equipment Recommendations ? None recommended by PT  ?  ?Recommendations for Other Services   ? ? ?  ?Precautions / Restrictions Precautions ?Precautions: Fall ?Precaution Comments: PT active in the community PTA ?Restrictions ?Weight Bearing Restrictions: No  ?  ? ?Mobility ? Bed Mobility ?Overal bed mobility: Modified Independent ?  ?  ?  ?  ?  ?  ?  ?  ? ?Transfers ?Overall transfer level: Needs assistance ?  ?Transfers: Sit to/from Stand ?Sit to Stand: Min guard ?  ?  ?  ?  ?  ?General transfer comment: cues for safety. ?  ? ?Ambulation/Gait ?Ambulation/Gait assistance: Min assist ?Gait  Distance (Feet): 105 Feet (then  100 x2 approx.  with standing propped rest to regroup from muscle fatigue. and degradation of gait pattern.) ?Assistive device: None ?Gait Pattern/deviations: Step-through pattern ?  ?Gait velocity interpretation: <1.31 ft/sec, indicative of household ambulator ?  ?General Gait Details: unsteady overall, uncoordinated R toe off and flat footed contact.  weak hip forward translation, notable drift R and stagger R if scanning L.  Consistent deviation with change of directions, scanning and backing up. ? ? ?Stairs ?  ?  ?  ?  ?  ? ? ?Wheelchair Mobility ?  ? ?Modified Rankin (Stroke Patients Only) ?Modified Rankin (Stroke Patients Only) ?Pre-Morbid Rankin Score: No significant disability ?Modified Rankin: Moderately severe disability ? ? ?  ?Balance Overall balance assessment: Needs assistance ?  ?Sitting balance-Leahy Scale: Fair ?  ?  ?  ?Standing balance-Leahy Scale: Fair ?Standing balance comment: can accept mild challenge with deviation, but not LOB ?  ?  ?  ?  ?  ?  ?  ?  ?  ?  ?  ?  ? ?  ?Cognition Arousal/Alertness: Awake/alert ?Behavior During Therapy: [facility] for tasks assessed/performed ?Overall Cognitive Status: Within Functional Limits for tasks assessed ?  ?  ?  ?  ?  ?  ?  ?  ?  ?  ?  ?  ?  ?  ?  ?  ?  ?  ?  ? ?  ?Exercises   ? ?  ?General Comments   ?  ?  ? ?Pertinent Vitals/Pain Pain Assessment ?Pain Assessment: No/denies pain ?Pain Intervention(s): Monitored during session  ? ? ?Home Living   ?  ?  ?  ?  ?  ?  ?  ?  ?  ?   ?  ?  Prior Function    ?  ?  ?   ? ?PT Goals (current goals can now be found in the care plan section) Acute Rehab PT Goals ?Patient Stated Goal: willing to do whatever he needs to do to get well ?PT Goal Formulation: With patient ?Time For Goal Achievement: 04/27/22 ?Potential to Achieve Goals: Good ?Progress towards PT goals: Progressing toward goals ? ?  ?Frequency ? ? ? Min 3X/week ? ? ? ?  ?PT Plan Current plan remains appropriate  ? ? ?Co-evaluation    ?  ?  ?  ?  ? ?  ?AM-PAC PT "6 Clicks" Mobility   ?Outcome Measure ? Help needed turning from your back to your side while in a flat bed without using bedrails?: A Little ?Help needed moving from lying on your back to sitting on the side of a flat bed without using bedrails?: A Little ?Help needed moving to and from a bed to a chair (including a wheelchair)?: A Little ?Help needed standing up from a chair using your arms (e.g., wheelchair or bedside chair)?: A Little ?Help needed to walk in hospital room?: A Little ?Help needed climbing 3-5 steps with a railing? : A Lot ?6 Click Score: 17 ? ?  ?End of Session   ?Activity Tolerance: Patient tolerated treatment well ?Patient left: in chair;with call bell/phone within reach;with chair alarm set ?Nurse Communication: Mobility status ?PT Visit Diagnosis: Other abnormalities of gait and mobility (R26.89);Other symptoms and signs involving the nervous system (R29.898) ?  ? ? ?Time: 4827-0786 ?PT Time Calculation (min) (ACUTE ONLY): 24 min ? ?Charges:  $Gait Training: 8-22 mins ?$Therapeutic Activity: 8-22 mins          ?          ? ?04/16/2022 ? ?Jacinto Halim., PT ?Acute Rehabilitation Services ?[phone]  (pager) ?[phone]  (office) ? ? ?Ivan Lawson ?04/16/2022, 12:04 PM ? ?

## 2022-04-16 NOTE — Progress Notes (Signed)
Patient ID: Ivan Lawson, male   DOB: [birth year], 66 y.o.   MRN: [id number] ?Met with the patient to review rehab process, team conference and plan of care. Patient with mild right side weakness, facial droop and is HOH.Reviewed current situation, stroke extension and residual effects. Discussed secondary stroke risks including HTN, HLD (LDL 101) on crestor and prediabetes (A1C 6.0). Patient noted he had not received 30-day heart monitor for A-fib prior to readmission and understands monitor use. Reviewed DAPT with Brilinta x 1 month then with plavix x 2 months  per neurology. Continue to follow along to discharge to address educational needs to facilitate discharge home solo with brother checking in on him.  [name] ? ?

## 2022-04-16 NOTE — Progress Notes (Signed)
PMR Admission Coordinator Pre-Admission Assessment ?  ?Patient: Ivan Lawson is an 66 y.o., male ?MRN: [id number] ?DOB: 08-14-1956 ?Height:   ?Weight: 95.3 kg ?  ?Insurance Information ?HMO:     PPO: yes     PCP:      IPA:      80/20:      OTHER:  ?PRIMARY: [employer]       Policy#: [id number]      Subscriber: Pt.  ?CM Name: Lynelle Smoke      Phone#: [phone]     Fax#: [phone] I received a call drom [place] at [employer] stating Pt. Was approved for 7 days from date of admission on 04/14/22 ?Pre-Cert#: [id number]      Employer:  ?Benefits:  Phone #:      Name: Andee Poles           ?Eff. Date: 12/02/21     Deduct: $0      Out of Pocket Max: $3200.00 ($590.00 met)      Life Max: na ?CIR: $295.00 for days 1-6, $0 for days 7-90      SNF: $0 for days 1-20, $184 a day for days 20-100 ?Outpatient:      Co-Pay: $15.00  ?Home Health: 100%      Co-Pay: 0 ?DME: 20% coinsurance       Co-Pay:  ?Providers: in network  ?SECONDARY:       Policy#:      Phone#:  ?  ?Financial Counselor:       Phone#:  ?  ?The ?Data Collection Information Summary? for patients in Inpatient Rehabilitation Facilities with attached ?Privacy Act [place] Records? was provided and verbally reviewed with: Patient ?  ?Emergency Contact Information ?Contact Information   ?  ?  Name Relation Home Work Mobile  ?  Mcgillis,Sam Brother [phone]      ?  ?   ?  ?  ?Current Medical History  ?Patient Admitting Diagnosis: CVA ?History of Present Illness: Ivan Lawson is a 66 y.o. male with medical history significant for essential hypertension, acquired hypothyroidism following radioactive iodine therapy for Graves' disease, prediabetes, who was admitted to [facility] on 04/12/2022 for further evaluation/management of potential stroke after presenting from home to [facility] ED complaining of right-sided numbness. Pt. Also with a history of a recent ischemic stroke (5/6) with right-sided weakness that had almost completely resolved. MRI which showed  new acute left paramedian pons infarction with increased extent of involvement compared with 04/06/2022. PT/OT were consulted and recommended CIR to assist return to PLOF.  ?Complete NIHSS TOTAL: 7 ?  ?Patient's medical record from [facility] has been reviewed by the rehabilitation admission coordinator and physician. ?  ?Past Medical History  ?    ?Past Medical History:  ?Diagnosis Date  ? Hyperthyroidism    ?  ?  ?Has the patient had major surgery during 100 days prior to admission? No ?  ?Family History   ?family history is not on file. ?  ?Current Medications ?  ?Current Facility-Administered Medications:  ?  acetaminophen (TYLENOL) tablet 650 mg, 650 mg, Oral, Q6H PRN **OR** acetaminophen (TYLENOL) suppository 650 mg, 650 mg, Rectal, Q6H PRN, Samella Parr, NP ?  aspirin EC tablet 81 mg, 81 mg, Oral, Daily, Samella Parr, NP, 81 mg at 04/13/22 1105 ?  enoxaparin (LOVENOX) injection 40 mg, 40 mg, Subcutaneous, Q24H, Samella Parr, NP, 40 mg at 04/13/22 1823 ?  levothyroxine (SYNTHROID) tablet 137 mcg, 137  mcg, Oral, Q0600, Samella Parr, NP, 137 mcg at 04/14/22 2671 ?  methocarbamol (ROBAXIN) 500 mg in dextrose 5 % 50 mL IVPB, 500 mg, Intravenous, Q6H PRN, Samella Parr, NP, Last Rate: 100 mL/hr at 04/13/22 2057, 500 mg at 04/13/22 2057 ?  ondansetron (ZOFRAN) tablet 4 mg, 4 mg, Oral, Q6H PRN **OR** ondansetron (ZOFRAN) injection 4 mg, 4 mg, Intravenous, Q6H PRN, Samella Parr, NP ?  rosuvastatin (CRESTOR) tablet 20 mg, 20 mg, Oral, Daily, Erin Hearing L, NP, 20 mg at 04/13/22 1105 ?  senna-docusate (Senokot-S) tablet 1 tablet, 1 tablet, Oral, QHS PRN, Samella Parr, NP ?  sodium chloride flush (NS) 0.9 % injection 3 mL, 3 mL, Intravenous, Once, Mesner, Corene Cornea, MD ?  sodium chloride flush (NS) 0.9 % injection 3 mL, 3 mL, Intravenous, Q12H, Samella Parr, NP, 3 mL at 04/13/22 2228 ?  ticagrelor (BRILINTA) tablet 90 mg, 90 mg, Oral, BID, de Yolanda Manges, Hunt E, NP, 90 mg at  04/13/22 2058 ?  vitamin B-12 (CYANOCOBALAMIN) tablet 1,000 mcg, 1,000 mcg, Oral, Daily, Samella Parr, NP, 1,000 mcg at 04/13/22 1106 ?  ?Patients Current Diet:  ?Diet Order   ?  ?         ?    Diet Heart Room service appropriate? Yes; Fluid consistency: Thin  Diet effective now       ?  ?  ?   ?  ?  ?   ?  ?  ?Precautions / Restrictions ?Precautions ?Precautions: Fall ?Precaution Comments: PT active in the community PTA ?Restrictions ?Weight Bearing Restrictions: No  ?  ?Has the patient had 2 or more falls or a fall with injury in the past year? Unknown ?  ?Prior Activity Level ?Community (5-7x/wk): Pt. was active in the community PTA ?  ?Prior Functional Level ?Self Care: Did the patient need help bathing, dressing, using the toilet or eating? Independent ?  ?Indoor Mobility: Did the patient need assistance with walking from room to room (with or without device)? Independent ?  ?Stairs: Did the patient need assistance with internal or external stairs (with or without device)? Independent ?  ?Functional Cognition: Did the patient need help planning regular tasks such as shopping or remembering to take medications? Independent ?  ?Patient Information ?Are you of Hispanic, Latino/a,or Spanish origin?: A. No, not of Hispanic, Latino/a, or Spanish origin ?What is your race?: A. White ?Do you need or want an interpreter to communicate with a doctor or health care staff?: 0. No ?  ?Patient's Response To:  ?Health Literacy and Transportation ?Is the patient able to respond to health literacy and transportation needs?: Yes ?Health Literacy - How often do you need to have someone help you when you read instructions, pamphlets, or other written material from your doctor or pharmacy?: Never ?In the past 12 months, has lack of transportation kept you from medical appointments or from getting medications?: No ?In the past 12 months, has lack of transportation kept you from meetings, work, or from getting things needed for  daily living?: No ?  ?Home Assistive Devices / Equipment ?Home Equipment: Conservation officer, nature (2 wheels), [employer] - single point, Wheelchair - manual ?  ?Prior Device Use: Indicate devices/aids used by the patient prior to current illness, exacerbation or injury? None of the above ?  ?Current Functional Level ?Cognition ?  Overall Cognitive Status: No family/caregiver present to determine baseline cognitive functioning ?Current Attention Level: Selective ?Orientation Level: Oriented X4 ?Following Commands: Follows  multi-step commands inconsistently ?Safety/Judgement: Decreased awareness of safety, Decreased awareness of deficits ?General Comments: Very motivated. At times, pt requires cuing/increased time to process and initiate ?   ?Extremity Assessment ?(includes Sensation/Coordination) ?  Upper Extremity Assessment: RUE deficits/detail ?RUE Deficits / Details: Limited ROM with R shoulder horizontal abd/add/ER/IR, forearm pronation/supination, wrist and finger strength; poor coordination, ataxia noted ?RUE Sensation: decreased light touch ?RUE Coordination: decreased fine motor, decreased gross motor  ?Lower Extremity Assessment: Defer to PT evaluation ?RLE Deficits / Details: pt reports baseline decreased R ankle DF; otherwise RLE strength 4-5/5; denies numbness/tingling ?RLE Sensation: history of peripheral neuropathy  ?   ?ADLs ?  Overall ADL's : Needs assistance/impaired ?Eating/Feeding: Set up, Sitting ?Grooming: Set up, Sitting ?Upper Body Bathing: Minimal assistance, Sitting ?Lower Body Bathing: Moderate assistance, Sitting/lateral leans, Sit to/from stand ?Upper Body Dressing : Minimal assistance, Sitting ?Lower Body Dressing: Moderate assistance, Sitting/lateral leans, Sit to/from stand ?Toilet Transfer: Min guard, Minimal assistance, Ambulation ?Toileting- Clothing Manipulation and Hygiene: Minimal assistance, Sitting/lateral lean, Sit to/from stand ?Functional mobility during ADLs: Min guard, Minimal  assistance ?General ADL Comments: Pt limited by ataxia noted on the R side,as well as weakness and poor motor planning and balance.  ?   ?Mobility ?  Overal bed mobility: Modified Independent ?General bed mobi

## 2022-04-16 NOTE — H&P (Signed)
?  ?Physical Medicine and Rehabilitation Admission H&P ?  ?  ?  ?CC: Deficits secondary to left paramedian pontine stroke on MRI ?  ?HPI: Ivan Lawson is a 66 year old male who presented to the emergency department on 04/06/2022 complaining of new onset numbness to the fingers of his right hand as well as the toes of his right foot.  Symptoms progressed to include tingling of his right arm and numbness of his right lower leg below his knee.  He also reported unsteady gait.  On arrival to the emergency department his speech was clear his face was symmetric and there was no arm drift.  Neurology, Dr. Otelia Limes, was consulted.  CT of the head revealed a 9 mm low-density in the corona radiata on the right side.  Most likely etiology for his presentation was a subacute posterior fossa lacunar infarct versus a left thalamic lacunar infarct.  MRI revealed left pontine stroke.  His CTA was negative for thrombus.  There was less than 50% bilateral carotid artery stenosis.  Dual antiplatelet therapy.  Currently receiving Brilinta 90 mg daily with aspirin 81 mg daily for 1 month.  Then will switch to Plavix and aspirin for 2 months then aspirin alone. Crestor was added for hyperlipidemia.  He will need 30-day Holter monitor upon discharge. The patient requires inpatient medicine and rehabilitation evaluations and services for ongoing dysfunction secondary to pontine stroke. ?  ?Past medical history significant for Graves' disease status post radioactive iodine ablation.  History of primary hypertension, chronic kidney disease stage IIIa, B12 deficiency and sinus bradycardia. ? ?He reports RUE strength is improving each day. Dysarthria also improving.  Reports he had constipation but it improved after a BM today.  ? ?He would like to get better so he can enjoy his hobby of fishing.  ? ?Review of Systems  ?Constitutional:  Negative for chills and fever.  ?HENT:  Negative for hearing loss and sore throat.   ?Eyes:  Negative for  blurred vision and double vision.  ?Respiratory:  Negative for cough and sputum production.   ?Cardiovascular:  Negative for chest pain and palpitations.  ?Gastrointestinal:  Negative for abdominal pain, constipation, nausea and vomiting.  ?Genitourinary:  Positive for urgency. Negative for dysuria.  ?Musculoskeletal:  Negative for back pain and neck pain.  ?Neurological:  Negative for dizziness and headaches.  ?Psychiatric/Behavioral:  Negative for depression and suicidal ideas.   ?    ?Past Medical History:  ?Diagnosis Date  ? Hyperthyroidism    ?  ?No past surgical history on file. ?     ?Family History  ?Problem Relation Age of Onset  ? Thyroid disease Neg Hx    ?  ?Social History:  reports that he has never smoked. He has never used smokeless tobacco. He reports that he does not currently use alcohol. He reports that he does not currently use drugs. ?Allergies: No Known Allergies ?      ?Medications Prior to Admission  ?Medication Sig Dispense Refill  ? aspirin 81 MG EC tablet Take 1 tablet (81 mg total) by mouth daily. Swallow whole. 30 tablet 11  ? hydrALAZINE (APRESOLINE) 50 MG tablet Take 1 tablet (50 mg total) by mouth 3 (three) times daily. 90 tablet 0  ? levothyroxine (SYNTHROID) 137 MCG tablet Take 137 mcg by mouth every morning.      ? naproxen sodium (ALEVE) 220 MG tablet Take 440 mg by mouth daily as needed (pain).      ? rosuvastatin (CRESTOR) 20  MG tablet Take 1 tablet (20 mg total) by mouth daily. 30 tablet 0  ? vitamin B-12 (CYANOCOBALAMIN) 1000 MCG tablet Take 1,000 mcg by mouth daily.      ? [DISCONTINUED] amLODipine (NORVASC) 10 MG tablet Take 1 tablet (10 mg total) by mouth daily.   3  ? [DISCONTINUED] clopidogrel (PLAVIX) 75 MG tablet Take 1 tablet (75 mg total) by mouth daily. 20 tablet 0  ?  ?  ?  ?  ?Home: ?Home Living ?Family/patient expects to be discharged to:: Private residence ?Living Arrangements: Alone ?Available Help at Discharge: Family, Available 24 hours/day ?Type of Home:  House ?Home Access: Stairs to enter ?Entrance Stairs-Number of Steps: 6 ?Entrance Stairs-Rails: Right, Left ?Home Layout: One level ?Bathroom Shower/Tub: Tub/shower unit, Walk-in shower ?Bathroom Toilet: Standard ?Bathroom Accessibility: Yes ?Home Equipment: Agricultural consultant (2 wheels), [employer] - single point, Wheelchair - manual ?Additional Comments: Brother can provide support ? Lives With: Alone ?  ?Functional History: ?Prior Function ?Prior Level of Function : Independent/Modified Independent, Driving ?Mobility Comments: Retired, indep without DME; brother has been providing support since recent d/c home after stroke ?  ?Functional Status:  ?Mobility: ?Bed Mobility ?Overal bed mobility: Modified Independent ?General bed mobility comments: Increased time, R side lagging until pt was aware and then he self correct to normalize transition to sitting. ?Transfers ?Overall transfer level: Needs assistance ?Equipment used: None ?Transfers: Sit to/from Stand ?Sit to Stand: Min guard ?General transfer comment: cues for safety. ?Ambulation/Gait ?Ambulation/Gait assistance: Min assist ?Gait Distance (Feet): 105 Feet (then  100 x2 approx.  with standing propped rest to regroup from muscle fatigue. and degradation of gait pattern.) ?Assistive device: None ?Gait Pattern/deviations: Step-through pattern ?General Gait Details: unsteady overall, uncoordinated R toe off and flat footed contact.  weak hip forward translation, notable drift R and stagger R if scanning L.  Consistent deviation with change of directions, scanning and backing up. ?Gait velocity interpretation: <1.31 ft/sec, indicative of household ambulator ?  ?ADL: ?ADL ?Overall ADL's : Needs assistance/impaired ?Eating/Feeding: Set up, Sitting ?Grooming: Set up, Sitting ?Upper Body Bathing: Minimal assistance, Sitting ?Lower Body Bathing: Moderate assistance, Sitting/lateral leans, Sit to/from stand ?Upper Body Dressing : Minimal assistance, Sitting ?Lower Body Dressing:  Moderate assistance, Sitting/lateral leans, Sit to/from stand ?Toilet Transfer: Min guard, Minimal assistance, Ambulation ?Toileting- Clothing Manipulation and Hygiene: Minimal assistance, Sitting/lateral lean, Sit to/from stand ?Functional mobility during ADLs: Min guard, Minimal assistance ?General ADL Comments: Pt limited by ataxia noted on the R side,as well as weakness and poor motor planning and balance. ?  ?Cognition: ?Cognition ?Overall Cognitive Status: Within Functional Limits for tasks assessed ?Orientation Level: Oriented X4 ?Cognition ?Arousal/Alertness: Awake/alert ?Behavior During Therapy: [facility] for tasks assessed/performed ?Overall Cognitive Status: Within Functional Limits for tasks assessed ?Area of Impairment: Attention, Following commands, Safety/judgement, Awareness, Problem solving ?Current Attention Level: Selective ?Following Commands: Follows multi-step commands inconsistently ?Safety/Judgement: Decreased awareness of safety, Decreased awareness of deficits ?Awareness: Emergent ?Problem Solving: Requires verbal cues ?General Comments: Very motivated. At times, pt requires cuing/increased time to process and initiate ?  ?Physical Exam: ?Blood pressure (!) 158/88, pulse 61, temperature 98.2 ?F (36.8 ?C), temperature source Oral, resp. rate 20, weight 95.3 kg, SpO2 100 %. ?Physical Exam ? ?General: Alert and oriented x 3, No apparent distress ?HEENT: Head is normocephalic, atraumatic, PERRLA, EOMI, sclera anicteric, oral mucosa pink and moist, HOH ?Neck: Supple without JVD or lymphadenopathy ?Heart: Reg rate and rhythm. No murmurs rubs or gallops ?Chest: CTA bilaterally without wheezes, rales, or rhonchi; no  distress ?Abdomen: Soft, non-tender, non-distended, bowel sounds positive. ?Extremities: No clubbing, cyanosis, or edema. Pulses are 2+ ?Psych: Pt's affect is appropriate. Pt is cooperative ?Skin: Clean and intact without signs of breakdown ?Neuro:  R facial droop, dysarthria ?Alert and  oriented x3,  2-12 intact other than slightly decreased sensation on R, facial droop on R ?LT sensation intact all 4 extrem ? ?Musculoskeletal:  ? ? ? ?Constitutional:   ? ?General: Alert and oriented x 3, No

## 2022-04-16 NOTE — TOC Transition Note (Signed)
Transition of Care (TOC) - CM/SW Discharge Note ? ? ?Patient Details  ?Name: Ivan Lawson ?MRN: [id number] ?Date of Birth: 11/27/1956 ? ?Transition of Care (TOC) CM/SW Contact:  ?Kermit Balo, RN ?Phone Number: ?04/16/2022, 10:48 AM ? ? ?Clinical Narrative:    ?Patient is discharging to CIR today. CM signing off.  ? ? ?Final next level of care: IP Rehab Facility ?Barriers to Discharge: No Barriers Identified ? ? ?Patient Goals and CMS Choice ?  ?  ?Choice offered to / list presented to : Patient ? ?Discharge Placement ?  ?           ?  ?  ?  ?  ? ?Discharge Plan and Services ?  ?  ?           ?  ?  ?  ?  ?  ?  ?  ?  ?  ?  ? ?Social Determinants of Health (SDOH) Interventions ?  ? ? ?Readmission Risk Interventions ?   ? View : No data to display.  ?  ?  ?  ? ? ? ? ? ?

## 2022-04-16 NOTE — Discharge Summary (Signed)
?Physician Discharge Summary ?  ?Patient: Ivan Lawson MRN: [id number] DOB: 06/15/1956  ?Admit date:     04/12/2022  ?Discharge date: 04/16/22  ?Discharge Physician: Cordelia Poche, MD  ? ?PCP: Gaynelle Arabian, MD  ? ?Recommendations at discharge:  ?Outpatient neurology follow-up ?Brilinta/Aspirin for 30 days, followed by Plavix/Aspirin for 60 days, followed by Aspirin indefinitely ? ?Discharge Diagnoses: ?Principal Problem: ?  Cerebral thrombosis with cerebral infarction ?Active Problems: ?  HTN (hypertension) ?  Hypothyroidism following radioiodine therapy ?  B12 deficiency ?  Acute ischemic stroke ([place]) ?  Stroke-like symptom ?  Basilar artery stenosis ? ?Resolved Problems: ?  * No resolved hospital problems. * ? ?Hospital Course: ?Ivan Lawson is a 66 y.o. male with a history of hypertension, Grave's disease now with hypothyroidism, CKD stage IIIa, prediabetes who presented secondary to right sided weakness and worsening dysarthria. MRI confirmed evolution of previously diagnosed stroke. ? ?Assessment and Plan: ? ?Acute CVA ?Basilar artery stenosis ?MRI confirms worsening of previously diagnosed left paramedian pontine infarct. CTA head and neck significant for stenosis of mid basilar, left proximal PCA and right A1 origin. LDL of 101. Hemoglobin A1C of 6.0%. Repeat Transthoracic Echocardiogram not obtained. Neurology recommendations for Aspirin/Brilinta for one (1) month, followed by Aspirin/Plavix for two (2) months followed by aspirin monotherapy. PT/OT recommendations for inpatient rehabilitation. ?  ?Acquired hypothyroidism ?TSH checked on admission and is 1.411. Patient is on Synthroid 137 mcg daily as an outpatient. Continue on discharge. ?  ?Primary hypertension ?Patient is on amlodipine and hydralazine as an outpatient. Held on admission. Currently hypertension. Neurology recommendations to avoid hypotension. Will restart on Amlodipine 2.5 mg daily. Discontinue hydralazine. ?  ?CKD stage  IIIa ?Stable. ?  ?History of sinus bradycardia ?Noted. ? ? ?Consultants: Neurology ?Procedures performed: Transthoracic Echocardiogram  ?Disposition: Inpatient rehab ?Diet recommendation:  ?Cardiac diet ?DISCHARGE MEDICATION: ?Allergies as of 04/16/2022   ?No Known Allergies ?  ? ?  ?Medication List  ?  ? ?STOP taking these medications   ? ?hydrALAZINE 50 MG tablet ?Commonly known as: APRESOLINE ?  ?naproxen sodium 220 MG tablet ?Commonly known as: ALEVE ?  ? ?  ? ?TAKE these medications   ? ?amLODipine 2.5 MG tablet ?Commonly known as: NORVASC ?Take 1 tablet (2.5 mg total) by mouth daily. ?What changed:  ?medication strength ?how much to take ?  ?Aspirin Low Dose 81 MG EC tablet ?Generic drug: aspirin ?Take 1 tablet (81 mg total) by mouth daily. Swallow whole. ?  ?clopidogrel 75 MG tablet ?Commonly known as: PLAVIX ?Take 1 tablet (75 mg total) by mouth daily. Starting on 6/11, take for 60 days (2 months) ?Start taking on: May 12, 2022 ?What changed:  ?additional instructions ?These instructions start on May 12, 2022. If you are unsure what to do until then, ask your doctor or other care provider. ?  ?levothyroxine 137 MCG tablet ?Commonly known as: SYNTHROID ?Take 137 mcg by mouth every morning. ?  ?rosuvastatin 20 MG tablet ?Commonly known as: CRESTOR ?Take 1 tablet (20 mg total) by mouth daily. ?  ?ticagrelor 90 MG Tabs tablet ?Commonly known as: BRILINTA ?Take 1 tablet (90 mg total) by mouth 2 (two) times daily for 26 days. ?  ?vitamin B-12 1000 MCG tablet ?Commonly known as: CYANOCOBALAMIN ?Take 1,000 mcg by mouth daily. ?  ? ?  ? ? Follow-up Information   ? ? Alric Ran, MD. Daphane Shepherd on 05/13/2022.   ?Specialty: Neurology ?Contact information: ?[address] ?[address] ?[place] [address] ?[phone] ? ? ?  ?  ? ?  ?  ? ?  ? ?  Discharge Exam: ?BP (!) 152/77 (BP Location: Left Arm)   Pulse (!) 56   Temp 98.1 ?F (36.7 ?C) (Oral)   Resp 20   Wt 95.3 kg   SpO2 100%   BMI 29.29 kg/m?  ? ?General exam: Appears  calm and comfortable ?Respiratory system: Respiratory effort normal. ?Gastrointestinal system: Abdomen is non-distended ?Central nervous system: Alert and oriented. 4/5 RUE strength. Dysarthria. ?Musculoskeletal: No edema. No calf tenderness ?Skin: No cyanosis. No rashes ?Psychiatry: Judgement and insight appear normal. Mood & affect appropriate.  ? ?Condition at discharge: stable ? ?The results of significant diagnostics from this hospitalization (including imaging, microbiology, ancillary and laboratory) are listed below for reference.  ? ?Imaging Studies: ?CT ANGIO HEAD NECK W WO CM ? ?Result Date: 04/06/2022 ?CLINICAL DATA:  Neuro deficit, acute, stroke suspected. EXAM: CT ANGIOGRAPHY HEAD AND NECK TECHNIQUE: Multidetector CT imaging of the head and neck was performed using the standard protocol during bolus administration of intravenous contrast. Multiplanar CT image reconstructions and MIPs were obtained to evaluate the vascular anatomy. Carotid stenosis measurements (when applicable) are obtained utilizing NASCET criteria, using the distal internal carotid diameter as the denominator. RADIATION DOSE REDUCTION: This exam was performed according to the departmental dose-optimization program which includes automated exposure control, adjustment of the mA and/or kV according to patient size and/or use of iterative reconstruction technique. CONTRAST:  88mL OMNIPAQUE IOHEXOL 350 MG/ML SOLN COMPARISON:  Head CT same day FINDINGS: CTA NECK FINDINGS Aortic arch: Minimal aortic atherosclerosis. Branching pattern is normal. No origin stenosis. Right carotid system: Common carotid artery widely patent to the bifurcation. Minimal plaque at the carotid bifurcation and ICA bulb but no stenosis. Cervical ICA widely patent. Left carotid system: Common carotid artery widely patent to the bifurcation. Calcified plaque at the carotid bifurcation and ICA bulb. No stenosis compared to the more distal cervical ICA however. Distal  cervical ICA is normal. Vertebral arteries: Right vertebral artery is dominant. Left vertebral artery takes an early origin from the subclavian. No vertebral artery origin stenosis on either side. Both vessels are patent through the cervical region to the foramen magnum. Skeleton: Mild spondylosis. Other neck: No mass or lymphadenopathy.  Lung apices are clear. Upper chest: Lung apices are clear. Review of the MIP images confirms the above findings CTA HEAD FINDINGS Anterior circulation: Both internal carotid arteries are patent through the skull base and siphon regions. There is siphon atherosclerotic calcification with stenosis estimated at 30-50% in the distal siphon region on both sides. The anterior and middle cerebral vessels are patent without large vessel occlusion or correctable proximal stenosis. No aneurysm or vascular malformation. Posterior circulation: Both vertebral arteries are patent through the foramen magnum. The left terminates in PICA. The right vertebral artery supplies the basilar. The basilar artery is a small vessel due to primary fetal origin of both posterior cerebral arteries. No posterior circulation branch vessel occlusion. 50% stenosis of the left P1 segment. Venous sinuses: Patent and normal. Anatomic variants: None significant. Review of the MIP images confirms the above findings IMPRESSION: No intracranial large vessel occlusion. No flow limiting proximal stenosis. 50% left PCA P1 stenosis. Primary anterior circulation supply to the posterior cerebral arteries. Atherosclerotic change at both carotid bifurcations, left more than right, but without stenosis by NASCET criteria. Atherosclerotic calcification in the carotid siphon regions with 30-50% stenosis in the distal siphon region on both sides. Electronically Signed   By: Nelson Chimes M.D.   On: 04/06/2022 19:55  ? ?CT HEAD WO CONTRAST ? ?Result  Date: 04/06/2022 ?CLINICAL DATA:  Neuro deficit, acute, stroke suspected. EXAM: CT HEAD  WITHOUT CONTRAST TECHNIQUE: Contiguous axial images were obtained from the base of the skull through the vertex without intravenous contrast. RADIATION DOSE REDUCTION: This exam was performed according to the departmental do

## 2022-04-16 NOTE — Progress Notes (Signed)
Occupational Therapy Treatment ?Patient Details ?Name: Ivan Lawson ?MRN: [id number] ?DOB: January 19, 1956 ?Today's Date: 04/16/2022 ? ? ?History of present illness Pt is a 66 y.o. male recently d/c home 04/08/22 after admission for small acute ischemic stroke L paramedian pons, now admitted 04/12/22 with RLE weakness, diplopia, slurred speech. MRI shows increase in size of recent L pontine infarct, but otherwise no major changes. CTA with mid-basilar artery stenosis. Other PMH includes hyperthyroidism. ?  ?OT comments ? Patient seated in recliner and eager to work on R UE coordination.  Patient verbalizing difficulty with R hand extension, educated on SROM stretches due to mild tightness of forearm, wrist and hand. Provided further exercises for [facility].  R UE fatigues easily, reinforced importance of resting UE as well.  Continue to recommend AIR. Will follow.   ? ?Recommendations for follow up therapy are one component of a multi-disciplinary discharge planning process, led by the attending physician.  Recommendations may be updated based on patient status, additional functional criteria and insurance authorization. ?   ?Follow Up Recommendations ? Acute inpatient rehab (3hours/day)  ?  ?Assistance Recommended at Discharge Intermittent Supervision/Assistance  ?Patient can return home with the following ? A lot of help with walking and/or transfers;A little help with bathing/dressing/bathroom;Assistance with cooking/housework;Direct supervision/assist for medications management;Help with stairs or ramp for entrance;Assist for transportation ?  ?Equipment Recommendations ? None recommended by OT  ?  ?Recommendations for Other Services Rehab consult ? ?  ?Precautions / Restrictions Precautions ?Precautions: Fall ?Precaution Comments: PT active in the community PTA ?Restrictions ?Weight Bearing Restrictions: No  ? ? ?  ? ?Mobility Bed Mobility ?  ?  ?  ?  ?  ?  ?  ?General bed mobility comments: up in chair ?  ? ?Transfers ?  ?   ?  ?  ?  ?  ?  ?  ?  ?  ?  ?  ?Balance   ?  ?  ?  ?  ?  ?  ?  ?  ?  ?  ?  ?  ?  ?  ?  ?  ?  ?  ?   ? ?ADL either performed or assessed with clinical judgement  ? ?ADL   ?  ?  ?  ?  ?  ?  ?  ?  ?  ?  ?  ?  ?  ?  ?  ?  ?  ?  ?  ?  ?  ? ?Extremity/Trunk Assessment   ?  ?  ?  ?  ?  ? ?Vision   ?  ?  ?Perception   ?  ?Praxis   ?  ? ?Cognition Arousal/Alertness: Awake/alert ?Behavior During Therapy: [facility] for tasks assessed/performed ?Overall Cognitive Status: Impaired/Different from baseline ?Area of Impairment: Problem solving, Following commands, Attention ?  ?  ?  ?  ?  ?  ?  ?  ?  ?Current Attention Level: Selective ?  ?Following Commands: Follows multi-step commands with increased time ?  ?  ?Problem Solving: Slow processing, Difficulty sequencing, Requires verbal cues ?General Comments: highly motivated, slow processing and increased time for multiple step commands ?  ?  ?   ?Exercises Exercises: Other exercises ?Other Exercises ?Other Exercises: Engaged in "prayer stretch" for hand/wrist extension and self ROM for supination, x 5 reps 2 sets ?Other Exercises: Completing tip to tip (difficulty reaching ring/pinky finger) and finger isolation extension on table. ? ?  ?Shoulder Instructions   ? ? ?  ?  General Comments session focused on R UE stretching and exercises  ? ? ?Pertinent Vitals/ Pain       Pain Assessment ?Pain Assessment: No/denies pain ?Pain Intervention(s): Monitored during session ? ?Home Living   ?  ?  ?  ?  ?  ?  ?  ?  ?  ?  ?  ?  ?  ?  ?  ?  ?  ?  ? ?  ?Prior Functioning/Environment    ?  ?  ?  ?   ? ?Frequency ? Min 2X/week  ? ? ? ? ?  ?Progress Toward Goals ? ?OT Goals(current goals can now be found in the care plan section) ? Progress towards OT goals: Progressing toward goals ? ?Acute Rehab OT Goals ?Patient Stated Goal: rehab ?OT Goal Formulation: With patient ?Time For Goal Achievement: 04/27/22 ?Potential to Achieve Goals: Good  ?Plan Discharge plan remains appropriate;Frequency remains  appropriate   ? ?Co-evaluation ? ? ?   ?  ?  ?  ?  ? ?  ?AM-PAC OT "6 Clicks" Daily Activity     ?Outcome Measure ? ? Help from another person eating meals?: A Little ?Help from another person taking care of personal grooming?: A Little ?Help from another person toileting, which includes using toliet, bedpan, or urinal?: A Little ?Help from another person bathing (including washing, rinsing, drying)?: A Lot ?Help from another person to put on and taking off regular upper body clothing?: A Little ?Help from another person to put on and taking off regular lower body clothing?: A Lot ?6 Click Score: 16 ? ?  ?End of Session   ? ?OT Visit Diagnosis: Unsteadiness on feet (R26.81);Other abnormalities of gait and mobility (R26.89);Muscle weakness (generalized) (M62.81);Hemiplegia and hemiparesis;Ataxia, unspecified (R27.0) ?Hemiplegia - Right/Left: Right ?Hemiplegia - dominant/non-dominant: Dominant ?Hemiplegia - caused by: Cerebral infarction ?  ?Activity Tolerance Patient tolerated treatment well ?  ?Patient Left in chair;with call bell/phone within reach;Other (comment) (PT present) ?  ?Nurse Communication Mobility status ?  ? ?   ? ?Time: 0051-1021 ?OT Time Calculation (min): 22 min ? ?Charges: OT General Charges ?$OT Visit: 1 Visit ?OT Treatments ?$Self Care/Home Management : 8-22 mins ? ?Barry Brunner, OT ?Acute Rehabilitation Services ?Pager [phone] ?Office [phone] ? ? ?Chancy Milroy ?04/16/2022, 12:30 PM ?

## 2022-04-16 NOTE — Progress Notes (Signed)
Inpatient Rehabilitation Admission Medication Review by a Pharmacist ? ?A complete drug regimen review was completed for this patient to identify any potential clinically significant medication issues. ? ?High Risk Drug Classes Is patient taking? Indication by Medication  ?Antipsychotic Yes Compazine- N/V  ?Anticoagulant Yes Lovenox- VTE prophylaxis  ?Antibiotic No   ?Opioid No   ?Antiplatelet Yes Aspirin, Ticagrelor- CVA prophylaxis  ?Hypoglycemics/insulin No   ?Vasoactive Medication No   ?Chemotherapy No   ?Other Yes Crestor- HLD ?Synthroid- hypothyroidism  ? ? ? ?Type of Medication Issue Identified Description of Issue Recommendation(s)  ?Drug Interaction(s) (clinically significant) ?    ?Duplicate Therapy ?    ?Allergy ?    ?No Medication Administration End Date ? Brilinta Per neurology- patient is to take Brilinta / aspirin combination for 30 days (End date for Brilinta: 05/11/2022)  ?Incorrect Dose ?    ?Additional Drug Therapy Needed ?    ?Significant med changes from prior encounter (inform family/care partners about these prior to discharge).    ?Other ?    ? ? ?Clinically significant medication issues were identified that warrant physician communication and completion of prescribed/recommended actions by midnight of the next day:  No ? ?Comments pertinent for care: neurology wants the following: ? ?Brilinta: end after 30 days (05/11/2022)  ? ?followed by  ? ?Plavix / Aspirin: 60 days (05/12/2022 through 07/11/2022)  ? ?followed by  ? ?aspirin alone (start 07/12/2022) ? ?Time spent performing this drug regimen review (minutes):  30 ? ?Stellar Gensel BS, PharmD, BCPS ?Clinical Pharmacist ?04/16/2022 3:21 PM ? ?Contact: [phone] after 3 PM ? ?"Be curious, not judgmental..." -Debbora Dus ?

## 2022-04-16 NOTE — Plan of Care (Signed)
Pt is alert oriented x 4. Pt has right sided weakness. Numbness and tingling to fingertips and toes to right side. Pt c/o spasms to right leg, prn robaxin given, effective.  ? ? ?Problem: Education: ?Goal: Knowledge of General Education information will improve ?Description: Including pain rating scale, medication(s)/side effects and non-pharmacologic comfort measures ?Outcome: Progressing ?  ?Problem: Health Behavior/Discharge Planning: ?Goal: Ability to manage health-related needs will improve ?Outcome: Progressing ?  ?Problem: Clinical Measurements: ?Goal: Ability to maintain clinical measurements within normal limits will improve ?Outcome: Progressing ?Goal: Will remain free from infection ?Outcome: Progressing ?Goal: Diagnostic test results will improve ?Outcome: Progressing ?Goal: Respiratory complications will improve ?Outcome: Progressing ?Goal: Cardiovascular complication will be avoided ?Outcome: Progressing ?  ?Problem: Activity: ?Goal: Risk for activity intolerance will decrease ?Outcome: Progressing ?  ?Problem: Nutrition: ?Goal: Adequate nutrition will be maintained ?Outcome: Progressing ?  ?Problem: Coping: ?Goal: Level of anxiety will decrease ?Outcome: Progressing ?  ?Problem: Elimination: ?Goal: Will not experience complications related to bowel motility ?Outcome: Progressing ?Goal: Will not experience complications related to urinary retention ?Outcome: Progressing ?  ?Problem: Pain Managment: ?Goal: General experience of comfort will improve ?Outcome: Progressing ?  ?Problem: Safety: ?Goal: Ability to remain free from injury will improve ?Outcome: Progressing ?  ?Problem: Skin Integrity: ?Goal: Risk for impaired skin integrity will decrease ?Outcome: Progressing ?  ?Problem: Education: ?Goal: Knowledge of disease or condition will improve ?Outcome: Progressing ?Goal: Knowledge of secondary prevention will improve (SELECT ALL) ?Outcome: Progressing ?Goal: Knowledge of patient specific risk factors  will improve (INDIVIDUALIZE FOR PATIENT) ?Outcome: Progressing ?Goal: Individualized Educational Video(s) ?Outcome: Progressing ?  ?Problem: Coping: ?Goal: Will verbalize positive feelings about self ?Outcome: Progressing ?Goal: Will identify appropriate support needs ?Outcome: Progressing ?  ?Problem: Health Behavior/Discharge Planning: ?Goal: Ability to manage health-related needs will improve ?Outcome: Progressing ?  ?Problem: Self-Care: ?Goal: Ability to participate in self-care as condition permits will improve ?Outcome: Progressing ?Goal: Verbalization of feelings and concerns over difficulty with self-care will improve ?Outcome: Progressing ?Goal: Ability to communicate needs accurately will improve ?Outcome: Progressing ?  ?

## 2022-04-17 DIAGNOSIS — I639 Cerebral infarction, unspecified: Secondary | ICD-10-CM | POA: Diagnosis not present

## 2022-04-17 LAB — CBC WITH DIFFERENTIAL/PLATELET
Abs Immature Granulocytes: 0.03 10*3/uL (ref 0.00–0.07)
Basophils Absolute: 0.1 10*3/uL (ref 0.0–0.1)
Basophils Relative: 1 %
Eosinophils Absolute: 0.1 10*3/uL (ref 0.0–0.5)
Eosinophils Relative: 1 %
HCT: 44.4 % (ref 39.0–52.0)
Hemoglobin: 15.4 g/dL (ref 13.0–17.0)
Immature Granulocytes: 0 %
Lymphocytes Relative: 25 %
Lymphs Abs: 1.9 10*3/uL (ref 0.7–4.0)
MCH: 29.5 pg (ref 26.0–34.0)
MCHC: 34.7 g/dL (ref 30.0–36.0)
MCV: 85.1 fL (ref 80.0–100.0)
Monocytes Absolute: 0.6 10*3/uL (ref 0.1–1.0)
Monocytes Relative: 8 %
Neutro Abs: 5 10*3/uL (ref 1.7–7.7)
Neutrophils Relative %: 65 %
Platelets: 214 10*3/uL (ref 150–400)
RBC: 5.22 MIL/uL (ref 4.22–5.81)
RDW: 12.9 % (ref 11.5–15.5)
WBC: 7.7 10*3/uL (ref 4.0–10.5)
nRBC: 0 % (ref 0.0–0.2)

## 2022-04-17 LAB — COMPREHENSIVE METABOLIC PANEL
ALT: 23 U/L (ref 0–44)
AST: 24 U/L (ref 15–41)
Albumin: 3.6 g/dL (ref 3.5–5.0)
Alkaline Phosphatase: 51 U/L (ref 38–126)
Anion gap: 6 (ref 5–15)
BUN: 19 mg/dL (ref 8–23)
CO2: 25 mmol/L (ref 22–32)
Calcium: 9.2 mg/dL (ref 8.9–10.3)
Chloride: 106 mmol/L (ref 98–111)
Creatinine, Ser: 1.38 mg/dL — ABNORMAL HIGH (ref 0.61–1.24)
GFR, Estimated: 56 mL/min — ABNORMAL LOW (ref >60)
Glucose, Bld: 95 mg/dL (ref 70–99)
Potassium: 4.6 mmol/L (ref 3.5–5.1)
Sodium: 137 mmol/L (ref 135–145)
Total Bilirubin: 1.2 mg/dL (ref 0.3–1.2)
Total Protein: 6.5 g/dL (ref 6.5–8.1)

## 2022-04-17 NOTE — Plan of Care (Signed)
?Problem: RH Balance ?Goal: LTG Patient will maintain dynamic standing with ADLs (OT) ?Description: LTG:  Patient will maintain dynamic standing balance with assist during activities of daily living (OT)  ?Flowsheets (Taken 04/17/2022 1025) ?LTG: Pt will maintain dynamic standing balance during ADLs with: Independent with assistive device ?  ?Problem: Sit to Stand ?Goal: LTG:  Patient will perform sit to stand in prep for activites of daily living with assistance level (OT) ?Description: LTG:  Patient will perform sit to stand in prep for activites of daily living with assistance level (OT) ?Flowsheets (Taken 04/17/2022 1025) ?LTG: PT will perform sit to stand in prep for activites of daily living with assistance level: Independent with assistive device ?  ?Problem: RH Eating ?Goal: LTG Patient will perform eating w/assist, cues/equip (OT) ?Description: LTG: Patient will perform eating with assist, with/without cues using equipment (OT) ?Flowsheets (Taken 04/17/2022 1025) ?LTG: Pt will perform eating with assistance level of: Independent with assistive device  ?  ?Problem: RH Grooming ?Goal: LTG Patient will perform grooming w/assist,cues/equip (OT) ?Description: LTG: Patient will perform grooming with assist, with/without cues using equipment (OT) ?Flowsheets (Taken 04/17/2022 1025) ?LTG: Pt will perform grooming with assistance level of: Independent with assistive device  ?  ?Problem: RH Bathing ?Goal: LTG Patient will bathe all body parts with assist levels (OT) ?Description: LTG: Patient will bathe all body parts with assist levels (OT) ?Flowsheets (Taken 04/17/2022 1025) ?LTG: Pt will perform bathing with assistance level/cueing: Independent with assistive device  ?  ?Problem: RH Dressing ?Goal: LTG Patient will perform upper body dressing (OT) ?Description: LTG Patient will perform upper body dressing with assist, with/without cues (OT). ?Flowsheets (Taken 04/17/2022 1025) ?LTG: Pt will perform upper body dressing  with assistance level of: Independent ?Goal: LTG Patient will perform lower body dressing w/assist (OT) ?Description: LTG: Patient will perform lower body dressing with assist, with/without cues in positioning using equipment (OT) ?Flowsheets (Taken 04/17/2022 1025) ?LTG: Pt will perform lower body dressing with assistance level of: Independent with assistive device ?  ?Problem: RH Functional Use of Upper Extremity ?Goal: LTG Patient will use RT/LT upper extremity as a (OT) ?Description: LTG: Patient will use right/left upper extremity as a stabilizer/gross assist/diminished/nondominant/dominant level with assist, with/without cues during functional activity (OT) ?Flowsheets (Taken 04/17/2022 1025) ?LTG: Use of upper extremity in functional activities: RUE as dominant level ?LTG: Pt will use upper extremity in functional activity with assistance level of: Independent with assistive device ?  ?Problem: RH Simple Meal Prep ?Goal: LTG Patient will perform simple meal prep w/assist (OT) ?Description: LTG: Patient will perform simple meal prep with assistance, with/without cues (OT). ?Flowsheets (Taken 04/17/2022 1025) ?LTG: Pt will perform simple meal prep with assistance level of: Supervision/Verbal cueing ?  ?Problem: RH Toilet Transfers ?Goal: LTG Patient will perform toilet transfers w/assist (OT) ?Description: LTG: Patient will perform toilet transfers with assist, with/without cues using equipment (OT) ?Flowsheets (Taken 04/17/2022 1025) ?LTG: Pt will perform toilet transfers with assistance level of: Independent with assistive device ?  ?Problem: RH Tub/Shower Transfers ?Goal: LTG Patient will perform tub/shower transfers w/assist (OT) ?Description: LTG: Patient will perform tub/shower transfers with assist, with/without cues using equipment (OT) ?Flowsheets (Taken 04/17/2022 1025) ?LTG: Pt will perform tub/shower stall transfers with assistance level of: Independent with assistive device ?  ?Problem: RH Furniture  Transfers ?Goal: LTG Patient will perform furniture transfers w/assist (OT/PT) ?Description: LTG: Patient will perform furniture transfers  with assistance (OT/PT). ?Flowsheets (Taken 04/17/2022 1025) ?LTG: Pt will perform furniture transfers with assist::  Independent with assistive device  ?  ?

## 2022-04-17 NOTE — Progress Notes (Signed)
?                                                       PROGRESS NOTE ? ? ?Subjective/Complaints: ? ?No new c/os , had BM yesterday after several days of constipation  ? ?ROS- neg CP, SOB, N/V/D ? ?Objective: ?  ?No results found. ?Recent Labs  ?  04/17/22 ?0624  ?WBC 7.7  ?HGB 15.4  ?HCT 44.4  ?PLT 214  ? ?Recent Labs  ?  04/17/22 ?0624  ?NA 137  ?K 4.6  ?CL 106  ?CO2 25  ?GLUCOSE 95  ?BUN 19  ?CREATININE 1.38*  ?CALCIUM 9.2  ? ? ?Intake/Output Summary (Last 24 hours) at 04/17/2022 0813 ?Last data filed at 04/17/2022 [id number] ?Gross per 24 hour  ?Intake 195 ml  ?Output 1 ml  ?Net 194 ml  ?  ? ?  ? ?Physical Exam: ?Vital Signs ?Blood pressure (!) 144/88, pulse 65, temperature 97.7 ?F (36.5 ?C), resp. rate 18, height 6' (1.829 m), weight 96.9 kg, SpO2 96 %. ? ? ?General: No acute distress ?Mood and affect are appropriate ?Heart: Regular rate and rhythm no rubs murmurs or extra sounds ?Lungs: Clear to auscultation, breathing unlabored, no rales or wheezes ?Abdomen: Positive bowel sounds, soft nontender to palpation, nondistended ?Extremities: No clubbing, cyanosis, or edema ?Skin: No evidence of breakdown, no evidence of rash ?Neurologic: Cranial nerves II through XII intact, motor strength is 5/5 in left and 4/5 Right  deltoid, bicep, tricep, grip, hip flexor, knee extensors, ankle dorsiflexor and plantar flexor ?Sensory exam normal sensation to light touch and proprioception in bilateral upper and lower extremities ? ?Musculoskeletal: Full range of motion in all 4 extremities. No joint swelling  ? ?Assessment/Plan: ?1. Functional deficits which require 3+ hours per day of interdisciplinary therapy in a comprehensive inpatient rehab setting. ?Physiatrist is providing close team supervision and 24 hour management of active medical problems listed below. ?Physiatrist and rehab team continue to assess barriers to discharge/monitor patient progress toward functional and medical goals ? ?Care Tool: ? ?Bathing ?   ?   ?   ?  ?   ?Bathing assist   ?  ?  ?Upper Body Dressing/Undressing ?Upper body dressing   ?  ?   ?Upper body assist   ?   ?Lower Body Dressing/Undressing ?Lower body dressing ? ? ?   ?  ? ?  ? ?Lower body assist   ?   ? ?Toileting ?Toileting    ?Toileting assist Assist for toileting: Contact Guard/Touching assist ?  ?  ?Transfers ?Chair/bed transfer ? ?Transfers assist ?   ? ?Chair/bed transfer assist level: Minimal Assistance - Patient > 75% ?  ?  ?Locomotion ?Ambulation ? ? ?Ambulation assist ? ?   ? ?  ?  ?   ? ?Walk 10 feet activity ? ? ?Assist ?   ? ?  ?   ? ?Walk 50 feet activity ? ? ?Assist   ? ?  ?   ? ? ?Walk 150 feet activity ? ? ?Assist   ? ?  ?  ?  ? ?Walk 10 feet on uneven surface  ?activity ? ? ?Assist   ? ? ?  ?   ? ?Wheelchair ? ? ? ? ?Assist   ?  ?  ? ?  ?   ? ? ?  Wheelchair 50 feet with 2 turns activity ? ? ? ?Assist ? ?  ?  ? ? ?   ? ?Wheelchair 150 feet activity  ? ? ? ?Assist ?   ? ? ?   ? ?Blood pressure (!) 144/88, pulse 65, temperature 97.7 ?F (36.5 ?C), resp. rate 18, height 6' (1.829 m), weight 96.9 kg, SpO2 96 %. ? ?Medical Problem List and Plan: ?1. 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            -patient may shower ?            -ELOS/Goals: 12 days Min A ?            -Admit to CIR PT, OT, add SLP for dysarthria eval  ?2.  Antithrombotics: ?-DVT/anticoagulation:  Pharmaceutical: Lovenox ?            -antiplatelet therapy: aspirin 81 mg daily and brilinta ?3. Pain Management: Tylenol prn ?4. Mood: LCSW to evaluate and provide emotional support ?            -antipsychotic agents: na ?5. Neuropsych: This patient is capable of making decisions on his own behalf. ?6. Skin/Wound Care: Routine skin care checks ?7. Fluids/Electrolytes/Nutrition: Routine Is and Os and follow-up chemistries ?8: Pontine stroke:  ?            Brilinta 90 mg twice daily plus aspirin 81 mg daily for 1 month ?            Then Plavix 75 mg daily  plus aspirin 81 mg daily for 2 months ?Then aspirin 81 mg daily only ?Continue statin ?30 day cardiac event monitor at discharge ?9: Hypertension: Continue amlodipine 2.5 mg daily. Holding hydralazine ?--home regimen: hydralazine 50 mg TID and amlodipine 10 mg daily ?            --Avoid low blood pressure given basilar artery stenosis ?            - BP has been a little elevated, monitor for now and consider increase of amlodipine ?Vitals:  ? 04/16/22 1943 04/17/22 0450  ?BP: (!) 156/86 (!) 144/88  ?Pulse: 83 65  ?Resp: 18 18  ?Temp: (!) 97.5 ?F (36.4 ?C) 97.7 ?F (36.5 ?C)  ?SpO2: 100% 96%  ? ? ?10: Graves disease s/p RAI/acquired hypothyroidism: continue Synthroid ?11: Prediabetes: follow-up PCP/[facility] ?           -Glucose appears in good range on BMP, follow ?12: Hyperlipidemia: continue Crestor 20 mg daily ?13: CKD 3a: elevated creatinine 1.26 (?? baseline); follow-up BMP ?14: Vitamin B12 deficiency: continue 1,000 mcg daily ?15. Constipation, senokot , sorbitol PRN ?            -Improved with BM today ?  ? ?LOS: ?1 days ?A FACE TO FACE EVALUATION WAS PERFORMED ? ?Luanna Salk Ashmi Blas ?04/17/2022, 8:13 AM  ? ? ? ?

## 2022-04-17 NOTE — Evaluation (Signed)
Occupational Therapy Assessment and Plan ? ?Patient Details  ?Name: Ivan Lawson ?MRN: [id number] ?Date of Birth: 11/30/1956 ? ?OT Diagnosis: ataxia, hemiplegia affecting dominant side, and muscle weakness (generalized) ?Rehab Potential: Rehab Potential (ACUTE ONLY): Excellent ?ELOS: 7-10 days  ? ?Today's Date: 04/17/2022 ?OT Individual Time: 8546-2703 ?OT Individual Time Calculation (min): 63 min    ? ?Hospital Problem: Principal Problem: ?  Left pontine cerebrovascular accident [facility]) ? ? ?Past Medical History:  ?Past Medical History:  ?Diagnosis Date  ? Hyperthyroidism   ? ?Past Surgical History: No past surgical history on file. ? ?Assessment & Plan ?Clinical Impression: Patient is a 66 y.o. year old male  who presented to the emergency department on 04/06/2022 complaining of new onset numbness to the fingers of his right hand as well as the toes of his right foot.  Symptoms progressed to include tingling of his right arm and numbness of his right lower leg below his knee.  He also reported unsteady gait.  On arrival to the emergency department his speech was clear his face was symmetric and there was no arm drift.  Neurology, Dr. Otelia Limes, was consulted.  CT of the head revealed a 9 mm low-density in the corona radiata on the right side.  Most likely etiology for his presentation was a subacute posterior fossa lacunar infarct versus a left thalamic lacunar infarct.  MRI revealed left pontine stroke.  His CTA was negative for thrombus.  There was less than 50% bilateral carotid artery stenosis.  Dual antiplatelet therapy.  Currently receiving Brilinta 90 mg daily with aspirin 81 mg daily for 1 month.  Then will switch to Plavix and aspirin for 2 months then aspirin alone. Crestor was added for hyperlipidemia.  He will need 30-day Holter monitor upon discharge. The patient requires inpatient medicine and rehabilitation evaluations and services for ongoing dysfunction secondary to pontine stroke. ?  ?Past medical  history significant for Graves' disease status post radioactive iodine ablation.  History of primary hypertension, chronic kidney disease stage IIIa, B12 deficiency and sinus bradycardia. ?  ?He reports RUE strength is improving each day. Dysarthria also improving.  Reports he had constipation but it improved after a BM today.  ?  ?He would like to get better so he can enjoy his hobby of fishing. .  Patient transferred to CIR on 04/16/2022 .   ? ?Patient currently requires min with basic self-care skills secondary to muscle weakness, impaired timing and sequencing, unbalanced muscle activation, ataxia, and decreased coordination, and decreased standing balance, hemiplegia, and decreased balance strategies.  Prior to hospitalization, patient could complete BADL/IADL/functional mobility with independent . ? ?Patient will benefit from skilled intervention to increase independence with basic self-care skills and increase level of independence with iADL prior to discharge home with care partner.  Anticipate patient will require intermittent supervision and follow up outpatient. ? ?OT - End of Session ?Activity Tolerance: Tolerates 30+ min activity with multiple rests ?Endurance Deficit: Yes ?Endurance Deficit Description: reports fatigue/increased effort after completing morning ADL ?OT Assessment ?Rehab Potential (ACUTE ONLY): Excellent ?OT Barriers to Discharge: Decreased caregiver support;Inaccessible home environment;Home environment access/layout ?OT Patient demonstrates impairments in the following area(s): Balance;Motor;Endurance ?OT Basic ADL's Functional Problem(s): Eating;Grooming;Bathing;Dressing;Toileting ?OT Advanced ADL's Functional Problem(s): Simple Meal Preparation ?OT Transfers Functional Problem(s): Toilet;Tub/Shower ?OT Additional Impairment(s): Fuctional Use of Upper Extremity ?OT Plan ?OT Intensity: Minimum of 1-2 x/day, 45 to 90 minutes ?OT Frequency: 5 out of 7 days ?OT Duration/Estimated Length of  Stay: 7-10 days ?OT Treatment/Interventions: Metallurgist  training;Disease mangement/prevention;Neuromuscular re-education;Self Care/advanced ADL retraining;Therapeutic Exercise;DME/adaptive equipment instruction;UE/LE Strength taining/ROM;UE/LE Coordination activities;Splinting/orthotics;Patient/family education;Functional electrical stimulation;Community reintegration;Discharge planning;Functional mobility training;Psychosocial support;Therapeutic Activities ?OT Self Feeding Anticipated Outcome(s): mod I ?OT Basic Self-Care Anticipated Outcome(s): mod I ?OT Toileting Anticipated Outcome(s): mod I ?OT Bathroom Transfers Anticipated Outcome(s): mod I ?OT Recommendation ?Recommendations for Other Services: Therapeutic Recreation consult ?Therapeutic Recreation Interventions: Pet therapy;Kitchen group;Stress management ?Patient destination: Home ?Follow Up Recommendations: Outpatient OT ?Equipment Recommended: To be determined ? ? ?OT Evaluation ?Precautions/Restrictions  ?Precautions ?Precautions: Fall ?Precaution Comments: mild R HP and ataxia ?Restrictions ?Weight Bearing Restrictions: No ?General ?Chart Reviewed: Yes ?Response to Previous Treatment: Not applicable ?Family/Caregiver Present: No ?Pain denies ?  ?Home Living/Prior Functioning ?Home Living ?Available Help at Discharge: Family, Available 24 hours/day (Brother can help at DC) ?Type of Home: House ?Home Access: Stairs to enter ?Entrance Stairs-Number of Steps: 6 ?Entrance Stairs-Rails: Right, Left ?Home Layout: One level ?Bathroom Shower/Tub: Tub/shower unit, Walk-in shower ?Bathroom Toilet: Standard ?Bathroom Accessibility: Yes ? Lives With: Alone ?IADL History ?Homemaking Responsibilities: Yes ?Meal Prep Responsibility: Primary ?Laundry Responsibility: Primary ?Cleaning Responsibility: Primary ?Bill Paying/Finance Responsibility: Primary ?Shopping Responsibility: Primary ?Child Care Responsibility: No ?Occupation: Retired ?Type of Occupation:  Music therapist retired ?Leisure and Hobbies: yard work, working on cars, fish, gardener ?Prior Function ?Level of Independence: Independent with basic ADLs ? Able to Take Stairs?: Yes ?Driving: Yes ?Vision ?Baseline Vision/History: 1 Wears glasses (readers) ?Ability to See in Adequate Light: 0 Adequate ?Patient Visual Report: No change from baseline ?Vision Assessment?: No apparent visual deficits ?Perception  ?Perception: Within Functional Limits ?Praxis ?Praxis: Intact ?Cognition ?Cognition ?Overall Cognitive Status: Within Functional Limits for tasks assessed ?Arousal/Alertness: Awake/alert ?Orientation Level: Person;Place;Situation ?Person: Oriented ?Place: Oriented ?Situation: Oriented ?Memory: Appears intact ?Attention: Sustained ?Sustained Attention: Appears intact ?Problem Solving: Appears intact ?Safety/Judgment: Appears intact ?Comments: Reports some ?Brief Interview for Mental Status (BIMS) ?Repetition of Three Words (First Attempt): 3 ?Temporal Orientation: Year: Correct ?Temporal Orientation: Month: Accurate within 5 days ?Temporal Orientation: Day: Correct ?Recall: "Sock": Yes, no cue required ?Recall: "Blue": Yes, no cue required ?Recall: "Bed": Yes, no cue required ?BIMS Summary Score: 15 ?Sensation ?Sensation ?Light Touch: Impaired Detail ?Peripheral sensation comments: R thumb/toe numbness ?Light Touch Impaired Details: Impaired RUE;Impaired RLE ?Hot/Cold: Appears Intact ?Proprioception: Appears Intact ?Stereognosis: Appears Intact ?Coordination ?Gross Motor Movements are Fluid and Coordinated: No ?Fine Motor Movements are Fluid and Coordinated: No ?Coordination and Movement Description: mild R HP and ataxia ?Finger Nose Finger Test: mild ataxia on R ?Motor  ?Motor ?Motor: Ataxia;Hemiplegia ?Motor - Skilled Clinical Observations: mild R HP and ataxia  ?Trunk/Postural Assessment  ?Cervical Assessment ?Cervical Assessment: Within Functional Limits ?Thoracic Assessment ?Thoracic Assessment: Exceptions to  [facility] (midly rounded shoulders) ?Lumbar Assessment ?Lumbar Assessment: Within Functional Limits ?Postural Control ?Postural Control: Within Functional Limits  ?Balance ?Balance ?Balance Assessed: Yes ?Static S

## 2022-04-17 NOTE — Progress Notes (Signed)
Inpatient Rehabilitation  Patient information reviewed and entered into eRehab system by Natika Geyer M. Fitzgerald Dunne, M.A., CCC/SLP, PPS Coordinator.  Information including medical coding, functional ability and quality indicators will be reviewed and updated through discharge.    

## 2022-04-17 NOTE — Evaluation (Signed)
Physical Therapy Assessment and Plan ? ?Patient Details  ?Name: Ivan Lawson ?MRN: [id number] ?Date of Birth: 02/19/1956 ? ?PT Diagnosis: Ataxia, Coordination disorder, Difficulty walking, Hemiparesis dominant, Impaired sensation, and Muscle weakness ?Rehab Potential: Good ?ELOS: 10-12 days  ? ?Today's Date: 04/17/2022 ?PT Individual Time: 9417-4081 and [id number] ?PT Individual Time Calculation (min): 62 min and 78 min ? ?Hospital Problem: Principal Problem: ?  Left pontine cerebrovascular accident [facility]) ? ? ?Past Medical History:  ?Past Medical History:  ?Diagnosis Date  ? Hyperthyroidism   ? ?Past Surgical History: No past surgical history on file. ? ?Assessment & Plan ?Clinical Impression: Patient is a 66 y.o. male who presented to the emergency department on 04/06/2022 complaining of new onset numbness to the fingers of his right hand as well as the toes of his right foot.  Symptoms progressed to include tingling of his right arm and numbness of his right lower leg below his knee.  He also reported unsteady gait.  On arrival to the emergency department his speech was clear his face was symmetric and there was no arm drift.  Neurology, Dr. Otelia Limes, was consulted.  CT of the head revealed a 9 mm low-density in the corona radiata on the right side.  Most likely etiology for his presentation was a subacute posterior fossa lacunar infarct versus a left thalamic lacunar infarct.  MRI revealed left pontine stroke.  His CTA was negative for thrombus.  There was less than 50% bilateral carotid artery stenosis.  Dual antiplatelet therapy.  Currently receiving Brilinta 90 mg daily with aspirin 81 mg daily for 1 month.  Then will switch to Plavix and aspirin for 2 months then aspirin alone. Crestor was added for hyperlipidemia.  He will need 30-day Holter monitor upon discharge. The patient requires inpatient medicine and rehabilitation evaluations and services for ongoing dysfunction secondary to pontine stroke. Patient  transferred to CIR on 04/16/2022 .  ? ?Past medical history significant for Graves' disease status post radioactive iodine ablation.  History of primary hypertension, chronic kidney disease stage IIIa, B12 deficiency and sinus bradycardia. ? ?Patient currently requires up to min assist with mobility secondary to muscle weakness, decreased cardiorespiratoy endurance, unbalanced muscle activation and ataxia, decreased attention to right and decreased motor planning, decreased awareness, decreased problem solving, decreased safety awareness, decreased memory, and delayed processing, and decreased standing balance, decreased balance strategies, and hemipareisis .  Prior to hospitalization, patient was independent  with mobility and lived with Alone in a House.  Brother lives next door. Home access is 5Stairs to enter. ? ?Patient will benefit from skilled PT intervention to maximize safe functional mobility, minimize fall risk, and decrease caregiver burden for planned discharge home with intermittent assist.  Anticipate patient will benefit from follow up OP at discharge. ? ?PT - End of Session ?Activity Tolerance: Tolerates 30+ min activity with multiple rests ?Endurance Deficit: Yes ?Endurance Deficit Description: reports fatigue/increased effort after completing ADLs, during ambulation, and during outcome measure testing ?PT Assessment ?Rehab Potential (ACUTE/IP ONLY): Good ?PT Barriers to Discharge: Inaccessible home environment;Decreased caregiver support;Home environment access/layout;Lack of/limited family support;Insurance for SNF coverage ?PT Patient demonstrates impairments in the following area(s): Balance;Endurance;Motor;Safety;Sensory ?PT Transfers Functional Problem(s): Bed Mobility;Bed to Chair;Car;Furniture;Floor ?PT Locomotion Functional Problem(s): Ambulation;Stairs ?PT Plan ?PT Intensity: Minimum of 1-2 x/day ,45 to 90 minutes ?PT Frequency: 5 out of 7 days ?PT Duration Estimated Length of Stay: 10-12  days ?PT Treatment/Interventions: Ambulation/gait training;Community reintegration;DME/adaptive equipment instruction;Neuromuscular re-education;Psychosocial support;Stair training;UE/LE Strength taining/ROM;Balance/vestibular training;Discharge planning;Therapeutic Activities;UE/LE Coordination activities;Visual/perceptual  remediation/compensation;Therapeutic Exercise;Splinting/orthotics;Patient/family education;Functional mobility training;Disease management/prevention;Cognitive remediation/compensation ?PT Transfers Anticipated Outcome(s): Mod I ?PT Locomotion Anticipated Outcome(s): Mod I/ supervision ?PT Recommendation ?Recommendations for Other Services: Speech consult;Therapeutic Recreation consult ?Therapeutic Recreation Interventions: Pet therapy;Kitchen group;Stress management;Outing/community reintergration ?Follow Up Recommendations: Outpatient PT ?Patient destination: Home ?Equipment Recommended: To be determined ? ? ?PT Evaluation ?Precautions/Restrictions ?Precautions ?Precautions: Fall ?Precaution Comments: R hemipareisis UE>LE, very mild ataxia ?Restrictions ?Weight Bearing Restrictions: No ?General ?  Vital Signs ?Pain ?Pain Assessment ?Pain Scale: 0-10 ?Pain Score: 0-No pain ?Pain Interference ?Pain Interference ?Pain Effect on Sleep: 1. Rarely or not at all ?Pain Interference with Therapy Activities: 1. Rarely or not at all ?Pain Interference with Day-to-Day Activities: 1. Rarely or not at all ?Home Living/Prior Functioning ?Home Living ?Available Help at Discharge: Family;Available 24 hours/day (Brother can help at DC) ?Type of Home: House ?Home Access: Stairs to enter ?Entrance Stairs-Number of Steps: 5 ?Entrance Stairs-Rails: Right;Left ?Home Layout: One level ?Bathroom Shower/Tub: Tub/shower unit;Walk-in shower ?Bathroom Toilet: Standard ?Bathroom Accessibility: Yes ?Additional Comments: Brother can provide support ? Lives With: Alone ?Prior Function ?Level of Independence: Independent with  basic ADLs;Independent with homemaking with ambulation;Independent with gait;Independent with transfers ? Able to Take Stairs?: Reciprically ?Driving: Yes ?Vocation: Retired TEFL teacher) ?Leisure: Hobbies-yes (Comment) (loves to go fishing; has a home at [place]) ?Vision/Perception  ?Vision - History ?Ability to See in Adequate Light: 0 Adequate ?Perception ?Perception: Within Functional Limits ?Praxis ?Praxis: Intact  ?Cognition ?Overall Cognitive Status: Within Functional Limits for tasks assessed ?Arousal/Alertness: Awake/alert ?Orientation Level: Oriented X4 ?Attention: Focused;Divided ?Focused Attention: Impaired ?Sustained Attention: Appears intact ?Divided Attention: Impaired ?Memory: Appears intact ?Awareness: Appears intact ?Problem Solving: Appears intact (slow but present) ?Safety/Judgment: Appears intact ?Comments: Reports some ?Sensation ?Sensation ?Light Touch: Impaired by gross assessment ?Peripheral sensation comments: R thumb/toe numbness/ tingling ?Light Touch Impaired Details: Impaired RUE;Impaired RLE ?Coordination ?Gross Motor Movements are Fluid and Coordinated: No ?Fine Motor Movements are Fluid and Coordinated: No ?Coordination and Movement Description: R hemipareisis UE>LE, very mild ataxia ?Heel Shin Test: [facility] bilaterally, although performs with RLE when asked for LLE ?Motor  ?Motor ?Motor: Ataxia;Other (comment) (hemipareisis) ?Motor - Skilled Clinical Observations: R hemipareisis and mild ataxia  ? ?Trunk/Postural Assessment  ?Cervical Assessment ?Cervical Assessment: Within Functional Limits ?Thoracic Assessment ?Thoracic Assessment: Exceptions to [facility] (midly rounded shoulders) ?Lumbar Assessment ?Lumbar Assessment: Within Functional Limits ?Postural Control ?Postural Control: Within Functional Limits  ?Balance ?Balance ?Balance Assessed: Yes ?Standardized Balance Assessment ?Standardized Balance Assessment: Berg Balance Test;Timed Up and Go Test (5xSTS = avg 18.51sec) ?Berg Balance  Test ?Sit to Stand: Able to stand without using hands and stabilize independently ?Standing Unsupported: Able to stand 2 minutes with supervision ?Sitting with Back Unsupported but Feet Supported on Floor or S

## 2022-04-17 NOTE — Plan of Care (Signed)
?  Problem: RH Furniture Transfers ?Goal: LTG Patient will perform furniture transfers w/assist (OT/PT) ?Description: LTG: Patient will perform furniture transfers  with assistance (OT/PT). ?Flowsheets (Taken 04/17/2022 1911) ?LTG: Pt will perform furniture transfers with assist:: Independent ?  ?Problem: RH Balance ?Goal: LTG Patient will maintain dynamic standing balance (PT) ?Description: LTG:  Patient will maintain dynamic standing balance with assistance during mobility activities (PT) ?Flowsheets (Taken 04/17/2022 1911) ?LTG: Pt will maintain dynamic standing balance during mobility activities with:: Independent with assistive device  ?  ?Problem: Sit to Stand ?Goal: LTG:  Patient will perform sit to stand with assistance level (PT) ?Description: LTG:  Patient will perform sit to stand with assistance level (PT) ?Flowsheets (Taken 04/17/2022 1911) ?LTG: PT will perform sit to stand in preparation for functional mobility with assistance level: Independent ?  ?Problem: RH Bed Mobility ?Goal: LTG Patient will perform bed mobility with assist (PT) ?Description: LTG: Patient will perform bed mobility with assistance, with/without cues (PT). ?Flowsheets (Taken 04/17/2022 1911) ?LTG: Pt will perform bed mobility with assistance level of: Independent ?  ?Problem: RH Bed to Chair Transfers ?Goal: LTG Patient will perform bed/chair transfers w/assist (PT) ?Description: LTG: Patient will perform bed to chair transfers with assistance (PT). ?Flowsheets (Taken 04/17/2022 1911) ?LTG: Pt will perform Bed to Chair Transfers with assistance level: Independent ?  ?Problem: RH Car Transfers ?Goal: LTG Patient will perform car transfers with assist (PT) ?Description: LTG: Patient will perform car transfers with assistance (PT). ?Flowsheets (Taken 04/17/2022 1911) ?LTG: Pt will perform car transfers with assist:: Supervision/Verbal cueing ?  ?Problem: RH Floor Transfers ?Goal: LTG Patient will perform floor transfers w/assist  (PT) ?Description: LTG: Patient will perform floor transfers with assistance (PT). ?Flowsheets (Taken 04/17/2022 1911) ?LTG: PT WILL PERFORM FLOOR TRANFERS  WITH  ASSIST:: Independent with assistive device  ?  ?Problem: RH Ambulation ?Goal: LTG Patient will ambulate in home environment (PT) ?Description: LTG: Patient will ambulate in home environment, # of feet with assistance (PT). ?Flowsheets (Taken 04/17/2022 1911) ?LTG: Pt will ambulate in home environ  assist needed:: Independent ?LTG: Ambulation distance in home environment: at least 100 ft with no AD ?Goal: LTG Patient will ambulate in community environment (PT) ?Description: LTG: Patient will ambulate in community environment, # of feet with assistance (PT). ?Flowsheets (Taken 04/17/2022 1911) ?LTG: Pt will ambulate in community environ  assist needed:: Supervision/Verbal cueing ?LTG: Ambulation distance in community environment: >525ft using LRAD ?  ?Problem: RH Stairs ?Goal: LTG Patient will ambulate up and down stairs w/assist (PT) ?Description: LTG: Patient will ambulate up and down # of stairs with assistance (PT) ?Flowsheets (Taken 04/17/2022 1911) ?LTG: Pt will ambulate up/down stairs assist needed:: Independent with assistive device ?LTG: Pt will  ambulate up and down number of stairs: at least 5 steps with HR setup as per home environment ?  ?

## 2022-04-17 NOTE — Patient Care Conference (Signed)
Inpatient RehabilitationTeam Conference and Plan of Care Update Date: 04/17/2022   Time: 10:11 AM    Patient Name: Ivan Lawson      Medical Record Number: [id number]  Date of Birth: June 05, 1956 Sex: Male         Room/Bed: 4M09C/4M09C-01 Payor Info: Payor: [employer] / Plan: [employer] PPO / Product Type: *No Product type* /    Admit Date/Time:  04/16/2022  2:54 PM  Primary Diagnosis:  Left pontine cerebrovascular accident [facility])  Hospital Problems: Principal Problem:   Left pontine cerebrovascular accident [facility])    Expected Discharge Date: Expected Discharge Date:  (evals pending)  Team Members Present: Physician leading conference: Dr. Alysia Penna Social Worker Present: Loralee Pacas, Grady Nurse Present: Dorien Chihuahua, RN PT Present: Barrie Folk, PT OT Present: Providence Lanius, OT SLP Present: Sherren Kerns, SLP PPS Coordinator present : Gunnar Fusi, SLP     Current Status/Progress Goal Weekly Team Focus  Bowel/Bladder   continent to bowel and bladder LBM 5/15  remain continent      Swallow/Nutrition/ Hydration             ADL's   eval pending  eval pending  eval pending   Mobility   Eval pending  Eval pending  Eval pending.   Communication             Safety/Cognition/ Behavioral Observations            Pain   denies pain  denies pain      Skin   skin intact  wound to show signs of healing        Discharge Planning:      Team Discussion: Patient with right hemiparesis  and ataxia post pons CVA.  Patient on target to meet rehab goals: Evals pending; currently CGA for lower body care and toileting. Goals for discharge set for mod I overall.  *See Care Plan and progress notes for long and short-term goals.   Revisions to Treatment Plan:  SLP for dysarthria eval   Teaching Needs: SAfety, transfers, medications, secondary risk management, etc  Current Barriers to Discharge: Decreased caregiver support and Home  enviroment access/layout  Possible Resolutions to Barriers: Family education     Medical Summary Current Status: mild Rigth hemiparesis, + constipation , +HOH  Barriers to Discharge: Medical stability   Possible Resolutions to Barriers/Weekly Focus: HTN management, bowel constipation, laxative adjustment   Continued Need for Acute Rehabilitation Level of Care: The patient requires daily medical management by a physician with specialized training in physical medicine and rehabilitation for the following reasons: Direction of a multidisciplinary physical rehabilitation program to maximize functional independence : Yes Medical management of patient stability for increased activity during participation in an intensive rehabilitation regime.: Yes Analysis of laboratory values and/or radiology reports with any subsequent need for medication adjustment and/or medical intervention. : Yes   I attest that I was present, lead the team conference, and concur with the assessment and plan of the team.   [name] 04/17/2022, 2:52 PM

## 2022-04-18 DIAGNOSIS — I639 Cerebral infarction, unspecified: Secondary | ICD-10-CM | POA: Diagnosis not present

## 2022-04-18 MED ORDER — SENNOSIDES-DOCUSATE SODIUM 8.6-50 MG PO TABS
2.0000 | ORAL_TABLET | Freq: Two times a day (BID) | ORAL | Status: DC
  Administered 2022-04-18 – 2022-04-24 (×11): 2 via ORAL
  Administered 2022-04-24: 1 via ORAL
  Administered 2022-04-25: 2 via ORAL
  Filled 2022-04-18 (×15): qty 2

## 2022-04-18 NOTE — Progress Notes (Signed)
Physical Therapy Session Note  Patient Details  Name: Ivan Lawson MRN: [id number] Date of Birth: 01-Jun-1956  Today's Date: 04/18/2022 PT Individual Time: 1300-1400 PT Individual Time Calculation (min): 60 min   Short Term Goals: Week 1:  PT Short Term Goal 1 (Week 1): Pt will perform bed mobility with consistent supervision/ IND and no use of bed features. PT Short Term Goal 2 (Week 1): Pt will perform all functional transfers with no AD and consistent supervision. PT Short Term Goal 3 (Week 1): Pt will ambulate >300 ft with no AD and consistent supervision and decreased need for rest breaks. PT Short Term Goal 4 (Week 1): Pt will complete at least 6 steps with supervision and no cueing for proper technique. PT Short Term Goal 5 (Week 1): Pt will improve score on TUG by at least 5 seconds to demonstrate improved fall risk.  Skilled Therapeutic Interventions/Progress Updates:    Session focused heavily on NMR to address postural control retraining, higher level balance, RUE/RLE functional strengthening and coordination through a variety of functional activities.   On compliant surface, performed static balance and progressed to dynamic standing balance for functional reaching tasks with RUE including various heights and forcing low squat with min assist needed. Pt also performing picking up items off of floor with RUE with min assist.  Retro gait and lateral sidestepping (R <> L) with CGA and focus on controlled stepping pattern of RLE with sidestepping. Noted narrow BOS and decerased step length during retro gait with pt with a few instances of min LOB's noted.  Standing heel raises with mirror for visual feedback (initially without mirror, pt with decreased body awareness with knees flexing forward and decreased trunk control to compensate) but improvement noted with mirror with technique. Pt does demonstrate decreased activation on R. Attempted without UE support but ultimately requires  light UE support for improved quality of exercise. Difficulty with toe raises due to decreased DF on R but pt attempted.  Biodex to continue balance retraining with limits of stability and random control programs initially on noncompliant surface, progressing to compliant surface on level 10. Pt attempts to perform majority of tasks without UE support but does require up to mod assist and UE support at times to catch balance. Demonstrates increased difficulty with R posterior weightshifts as well as L lateral weightshifts. Seated rest breaks between sets needed due to decreased endurance. Requires min assist for up/down step onto the platform  Gait on unit and between therapy gyms/patient room with overall CGA without AD with focus on R foot clearance and knee/hip control, functional balance in busy environment and with multiple obstacles, and upright posture.  Blocked practice curb step negotiation with overall min assist - pt set on doing it the "right way" with leading up with L foot and down with R despite therapist stating due to his strength, could go either way that one way may just feel more unsteady or more challenging. Therefore, performed several repetitions about 10 times overall.   Therapy Documentation Precautions:  Precautions Precautions: Fall Precaution Comments: R hemipareisis UE>LE, very mild ataxia Restrictions Weight Bearing Restrictions: No  Pain: Reports some L hip soreness but no intervention needed at this time.    Therapy/Group: Individual Therapy  Karolee Stamps Darrol Poke, PT, DPT, CBIS  04/18/2022, 2:24 PM

## 2022-04-18 NOTE — Progress Notes (Signed)
Occupational Therapy Session Note  Patient Details  Name: Ivan Lawson MRN: [id number] Date of Birth: [birth year]  Today's Date: 04/18/2022 OT Individual Time: 1430-1500 OT Individual Time Calculation (min): 30 min    Short Term Goals: Week 1:  OT Short Term Goal 1 (Week 1): STG = LTG 2/2 ELOS  Skilled Therapeutic Interventions/Progress Updates:    1:1 Pt received in the recliner. Pt reported fatigue from last session - decided to ambulate to and from the gym with RW for energy conservation. Pt able to ambulate with contact guard with RW. In the gym performed floor transfer with contact guard and got into quadraped position. Able to tolerate and sustain his balance. Challenged with extending out contralateral limbs and sustained position for a 10 count. Pt with more difficulty with lifting and extended out right UE and left LE due to instability in right hip. Transitioned into tall kneeling at the mat to work on [facility] with small blocks with right hand. Remained in tall kneeing and worked to lift different weighted balls next to him on the mat and lifted them onto a table top on his right (crossing over midline bilaterally). Pt able to get into standing and then transition to the mat with contact guard.   Ambulated back to the room with contact guard with RW. Left in recliner with safety measures in place.   Therapy Documentation Precautions:  Precautions Precautions: Fall Precaution Comments: R hemipareisis UE>LE, very mild ataxia Restrictions Weight Bearing Restrictions: No  Pain:  No c/o pain in session   Therapy/Group: Individual Therapy  Willeen [facility] 04/18/2022, 3:17 PM

## 2022-04-18 NOTE — Progress Notes (Signed)
PROGRESS NOTE   Subjective/Complaints:  No issues overnite , slept better, still constipated , discussed BP meds   ROS- neg CP, SOB, N/V/D  Objective:   No results found. Recent Labs    04/17/22 0624  WBC 7.7  HGB 15.4  HCT 44.4  PLT 214    Recent Labs    04/17/22 0624  NA 137  K 4.6  CL 106  CO2 25  GLUCOSE 95  BUN 19  CREATININE 1.38*  CALCIUM 9.2     Intake/Output Summary (Last 24 hours) at 04/18/2022 0734 Last data filed at 04/18/2022 0600 Gross per 24 hour  Intake 615 ml  Output 651 ml  Net -36 ml         Physical Exam: Vital Signs Blood pressure 122/86, pulse (!) 58, temperature (!) 97.5 F (36.4 C), resp. rate 16, height 6' (1.829 m), weight 96.9 kg, SpO2 96 %.   General: No acute distress Mood and affect are appropriate Heart: Regular rate and rhythm no rubs murmurs or extra sounds Lungs: Clear to auscultation, breathing unlabored, no rales or wheezes Abdomen: Positive bowel sounds, soft nontender to palpation, nondistended Extremities: No clubbing, cyanosis, or edema Skin: No evidence of breakdown, no evidence of rash Neurologic: Cranial nerves II through XII intact, motor strength is 5/5 in left and 4/5 Right  deltoid, bicep, tricep, grip, hip flexor, knee extensors, ankle dorsiflexor and plantar flexor Sensory exam normal sensation to light touch and proprioception in bilateral upper and lower extremities  Musculoskeletal: Full range of motion in all 4 extremities. No joint swelling   Assessment/Plan: 1. Functional deficits which require 3+ hours per day of interdisciplinary therapy in a comprehensive inpatient rehab setting. Physiatrist is providing close team supervision and 24 hour management of active medical problems listed below. Physiatrist and rehab team continue to assess barriers to discharge/monitor patient progress toward functional and medical goals  Care  Tool:  Bathing  Bathing activity did not occur: Refused           Bathing assist       Upper Body Dressing/Undressing Upper body dressing   What is the patient wearing?: Pull over shirt    Upper body assist Assist Level: Supervision/Verbal cueing    Lower Body Dressing/Undressing Lower body dressing      What is the patient wearing?: Underwear/pull up     Lower body assist Assist for lower body dressing: Minimal Assistance - Patient > 75%     Toileting Toileting    Toileting assist Assist for toileting: Minimal Assistance - Patient > 75%     Transfers Chair/bed transfer  Transfers assist     Chair/bed transfer assist level: Minimal Assistance - Patient > 75%     Locomotion Ambulation   Ambulation assist      Assist level: Minimal Assistance - Patient > 75% Assistive device: No Device Max distance: >200 ft   Walk 10 feet activity   Assist     Assist level: Supervision/Verbal cueing Assistive device: No Device   Walk 50 feet activity   Assist    Assist level: Minimal Assistance - Patient > 75% Assistive device: No Device    Walk 150  feet activity   Assist    Assist level: Contact Guard/Touching assist Assistive device: No Device    Walk 10 feet on uneven surface  activity   Assist     Assist level: Minimal Assistance - Patient > 75% Assistive device: Other (comment) (no device)   Wheelchair     Assist Is the patient using a wheelchair?: No   Wheelchair activity did not occur: N/A         Wheelchair 50 feet with 2 turns activity    Assist    Wheelchair 50 feet with 2 turns activity did not occur: N/A       Wheelchair 150 feet activity     Assist  Wheelchair 150 feet activity did not occur: N/A       Blood pressure 122/86, pulse (!) 58, temperature (!) 97.5 F (36.4 C), resp. rate 16, height 6' (1.829 m), weight 96.9 kg, SpO2 96 %.  Medical Problem List and Plan: 1. Functional deficits  secondary to extension of left paramedian pontine infarct likely secondary to basilar artery stenosis             -patient may shower             -ELOS/Goals: 12 days Min A             -Admit to CIR PT, OT, add SLP for dysarthria eval  2.  Antithrombotics: -DVT/anticoagulation:  Pharmaceutical: Lovenox             -antiplatelet therapy: aspirin 81 mg daily and brilinta 3. Pain Management: Tylenol prn 4. Mood: LCSW to evaluate and provide emotional support             -antipsychotic agents: na 5. Neuropsych: This patient is capable of making decisions on his own behalf. 6. Skin/Wound Care: Routine skin care checks 7. Fluids/Electrolytes/Nutrition: Routine Is and Os and follow-up chemistries 8: Pontine stroke:              Brilinta 90 mg twice daily plus aspirin 81 mg daily for 1 month             Then Plavix 75 mg daily plus aspirin 81 mg daily for 2 months Then aspirin 81 mg daily only Continue statin 30 day cardiac event monitor at discharge 9: Hypertension: Continue amlodipine 2.5 mg daily. Holding hydralazine --home regimen: hydralazine 50 mg TID and amlodipine 10 mg daily             --Avoid low blood pressure given basilar artery stenosis             - BP has been aessentially normal off meds will resume as needed starting with amlodipine  Vitals:   04/17/22 1929 04/18/22 0631  BP: (!) 141/88 122/86  Pulse: 69 (!) 58  Resp: 18 16  Temp: 98.2 F (36.8 C) (!) 97.5 F (36.4 C)  SpO2: 100% 96%    10: Graves disease s/p RAI/acquired hypothyroidism: continue Synthroid 11: Prediabetes: follow-up PCP/[facility]            -Glucose appears in good range on BMP, follow 12: Hyperlipidemia: continue Crestor 20 mg daily 13: CKD 3a: elevated creatinine 1.26 (?? baseline); follow-up BMP    Latest Ref Rng & Units 04/17/2022    6:24 AM 04/13/2022    1:03 AM 04/12/2022    8:20 AM  BMP  Glucose 70 - 99 mg/dL 95   93     BUN 8 - 23 mg/dL 19   15  Creatinine 0.61 - 1.24  mg/dL 1.38   1.26   1.27    Sodium 135 - 145 mmol/L 137   136     Potassium 3.5 - 5.1 mmol/L 4.6   3.7     Chloride 98 - 111 mmol/L 106   110     CO2 22 - 32 mmol/L 25   19     Calcium 8.9 - 10.3 mg/dL 9.2   8.4       14: Vitamin B12 deficiency: continue 1,000 mcg daily 15. Constipation, senokot , sorbitol PRN             -Improved with BM today    LOS: 2 days A FACE TO FACE EVALUATION WAS PERFORMED  Charlett Blake 04/18/2022, 7:34 AM

## 2022-04-18 NOTE — Progress Notes (Signed)
Inpatient Rehabilitation Care Coordinator Assessment and Plan Patient Details  Name: Ivan Lawson MRN: [id number] Date of Birth: 14-Jun-1956  Today's Date: 04/18/2022  Hospital Problems: Principal Problem:   Left pontine cerebrovascular accident [facility])  Past Medical History:  Past Medical History:  Diagnosis Date   Hyperthyroidism    Past Surgical History: No past surgical history on file. Social History:  reports that he has never smoked. He has never used smokeless tobacco. He reports that he does not currently use alcohol. He reports that he does not currently use drugs.  Family / Support Systems Marital Status: Single Children: Sam (Brother) Anticipated Caregiver: Sam Fata Ability/Limitations of Caregiver: Min A Caregiver Availability: 24/7 Family Dynamics: support from brother  Social History Preferred language: English Religion: Non-Denominational Health Literacy - How often do you need to have someone help you when you read instructions, pamphlets, or other written material from your doctor or pharmacy?: Never Legal History/Current Legal Issues: n/a Guardian/Conservator: Sam   Abuse/Neglect Abuse/Neglect Assessment Can Be Completed: Yes Physical Abuse: Denies Verbal Abuse: Denies Sexual Abuse: Denies Exploitation of patient/patient's resources: Denies Self-Neglect: Denies  Patient response to: Social Isolation - How often do you feel lonely or isolated from those around you?: Never  Emotional Status Recent Psychosocial Issues: coping Psychiatric History: n/a Substance Abuse History: n/a  Patient / Family Perceptions, Expectations & Goals Pt/Family understanding of illness & functional limitations: yes Premorbid pt/family roles/activities: independent and active in the community Anticipated changes in roles/activities/participation: assistance from brother able to provide Min A Pt/family expectations/goals: Min A  [employer]: None Premorbid Home Care/DME Agencies: Other (Comment) Manufacturing systems engineer, Biomedical scientist) Transportation available at discharge: brother able to transport Is the patient able to respond to transportation needs?: Yes In the past 12 months, has lack of transportation kept you from medical appointments or from getting medications?: No In the past 12 months, has lack of transportation kept you from meetings, work, or from getting things needed for daily living?: No Resource referrals recommended: Neuropsychology  Discharge Planning Living Arrangements: Other relatives Support Systems: Other relatives Type of Residence: Private residence Insurance Resources: Media planner (specify) (HTA) Financial Resources: Family Support Financial Screen Referred: No Living Expenses: Lives with family Money Management: Patient Does the patient have any problems obtaining your medications?: No Home Management: Independent Patient/Family Preliminary Plans: patient or brother able to assist if needed Care Coordinator Barriers to Discharge: Insurance for SNF coverage, Decreased caregiver support, Lack of/limited family support Care Coordinator Anticipated Follow Up Needs: HH/OP Expected length of stay: 12 Days  Clinical Impression SW made attempt to see patient currently participating in therapies. Sw will continue to follow up with patient and family   Andria Rhein 04/18/2022, 12:20 PM

## 2022-04-18 NOTE — Evaluation (Signed)
Speech Language Pathology Assessment and Plan  Patient Details  Name: Ivan Lawson MRN: [id number] Date of Birth: 1956-08-16  SLP Diagnosis: Dysarthria   Today's Date: 04/18/2022 SLP Individual Time: 1100-1200 SLP Individual Time Calculation (min): 60 min  Hospital Problem: Principal Problem:   Left pontine cerebrovascular accident [facility])  Past Medical History:  Past Medical History:  Diagnosis Date   Hyperthyroidism    Past Surgical History: No past surgical history on file.  Assessment / Plan / Recommendation Clinical Impression Ivan Lawson is a 66 year old male who presented to the emergency department on 04/06/2022 complaining of new onset numbness to the fingers of his right hand as well as the toes of his right foot. Symptoms progressed to include tingling of his right arm and numbness of his right lower leg below his knee.  He also reported unsteady gait.  On arrival to the emergency department his speech was clear his face was symmetric and there was no arm drift.  Neurology, Dr. Cheral Marker, was consulted.  CT of the head revealed a 9 mm low-density in the corona radiata on the right side.  Most likely etiology for his presentation was a subacute posterior fossa lacunar infarct versus a left thalamic lacunar infarct.  MRI revealed left pontine stroke. The patient requires inpatient medicine and rehabilitation evaluations and services for ongoing dysfunction secondary to pontine stroke.  Past medical history significant for Graves' disease status post radioactive iodine ablation. History of primary hypertension, chronic kidney disease stage IIIa, B12 deficiency and sinus bradycardia.  Pt presents with a slight dysarthria impacting quality of articulation however minimal impact on speech intelligibility. Pt reports significant improvement to motor speech following CVA and feels his speech is 95% back to baseline. Oral mechanism exam significant for mildly reduced ROM on right side of  lower face. This did not appear to negatively impact oral containment of secretions. SLP provided education on speech intelligibility strategies using SLOP acronym (slow, loud, over articulate, pause between words). Pt verbalized and demonstrated strategies at the conversation level with independence to achieve 100% speech intelligibility. Considering pt's current speech is perceived as 100% intelligible, pt is independent with implementing speech strategies, and pt feels current speech has nearly returned to baseline, SLP is not recommending skilled intervention at this time. Pt in agreement.  Cognitive-linguistic skills appear WFL for all tasks assessed. SLP administered the [facility] Mental Status Examination (SLUMS) and patient scored 30/30 points with a score of 27 or above considered normal. Pt demonstrated appropriate awareness of deficits and anticipatory problem solving skills. Patient's overall auditory comprehension and verbal expression appeared [facility] for all tasks assessed. Pt benefited from occasional verbal repetition and additional processing time for higher level problem solving and comprehension, however suspect this may attribute to hearing loss. Pt is tolerating a regular/thin diet and denied acute changes to swallow function. Pt denies any noticeable changes to cognitive s/p CVA. Skilled SLP intervention is not recommended from a cognitive standpoint. Pt in agreement. ST to sign off at this time.   Skilled Therapeutic Interventions           Pt participated in [place] Status Examination (SLUMS) as well as further non-standardized assessments of cognitive-linguistic, speech, and language function. Please see above.             Prior Functioning Cognitive/Linguistic Baseline: Within functional limits Type of Home: House  Lives With: Alone Available Help at Discharge: Family;Available 24 hours/day;Other (Comment) Education: [employer] Vocation:  Retired  SLP  Evaluation Cognition Overall Cognitive Status: Within Functional Limits for tasks assessed Arousal/Alertness: Awake/alert Orientation Level: Oriented X4 Year: 2023 Month: May Day of Week: Correct Attention: Alternating Sustained Attention: Appears intact Alternating Attention: Impaired Alternating Attention Impairment: Verbal complex Memory: Appears intact Awareness: Appears intact Problem Solving: Appears intact Safety/Judgment: Appears intact  Comprehension Auditory Comprehension Overall Auditory Comprehension: Appears within functional limits for tasks assessed Interfering Components: Hearing;Processing speed Expression Expression Primary Mode of Expression: Verbal Verbal Expression Overall Verbal Expression: Appears within functional limits for tasks assessed Oral Motor Oral Motor/Sensory Function Overall Oral Motor/Sensory Function: Mild impairment Facial ROM: Reduced right Facial Symmetry: Abnormal symmetry right Facial Strength: Reduced right Facial Sensation: Within Functional Limits Lingual ROM: Within Functional Limits Lingual Symmetry: Abnormal symmetry right Lingual Strength: Within Functional Limits Lingual Sensation: Within Functional Limits Velum: Within Functional Limits Mandible: Within Functional Limits Motor Speech Overall Motor Speech: Impaired (slight impairment) Respiration: Within functional limits Phonation: Normal Resonance: Within functional limits Articulation: Impaired Level of Impairment: Control and instrumentation engineer: Witnin Education officer, environmental Errors: Not applicable  Care Tool Care Tool Cognition Ability to hear (with hearing aid or hearing appliances if normally used Ability to hear (with hearing aid or hearing appliances if normally used): 1. Minimal difficulty - difficulty in some environments (e.g. when person speaks softly or setting is noisy)   Expression of Ideas and Wants Expression of Ideas and Wants:  4. Without difficulty (complex and basic) - expresses complex messages without difficulty and with speech that is clear and easy to understand   Understanding Verbal and Non-Verbal Content Understanding Verbal and Non-Verbal Content: 4. Understands (complex and basic) - clear comprehension without cues or repetitions  Memory/Recall Ability Memory/Recall Ability : Current season;Location of own room;Staff names and faces;That he or she is in a hospital/hospital unit    Recommendations for other services: None   Discharge Criteria: Patient will be discharged from SLP if patient refuses treatment 3 consecutive times without medical reason, if treatment goals not met, if there is a change in medical status, if patient makes no progress towards goals or if patient is discharged from hospital.  The above assessment, treatment plan, treatment alternatives and goals were discussed and mutually agreed upon: by patient  Patty Sermons 04/18/2022, 5:06 PM

## 2022-04-18 NOTE — Progress Notes (Signed)
Occupational Therapy Session Note  Patient Details  Name: Ivan Lawson MRN: [id number] Date of Birth: 11-02-1956  Today's Date: 04/18/2022 OT Individual Time: 1000-1055 OT Individual Time Calculation (min): 55 min    Short Term Goals: Week 1:  OT Short Term Goal 1 (Week 1): STG = LTG 2/2 ELOS  Skilled Therapeutic Interventions/Progress Updates:    Pt received seated in recliner, reports L hip soreness but, agreeable to therapy. Session focus on activity tolerance, RUE NMR in prep for improved ADL/IADL/func mobility performance + decreased caregiver burden. Declines need for BADL at this time.  Ambulated throughout session with RW and CGA, cues to stay inside RW frame.   Pt reports success with built up handles to self feed, but pencil/built up handle as stylus for I-pad unsuccessful.    Administered the Nine Hole Peg Test (NHPT)  with the following results: -R:3 min 33 seconds -L: 33 seconds ( 37-45 seconds is considered below average ; greater than 45 seconds is considered poor) thus demonstrating deficits in fine motor skills, hand dexterity, and speed.  Issued medium soft theraputty, pt able to complete the following therex: remove/place small beads, pincer grasp, digit flexion, rolling into ball/log shape. Additionally, completed 1x10 digit extension with use of rubber band.  Pt completed the following games on BITS to target RUE NMR/FMC/GMC:  Game:Single target  Accuracy: 92.98% Reaction Time:  2.22 secs Duration Time: 2 min Hits: 53 hits   Accuracy: 95.24% Reaction Time:  1.99 secs Duration Time: 2 min Hits: 60 hits   Pt left seated in recliner with chair pad alarm engaged, call bell in reach, and all immediate needs met.    Therapy Documentation Precautions:  Precautions Precautions: Fall Precaution Comments: R hemipareisis UE>LE, very mild ataxia Restrictions Weight Bearing Restrictions: No  Pain: L hip soreness , did not rate/no request for intervention at this  time   ADL: See Care Tool for more details.  Therapy/Group: Individual Therapy  Volanda Napoleon MS, OTR/L  04/18/2022, 6:52 AM

## 2022-04-18 NOTE — Progress Notes (Signed)
Inpatient Rehabilitation Center Individual Statement of Services  Patient Name:  MARION ROSENBERRY  Date:  04/18/2022  Welcome to the Inpatient Rehabilitation Center.  Our goal is to provide you with an individualized program based on your diagnosis and situation, designed to meet your specific needs.  With this comprehensive rehabilitation program, you will be expected to participate in at least 3 hours of rehabilitation therapies Monday-Friday, with modified therapy programming on the weekends.  Your rehabilitation program will include the following services:  Physical Therapy (PT), Occupational Therapy (OT), Speech Therapy (ST), 24 hour per day rehabilitation nursing, Therapeutic Recreaction (TR), Neuropsychology, Care Coordinator, Rehabilitation Medicine, Nutrition Services, Pharmacy Services, and Other  Weekly team conferences will be held on Wednesdays to discuss your progress.  Your Inpatient Rehabilitation Care Coordinator will talk with you frequently to get your input and to update you on team discussions.  Team conferences with you and your family in attendance may also be held.  Expected length of stay:  12 Days  Overall anticipated outcome:  Min A  Depending on your progress and recovery, your program may change. Your Inpatient Rehabilitation Care Coordinator will coordinate services and will keep you informed of any changes. Your Inpatient Rehabilitation Care Coordinator's name and contact numbers are listed  below.  The following services may also be recommended but are not provided by the Inpatient Rehabilitation Center:   Home Health Rehabiltiation Services Outpatient Rehabilitation Services    Arrangements will be made to provide these services after discharge if needed.  Arrangements include referral to agencies that provide these services.  Your insurance has been verified to be:   HTA Your primary doctor is:  Blair Heys, MD  Pertinent information will be shared with  your doctor and your insurance company.  Inpatient Rehabilitation Care Coordinator:  Lavera Guise, [place] [phone] or [phone]  Information discussed with and copy given to patient by: Andria Rhein, 04/18/2022, 10:55 AM

## 2022-04-19 NOTE — Progress Notes (Addendum)
PROGRESS NOTE   Subjective/Complaints:  Seen in OT, no c/o except fatigue, good BM yesterday  ROS- neg CP, SOB, N/V/D  Objective:   No results found. Recent Labs    04/17/22 0624  WBC 7.7  HGB 15.4  HCT 44.4  PLT 214    Recent Labs    04/17/22 0624  NA 137  K 4.6  CL 106  CO2 25  GLUCOSE 95  BUN 19  CREATININE 1.38*  CALCIUM 9.2     Intake/Output Summary (Last 24 hours) at 04/19/2022 0759 Last data filed at 04/19/2022 0600 Gross per 24 hour  Intake 716 ml  Output 700 ml  Net 16 ml         Physical Exam: Vital Signs Blood pressure 129/81, pulse 60, temperature 97.9 F (36.6 C), resp. rate 18, height 6' (1.829 m), weight 96.9 kg, SpO2 96 %.   General: No acute distress Mood and affect are appropriate Heart: Regular rate and rhythm no rubs murmurs or extra sounds Lungs: Clear to auscultation, breathing unlabored, no rales or wheezes Abdomen: Positive bowel sounds, soft nontender to palpation, nondistended Extremities: No clubbing, cyanosis, or edema Skin: No evidence of breakdown, no evidence of rash Neurologic: Cranial nerves II through XII intact, motor strength is 5/5 in left and 4/5 Right  deltoid, bicep, tricep, grip, hip flexor, knee extensors, ankle dorsiflexor and plantar flexor Sensory exam normal sensation to light touch and proprioception in bilateral upper and lower extremities  Musculoskeletal: Full range of motion in all 4 extremities. No joint swelling   Assessment/Plan: 1. Functional deficits which require 3+ hours per day of interdisciplinary therapy in a comprehensive inpatient rehab setting. Physiatrist is providing close team supervision and 24 hour management of active medical problems listed below. Physiatrist and rehab team continue to assess barriers to discharge/monitor patient progress toward functional and medical goals  Care Tool:  Bathing  Bathing activity did not  occur: Refused           Bathing assist       Upper Body Dressing/Undressing Upper body dressing   What is the patient wearing?: Pull over shirt    Upper body assist Assist Level: Supervision/Verbal cueing    Lower Body Dressing/Undressing Lower body dressing      What is the patient wearing?: Underwear/pull up     Lower body assist Assist for lower body dressing: Minimal Assistance - Patient > 75%     Toileting Toileting    Toileting assist Assist for toileting: Contact Guard/Touching assist     Transfers Chair/bed transfer  Transfers assist     Chair/bed transfer assist level: Contact Guard/Touching assist     Locomotion Ambulation   Ambulation assist      Assist level: Contact Guard/Touching assist Assistive device: No Device Max distance: >200 ft   Walk 10 feet activity   Assist     Assist level: Contact Guard/Touching assist Assistive device: No Device   Walk 50 feet activity   Assist    Assist level: Contact Guard/Touching assist Assistive device: No Device    Walk 150 feet activity   Assist    Assist level: Contact Guard/Touching assist Assistive device: No  Device    Walk 10 feet on uneven surface  activity   Assist     Assist level: Minimal Assistance - Patient > 75% Assistive device: Other (comment) (no AD)   Wheelchair     Assist Is the patient using a wheelchair?: No   Wheelchair activity did not occur: N/A         Wheelchair 50 feet with 2 turns activity    Assist    Wheelchair 50 feet with 2 turns activity did not occur: N/A       Wheelchair 150 feet activity     Assist  Wheelchair 150 feet activity did not occur: N/A       Blood pressure 129/81, pulse 60, temperature 97.9 F (36.6 C), resp. rate 18, height 6' (1.829 m), weight 96.9 kg, SpO2 96 %.  Medical Problem List and Plan: 1. Functional deficits secondary to extension of left paramedian pontine infarct likely secondary to  basilar artery stenosis             -patient may shower             -ELOS/Goals: 12 days Min A             -Admit to CIR PT, OT, add SLP for dysarthria eval  2.  Antithrombotics: -DVT/anticoagulation:  Pharmaceutical: Lovenox             -antiplatelet therapy: aspirin 81 mg daily and brilinta 3. Pain Management: Tylenol prn 4. Mood: LCSW to evaluate and provide emotional support             -antipsychotic agents: na 5. Neuropsych: This patient is capable of making decisions on his own behalf. 6. Skin/Wound Care: Routine skin care checks 7. Fluids/Electrolytes/Nutrition: Routine Is and Os and follow-up chemistries 8: Pontine stroke:              Brilinta 90 mg twice daily plus aspirin 81 mg daily for 1 month             Then Plavix 75 mg daily plus aspirin 81 mg daily for 2 months Then aspirin 81 mg daily only Continue statin 30 day cardiac event monitor at discharge 9: Hypertension: Continue amlodipine 2.5 mg daily. Holding hydralazine --home regimen: hydralazine 50 mg TID and amlodipine 10 mg daily             --Avoid low blood pressure given basilar artery stenosis             - BP has been essentially normal off meds will resume as needed starting with amlodipine  Vitals:   04/18/22 1930 04/19/22 0403  BP: (!) 149/83 129/81  Pulse: 64 60  Resp: 18 18  Temp: 97.6 F (36.4 C) 97.9 F (36.6 C)  SpO2: 100% 96%    10: Graves disease s/p RAI/acquired hypothyroidism: continue Synthroid 11: Prediabetes: follow-up PCP/[facility]            -Glucose appears in good range on BMP, follow 12: Hyperlipidemia: continue Crestor 20 mg daily 13: CKD 3a: elevated creatinine 1.26 (?? baseline); follow-up BMP    Latest Ref Rng & Units 04/17/2022    6:24 AM 04/13/2022    1:03 AM 04/12/2022    8:20 AM  BMP  Glucose 70 - 99 mg/dL 95   93     BUN 8 - 23 mg/dL 19   15     Creatinine 0.61 - 1.24 mg/dL 1.38   1.26   1.27    Sodium  135 - 145 mmol/L 137   136     Potassium 3.5 - 5.1  mmol/L 4.6   3.7     Chloride 98 - 111 mmol/L 106   110     CO2 22 - 32 mmol/L 25   19     Calcium 8.9 - 10.3 mg/dL 9.2   8.4       14: Vitamin B12 deficiency: continue 1,000 mcg daily 15. Constipation, senokot , sorbitol PRN             -Improved with BM today    LOS: 3 days A FACE TO FACE EVALUATION WAS PERFORMED  Charlett Blake 04/19/2022, 7:59 AM

## 2022-04-19 NOTE — IPOC Note (Signed)
Overall Plan of Care [facility]) Patient Details Name: Ivan Lawson MRN: [id number] DOB: 1956-01-26  Admitting Diagnosis: Left pontine cerebrovascular accident [facility])  Hospital Problems: Principal Problem:   Left pontine cerebrovascular accident [facility])     Functional Problem List: Nursing Bowel, Endurance, Medication Management, Safety  PT Balance, Endurance, Motor, Safety, Sensory  OT Balance, Motor, Endurance  SLP    TR         Basic ADL's: OT Eating, Grooming, Bathing, Dressing, Toileting     Advanced  ADL's: OT Simple Meal Preparation     Transfers: PT Bed Mobility, Bed to Chair, Car, [employer], Floor  OT Toilet, Research scientist (life sciences): PT Ambulation, Stairs     Additional Impairments: OT Fuctional Use of Upper Extremity  SLP        TR      Anticipated Outcomes Item Anticipated Outcome  Self Feeding mod I  Swallowing      Basic self-care  mod I  Toileting  mod I   Bathroom Transfers mod I  Bowel/Bladder  manage bowel w mod I assist  Transfers  Mod I  Locomotion  Mod I/ supervision  Communication     Cognition     Pain  pain at or below level 4 with prns  Safety/Judgment  maintain safety w cues   Therapy Plan: PT Intensity: Minimum of 1-2 x/day ,45 to 90 minutes PT Frequency: 5 out of 7 days PT Duration Estimated Length of Stay: 10-12 days OT Intensity: Minimum of 1-2 x/day, 45 to 90 minutes OT Frequency: 5 out of 7 days OT Duration/Estimated Length of Stay: 7-10 days     Team Interventions: Nursing Interventions Disease Management/Prevention, Medication Management, Discharge Planning, Pain Management, Bowel Management, Patient/Family Education  PT interventions Ambulation/gait training, Community reintegration, DME/adaptive equipment instruction, Neuromuscular re-education, Psychosocial support, Stair training, UE/LE Strength taining/ROM, Warden/ranger, Discharge planning, Therapeutic Activities, UE/LE Coordination  activities, Visual/perceptual remediation/compensation, Therapeutic Exercise, Splinting/orthotics, Patient/family education, Functional mobility training, Disease management/prevention, Cognitive remediation/compensation  OT Interventions Balance/vestibular training, Disease mangement/prevention, Neuromuscular re-education, Self Care/advanced ADL retraining, Therapeutic Exercise, DME/adaptive equipment instruction, UE/LE Strength taining/ROM, UE/LE Coordination activities, Splinting/orthotics, Patient/family education, Functional electrical stimulation, Community reintegration, Discharge planning, Functional mobility training, Psychosocial support, Therapeutic Activities  SLP Interventions    TR Interventions    SW/CM Interventions Discharge Planning, Psychosocial Support, Patient/Family Education, Disease Management/Prevention   Barriers to Discharge MD  Medical stability  Nursing Decreased caregiver support, Home environment access/layout 1 level 6ste ril rails solo, brother to check in on him  PT Inaccessible home environment, Decreased caregiver support, Home environment Best boy, Lack of/limited family support, Community education officer for SNF coverage    OT Decreased caregiver support, Inaccessible home environment, Home environment Best boy    SLP      SW Insurance for SNF coverage, Decreased caregiver support, Lack of/limited family support     Team Discharge Planning: Destination: PT-Home ,OT- Home , SLP-Home Projected Follow-up: PT-Outpatient PT, OT-  Outpatient OT, SLP-None Projected Equipment Needs: PT-To be determined, OT- To be determined, SLP-None recommended by SLP Equipment Details: PT- , OT-  Patient/family involved in discharge planning: PT- Patient,  OT-Patient, SLP-Patient  MD ELOS: 10-12d Medical Rehab Prognosis:  Good Assessment: The patient has been admitted for CIR therapies with the diagnosis of Left pontine CVA. The team will be addressing functional mobility,  strength, stamina, balance, safety, adaptive techniques and equipment, self-care, bowel and bladder mgt, patient and caregiver education, HTN management . Goals have been set at mod I. Anticipated  discharge destination is home .        See Team Conference Notes for weekly updates to the plan of care

## 2022-04-19 NOTE — Progress Notes (Signed)
Physical Therapy Session Note  Patient Details  Name: Ivan Lawson MRN: [id number] Date of Birth: [birth year]  Today's Date: 04/19/2022 PT Individual Time: 6546-5035 PT Individual Time Calculation (min): 59 min   Short Term Goals: Week 1:  PT Short Term Goal 1 (Week 1): Pt will perform bed mobility with consistent supervision/ IND and no use of bed features. PT Short Term Goal 2 (Week 1): Pt will perform all functional transfers with no AD and consistent supervision. PT Short Term Goal 3 (Week 1): Pt will ambulate >300 ft with no AD and consistent supervision and decreased need for rest breaks. PT Short Term Goal 4 (Week 1): Pt will complete at least 6 steps with supervision and no cueing for proper technique. PT Short Term Goal 5 (Week 1): Pt will improve score on TUG by at least 5 seconds to demonstrate improved fall risk.  Skilled Therapeutic Interventions/Progress Updates:  Patient eated upright in recliner on entrance to room. Patient alert and agreeable to PT session.   Patient with no pain complaint throughout session.  Therapeutic Activity: Transfers: Patient performed sit<>stand and stand pivot transfers throughout session with supervision. Provided verbal cues for taking time in pivot stepping and full turn with reach back to seat.  Gait Training:  Patient ambulated >200 ft x2 with no AD and close SUP/ light CGA for balance. Demonstrated decreased stance time on RLE. Provided vc/ tc for equalizing time on each LE.  Neuromuscular Re-ed: NMR facilitated during session with focus on dynamic stepping balance. Pt guided in agility ladder drillsfor improved RLE coordination and balance. Pt progressed from step into each square to attempted high knee stepping to toe taps to R side only then L side only in order to provide time to develop motor plan for improved performance. Requires education on need for different motor plans for improved balance to stance over RLE and then for  coordinated movement  of RLE to top of cone. Also demonstrated need for balance shift from back foot to fwd foot with single leg balance prior to LE advancement. Slight improvement noted with conscious practice. NMR performed for improvements in motor control and coordination, balance, sequencing, judgement, and self confidence/ efficacy in performing all aspects of mobility at highest level of independence.   Patient seated  in recliner at end of session with brakes locked, seat pad alarm set, and all needs within reach.  Therapy Documentation Precautions:  Precautions Precautions: Fall Precaution Comments: R hemipareisis UE>LE, very mild ataxia Restrictions Weight Bearing Restrictions: No General:   Vital Signs:   Pain: Pain Assessment Pain Scale: 0-10 Pain Score: 0-No pain  Therapy/Group: Individual Therapy  Loel Dubonnet PT, DPT 04/19/2022, 12:19 PM

## 2022-04-19 NOTE — Progress Notes (Signed)
Occupational Therapy Session Note  Patient Details  Name: Ivan Lawson MRN: [id number] Date of Birth: [birth year]  Today's Date: 04/19/2022 OT Individual Time: 0702-0817 OT Individual Time Calculation (min): 75 min + 30 min   Short Term Goals: Week 1:  OT Short Term Goal 1 (Week 1): STG = LTG 2/2 ELOS  Skilled Therapeutic Interventions/Progress Updates:    Session 1 (5465-0354): Pt received seated in recliner finishing breakfast denies pain, agreeable to therapy. Session focus on self-care retraining, activity tolerance, RUE NMR/FMC in prep for improved ADL/IADL/func mobility performance + decreased caregiver burden.  Pt able to open all breakfast items himself with use of L hand and increased time. Declined shower this am with prefence to go to gym. Did don B socks/shoes/tie shoes via figure 4 position and use of trash can as foot stool with S and increased time. Stand-pivot transfer to sink CGA and no AD, seated oral care and UBD with mod I.  Ambulated to and from gym close S with RW, cues to keep inside RW frame.   Participated in various therac to promote RUE [place] and [facility] including bouncing and tossing ball at various distances, playing basket ball, flipping and shuffling cards. Pt reports 7/10 difficulty to complete card task. MD in/out morning rounds.  Pt left seated in recliner with chair pad alarm engaged, call bell in reach, and all immediate needs met.    Session 2 [phone]): Pt received seated in recliner, denies pain, agreeable to therapy. Session focus on RUE NMR in prep for improved ADL/IADL/func mobility performance + decreased caregiver burden.  Ambulated to and from Day room with no AD and close S to light CGA for steadying assist around busy hall environment.  Played two rounds of corn hole utilizing RUE, able to retrieve bags from behind him on mat with CGA for balance. Scored 8 total holes.  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 Able to  "hook" looped blocks and place in bucket with RUE guiding pole. Pt very motivated to to return to fishing and is pleased he can handle rod.  Pt left seated in recliner with chair pad alarm engaged, call bell in reach, and all immediate needs met.     Therapy Documentation Precautions:  Precautions Precautions: Fall Precaution Comments: R hemipareisis UE>LE, very mild ataxia Restrictions Weight Bearing Restrictions: No  Pain: denies   ADL: See Care Tool for more details. Therapy/Group: Individual Therapy  Volanda Napoleon MS, OTR/L  04/19/2022, 6:55 AM

## 2022-04-20 MED ORDER — AMLODIPINE BESYLATE 2.5 MG PO TABS
2.5000 mg | ORAL_TABLET | Freq: Every day | ORAL | Status: DC
  Administered 2022-04-20 – 2022-04-25 (×6): 2.5 mg via ORAL
  Filled 2022-04-20 (×6): qty 1

## 2022-04-20 NOTE — Progress Notes (Signed)
Occupational Therapy Session Note  Patient Details  Name: Ivan Lawson MRN: [id number] Date of Birth: 02/21/1956  Today's Date: 04/20/2022 OT Individual Time: 0831-0930 OT Individual Time Calculation (min): 59 min    Short Term Goals: Week 1:  OT Short Term Goal 1 (Week 1): STG = LTG 2/2 ELOS  Skilled Therapeutic Interventions/Progress Updates:    Pt received seated in recliner, denies pain, agreeable to therapy. Session focus on self-care retraining, activity tolerance,  RUE NMR in prep for improved ADL/IADL/func mobility performance + decreased caregiver burden. Changed out shirt and completed oral care with mod I/seated/increased time. Ambulated to and from toilet with no AD and CGA, continent void of bladder in standing. Ambulated at same level to and from gym, reports feeling "floppy" this morning due to twinge in "L " hip.   Pt participated in various therac to target RUE NMR/WB/ataxia including maintaining quadruped position to remove/place velcro cards, 2x10 modified push-ups in quadruped, placing/removing levels 1-5 resistive clothes pins on vertical bar via three jaw chuck grasp, and playing game of Jenga. Pt able to complete all activities with increased time, reports that he feels RUE "got a good workout."  Pt left seated in recliner with chair pad alarm engaged, call bell in reach, and all immediate needs met.    Therapy Documentation Precautions:  Precautions Precautions: Fall Precaution Comments: R hemipareisis UE>LE, very mild ataxia Restrictions Weight Bearing Restrictions: No  Pain:  denies ADL: See Care Tool for more details.  Therapy/Group: Individual Therapy   A  MS, OTR/L  04/20/2022, 7:00 AM 

## 2022-04-20 NOTE — Progress Notes (Signed)
Physical Therapy Session Note  Patient Details  Name: Ivan Lawson MRN: [id number] Date of Birth: 13-Mar-1956  Today's Date: 04/20/2022 PT Individual Time: 1305-1400 PT Individual Time Calculation (min): 55 min   Short Term Goals: Week 1:  PT Short Term Goal 1 (Week 1): Pt will perform bed mobility with consistent supervision/ IND and no use of bed features. PT Short Term Goal 2 (Week 1): Pt will perform all functional transfers with no AD and consistent supervision. PT Short Term Goal 3 (Week 1): Pt will ambulate >300 ft with no AD and consistent supervision and decreased need for rest breaks. PT Short Term Goal 4 (Week 1): Pt will complete at least 6 steps with supervision and no cueing for proper technique. PT Short Term Goal 5 (Week 1): Pt will improve score on TUG by at least 5 seconds to demonstrate improved fall risk.  Skilled Therapeutic Interventions/Progress Updates:   Pt received sitting in recliner and agreeable to PT. Pt performed sit<>stand transfer with supervision assist with cues for decreased UE use .    Gait training without AD x 163f, 1287f 2209fith cues for decreased force of heel contact on the RLE. No LOB noted throughout with supervision assist from PT   Dynamic balance and fine motor task of Wii bowling with . X 5 frames on level surface and 5 frames standing on airex pad. Min assist standing on airex pad with min cues for ankle strategy to prevent posterior LOB increasing AP sway when fatigued.   Nustep reciprocal movement training 5 min +5 min with therapeutic rest break between bouts cues for decreased speed to prevent excessive fatigue and improved grip on the RUE.       D[employer] balance training to perform reciprocal foot steps  on 4inch step x 10 and lateral stepping R and L x 10 each with min assist to prevent LOB to the R intermittently.   Patient returned to room and performed stand pivot to recliner with no AD and supervision assist. Pt left sitting  in recliner with call bell in reach and all needs met.           Therapy Documentation Precautions:  Precautions Precautions: Fall Precaution Comments: R hemipareisis UE>LE, very mild ataxia Restrictions Weight Bearing Restrictions: No   Pain: Pain Assessment Pain Scale: 0-10 Pain Score: 0-No pain     Therapy/Group: Individual Therapy  AusLorie Phenix20/2023, 3:33 PM

## 2022-04-20 NOTE — Progress Notes (Signed)
Physical Therapy Session Note  Patient Details  Name: Ivan Lawson MRN: [id number] Date of Birth: [birth year]  Today's Date: 04/20/2022 PT Individual Time: 1700-1738 PT Individual Time Calculation (min): 38 min   Short Term Goals: Week 1:  PT Short Term Goal 1 (Week 1): Pt will perform bed mobility with consistent supervision/ IND and no use of bed features. PT Short Term Goal 2 (Week 1): Pt will perform all functional transfers with no AD and consistent supervision. PT Short Term Goal 3 (Week 1): Pt will ambulate >300 ft with no AD and consistent supervision and decreased need for rest breaks. PT Short Term Goal 4 (Week 1): Pt will complete at least 6 steps with supervision and no cueing for proper technique. PT Short Term Goal 5 (Week 1): Pt will improve score on TUG by at least 5 seconds to demonstrate improved fall risk.   Skilled Therapeutic Interventions/Progress Updates:   Pt received sitting in recliner and agreeable to PT. Pt performed sit<>stand transfer with supervision assist for safety withmild LOB once in standing but able to correct without assist from PT. Gait training through simulated community environment with no AD through hospital hallway, atrium and over cement sidewalk performed 4 bouts ~ 614f per bout. Cues for decreased force heel contact on RLE and improved trunkal rotation to allow improved normalized movements. Patient returned to room and performed hand hygiene at sink without assist, stand pivot to recliner with supervision assist and no AD. Pt left sitting in recliner with call bell in reach and all needs met.         Therapy Documentation Precautions:  Precautions Precautions: Fall Precaution Comments: R hemipareisis UE>LE, very mild ataxia Restrictions Weight Bearing Restrictions: No  Vital Signs: Therapy Vitals Temp: 97.9 F (36.6 C) Temp Source: Oral Pulse Rate: 62 Resp: 16 BP: (!) 145/80 Patient Position (if appropriate): Sitting Oxygen  Therapy SpO2: 100 % O2 Device: Room Air Pain: denies   Therapy/Group: Individual Therapy  ALorie Phenix5/20/2023, 5:38 PM

## 2022-04-20 NOTE — Progress Notes (Signed)
PROGRESS NOTE   Subjective/Complaints: Patient seen in bed, LBM on 5/18.  No complaints except fatigue, learning to pace therapy.  Reports speech is better but also worsening when he is tired.  ROS- neg CP, SOB, N/V/D  Objective:   No results found. No results for input(s): WBC, HGB, HCT, PLT in the last 72 hours. No results for input(s): NA, K, CL, CO2, GLUCOSE, BUN, CREATININE, CALCIUM in the last 72 hours.  Intake/Output Summary (Last 24 hours) at 04/20/2022 1512 Last data filed at 04/20/2022 0900 Gross per 24 hour  Intake 476 ml  Output 700 ml  Net -224 ml        Physical Exam: Vital Signs Blood pressure 135/75, pulse (!) 57, temperature (!) 97.4 F (36.3 C), temperature source Oral, resp. rate 18, height 6' (1.829 m), weight 96.9 kg, SpO2 99 %.   General: No acute distress Mood and affect are appropriate Heart: Regular rate and rhythm no rubs murmurs or extra sounds Lungs: Clear to auscultation, breathing unlabored, no rales or wheezes Abdomen: Positive bowel sounds, soft nontender to palpation, nondistended Extremities: No clubbing, cyanosis, or edema Skin: No evidence of breakdown, no evidence of rash Neurologic: Cranial nerves II through XII intact, right facial droop, motor strength is 5/5 in left and 4/5 Right  deltoid, bicep, tricep, grip, hip flexor, knee extensors, ankle dorsiflexor and plantar flexor. Sensory: Normal to LT and proprioception BUE/BLE. Musculoskeletal: Full range of motion in all 4 extremities. No joint swelling   Assessment/Plan: 1. Functional deficits which require 3+ hours per day of interdisciplinary therapy in a comprehensive inpatient rehab setting. Physiatrist is providing close team supervision and 24 hour management of active medical problems listed below. Physiatrist and rehab team continue to assess barriers to discharge/monitor patient progress toward functional and medical  goals  Care Tool:  Bathing  Bathing activity did not occur: Refused           Bathing assist       Upper Body Dressing/Undressing Upper body dressing   What is the patient wearing?: Pull over shirt    Upper body assist Assist Level: Supervision/Verbal cueing    Lower Body Dressing/Undressing Lower body dressing      What is the patient wearing?: Underwear/pull up     Lower body assist Assist for lower body dressing: Moderate Assistance - Patient 50 - 74%     Toileting Toileting    Toileting assist Assist for toileting: Moderate Assistance - Patient 50 - 74%     Transfers Chair/bed transfer  Transfers assist     Chair/bed transfer assist level: Contact Guard/Touching assist     Locomotion Ambulation   Ambulation assist      Assist level: Contact Guard/Touching assist Assistive device: No Device Max distance: >200 ft   Walk 10 feet activity   Assist     Assist level: Contact Guard/Touching assist Assistive device: No Device   Walk 50 feet activity   Assist    Assist level: Contact Guard/Touching assist Assistive device: No Device    Walk 150 feet activity   Assist    Assist level: Contact Guard/Touching assist Assistive device: No Device    Walk 10  feet on uneven surface  activity   Assist     Assist level: Minimal Assistance - Patient > 75% Assistive device: Other (comment) (no AD)   Wheelchair     Assist Is the patient using a wheelchair?: No   Wheelchair activity did not occur: N/A         Wheelchair 50 feet with 2 turns activity    Assist    Wheelchair 50 feet with 2 turns activity did not occur: N/A       Wheelchair 150 feet activity     Assist  Wheelchair 150 feet activity did not occur: N/A       Blood pressure 135/75, pulse (!) 57, temperature (!) 97.4 F (36.3 C), temperature source Oral, resp. rate 18, height 6' (1.829 m), weight 96.9 kg, SpO2 99 %.  Medical Problem List and  Plan: 1. Functional deficits secondary to extension of left paramedian pontine infarct likely secondary to basilar artery stenosis             -patient may shower             -ELOS/Goals: 12 days Min A             -Admit to CIR PT, OT, add SLP for dysarthria eval -5/20 Dysarthria improving, is close to normal speech, SLP signed off but OT still working with pt.  2.  Antithrombotics: -DVT/anticoagulation:  Pharmaceutical: Lovenox             -antiplatelet therapy: aspirin 81 mg daily and brilinta 3. Pain Management: Tylenol prn 4. Mood: LCSW to evaluate and provide emotional support             -antipsychotic agents: na 5. Neuropsych: This patient is capable of making decisions on his own behalf. 6. Skin/Wound Care: Routine skin care checks 7. Fluids/Electrolytes/Nutrition: Routine Is and Os and follow-up chemistries 8: Pontine stroke:              Brilinta 90 mg twice daily plus aspirin 81 mg daily for 1 month             Then Plavix 75 mg daily plus aspirin 81 mg daily for 2 months Then aspirin 81 mg daily only Continue statin 30 day cardiac event monitor at discharge 9: Hypertension: Continue amlodipine 2.5 mg daily. Holding hydralazine --home regimen: hydralazine 50 mg TID and amlodipine 10 mg daily             --Avoid low blood pressure given basilar artery stenosis             - BP has been essentially normal off meds will resume as needed starting with amlodipine  5/20 Some elevation in Bp, peak today 154/84, pt was not on any anti-hypertensives.  Start 2.5 mg amlodipine daily and trend. Vitals:   04/19/22 2008 04/20/22 0414  BP: (!) 154/84 135/75  Pulse: 70 (!) 57  Resp: 18 18  Temp: 98.5 F (36.9 C) (!) 97.4 F (36.3 C)  SpO2: 100% 99%    10: Graves disease s/p RAI/acquired hypothyroidism: continue Synthroid 11: Prediabetes: follow-up PCP/[facility]            -Glucose appears in good range on BMP, follow 12: Hyperlipidemia: continue Crestor 20 mg daily 13:  CKD 3a: elevated creatinine 1.26 (?? baseline); follow-up BMP 5/20 Slightly elevated Crt, continue to trend with qMonday labs.    Latest Ref Rng & Units 04/17/2022    6:24 AM 04/13/2022    1:03 AM  04/12/2022    8:20 AM  BMP  Glucose 70 - 99 mg/dL 95   93     BUN 8 - 23 mg/dL 19   15     Creatinine 0.61 - 1.24 mg/dL 2.44   0.10   2.72    Sodium 135 - 145 mmol/L 137   136     Potassium 3.5 - 5.1 mmol/L 4.6   3.7     Chloride 98 - 111 mmol/L 106   110     CO2 22 - 32 mmol/L 25   19     Calcium 8.9 - 10.3 mg/dL 9.2   8.4       14: Vitamin B12 deficiency: continue 1,000 mcg daily 15. Constipation, senokot , sorbitol PRN             -Improved with BM today             -5/20 LBM 5/19, continue bowel regimen    LOS: 4 days A FACE TO FACE EVALUATION WAS PERFORMED  Tressia Miners 04/20/2022, 3:12 PM

## 2022-04-20 NOTE — Progress Notes (Signed)
Physical Therapy Session Note  Patient Details  Name: Ivan Lawson MRN: [id number] Date of Birth: 1956/11/29  Today's Date: 04/20/2022 PT Individual Time: 1110-1200 PT Individual Time Calculation (min): 50 min   Short Term Goals: Week 1:  PT Short Term Goal 1 (Week 1): Pt will perform bed mobility with consistent supervision/ IND and no use of bed features. PT Short Term Goal 2 (Week 1): Pt will perform all functional transfers with no AD and consistent supervision. PT Short Term Goal 3 (Week 1): Pt will ambulate >300 ft with no AD and consistent supervision and decreased need for rest breaks. PT Short Term Goal 4 (Week 1): Pt will complete at least 6 steps with supervision and no cueing for proper technique. PT Short Term Goal 5 (Week 1): Pt will improve score on TUG by at least 5 seconds to demonstrate improved fall risk.   Skilled Therapeutic Interventions/Progress Updates:  Patient seated in recliner on entrance to room. Patient alert and agreeable to PT session.   Patient with no pain complaint throughout session.  Therapeutic Activity: Transfers: Patient performed sit<>stand and stand pivot transfers throughout session with supervision. Provided verbal cues for attention to completion of turn in pivot stepping.  Gait Training:  Patient ambulated >200 ft using no AD with close supervision. On return trip to room, pt provided with ball to carry and no deviation of path.  Demonstrated decreased stance time over RLE. Provided vc/ tc for equalizing time over each LE.  Pt guided in stair training with physical demonstration and verbal instructions provided prior to performance. Pt is able to complete twelve 6" steps using BHR with reciprocal step pattern to ascend and descend. BLE appear strong in each direction. VC provided at initiation of each direction for attention to RLE strength and performance.     Neuromuscular Re-ed: NMR facilitated during session with focus on dynamic  stepping balance. Pt guided in dynamic standing balance with beanbag toss to target. Focus on balance with dynamic step with coordinated contralateral UE toss with no AD. Start with toss with LUE and step with RLE. Requires a few practice steps to get good step ckearance and coordinate UE throw. 2 bouts with improving perofrmance and decreased LOB throughout. Same with change to opposite side. Minor LOB intermittently with pt able to self correct with stepping strategy and CGA/ MinA provided by therapist. NMR performed for improvements in motor control and coordination, balance, sequencing, judgement, and self confidence/ efficacy in performing all aspects of mobility at highest level of independence.   Patient seated  in recliner at end of session with brakes locked, seat pad alarm set, and all needs within reach.  Therapy Documentation Precautions:  Precautions Precautions: Fall Precaution Comments: R hemipareisis UE>LE, very mild ataxia Restrictions Weight Bearing Restrictions: No General:   Vital Signs:  Pain: Pain Assessment Pain Scale: 0-10 Pain Score: 0-No pain  Therapy/Group: Individual Therapy  Alger Simons PT, DPT 04/20/2022, 2:15 PM

## 2022-04-21 NOTE — Progress Notes (Signed)
PROGRESS NOTE   Subjective/Complaints: Patient seen in chair today, said that he walked outside with PT yesterday, which was good therapy, but made him fatigued.  He reports that he is still needing to work on his right hand dexterity, primary grip and fine motor control of fingers.  ROS- neg CP, SOB, N/V/D  Objective:   No results found. No results for input(s): WBC, HGB, HCT, PLT in the last 72 hours. No results for input(s): NA, K, CL, CO2, GLUCOSE, BUN, CREATININE, CALCIUM in the last 72 hours.  Intake/Output Summary (Last 24 hours) at 04/21/2022 1101 Last data filed at 04/21/2022 0739 Gross per 24 hour  Intake 1099 ml  Output 1375 ml  Net -276 ml        Physical Exam: Vital Signs Blood pressure 115/80, pulse 61, temperature 98.5 F (36.9 C), temperature source Oral, resp. rate 18, height 6' (1.829 m), weight 96.9 kg, SpO2 98 %.   General: No acute distress Mood and affect are appropriate Heart: Regular rate and rhythm no rubs murmurs or extra sounds Lungs: Clear to auscultation, breathing unlabored, no rales or wheezes Abdomen: Positive bowel sounds, soft nontender to palpation, nondistended Extremities: No clubbing, cyanosis, or edema Skin: No evidence of breakdown, no evidence of rash Neurologic: Cranial nerves II through XII intact, right facial droop, motor strength is 5/5 in left and 4/5 Right deltoid, bicep, tricep, grip, hip flexor, knee extensors, ankle dorsiflexor and plantar flexor. Note:  Patient can touch right thumb to each finger of right hand.  Has good gross motor control of right hand but has reduced fine motor control of right hand compared to left. Speech is improving. Sensory: Normal to LT and proprioception BUE/BLE. Musculoskeletal: Full range of motion in all 4 extremities. No joint swelling   Assessment/Plan: 1. Functional deficits which require 3+ hours per day of interdisciplinary therapy  in a comprehensive inpatient rehab setting. Physiatrist is providing close team supervision and 24 hour management of active medical problems listed below. Physiatrist and rehab team continue to assess barriers to discharge/monitor patient progress toward functional and medical goals  Care Tool:  Bathing  Bathing activity did not occur: Refused           Bathing assist       Upper Body Dressing/Undressing Upper body dressing   What is the patient wearing?: Pull over shirt    Upper body assist Assist Level: Supervision/Verbal cueing    Lower Body Dressing/Undressing Lower body dressing      What is the patient wearing?: Underwear/pull up     Lower body assist Assist for lower body dressing: Moderate Assistance - Patient 50 - 74%     Toileting Toileting    Toileting assist Assist for toileting: Moderate Assistance - Patient 50 - 74%     Transfers Chair/bed transfer  Transfers assist     Chair/bed transfer assist level: Contact Guard/Touching assist     Locomotion Ambulation   Ambulation assist      Assist level: Contact Guard/Touching assist Assistive device: No Device Max distance: >200 ft   Walk 10 feet activity   Assist     Assist level: Contact Guard/Touching assist Assistive device:  No Device   Walk 50 feet activity   Assist    Assist level: Contact Guard/Touching assist Assistive device: No Device    Walk 150 feet activity   Assist    Assist level: Contact Guard/Touching assist Assistive device: No Device    Walk 10 feet on uneven surface  activity   Assist     Assist level: Minimal Assistance - Patient > 75% Assistive device: Other (comment) (no AD)   Wheelchair     Assist Is the patient using a wheelchair?: No   Wheelchair activity did not occur: N/A         Wheelchair 50 feet with 2 turns activity    Assist    Wheelchair 50 feet with 2 turns activity did not occur: N/A       Wheelchair 150  feet activity     Assist  Wheelchair 150 feet activity did not occur: N/A       Blood pressure 115/80, pulse 61, temperature 98.5 F (36.9 C), temperature source Oral, resp. rate 18, height 6' (1.829 m), weight 96.9 kg, SpO2 98 %.  Medical Problem List and Plan: 1. Functional deficits secondary to extension of left paramedian pontine infarct likely secondary to basilar artery stenosis             -patient may shower             -ELOS/Goals: 12 days Min A             -Admit to CIR PT, OT, add SLP for dysarthria eval -5/20 Dysarthria improving, is close to normal speech, SLP signed off but OT still working with pt.  2.  Antithrombotics: -DVT/anticoagulation:  Pharmaceutical: Lovenox             -antiplatelet therapy: aspirin 81 mg daily and brilinta              5/21 Continue ASA and brillinta 3. Pain Management: Tylenol prn 4. Mood: LCSW to evaluate and provide emotional support             -antipsychotic agents: na 5. Neuropsych: This patient is capable of making decisions on his own behalf. 6. Skin/Wound Care: Routine skin care checks 7. Fluids/Electrolytes/Nutrition: Routine Is and Os and follow-up chemistries 8: Pontine stroke:              Brilinta 90 mg twice daily plus aspirin 81 mg daily for 1 month             Then Plavix 75 mg daily plus aspirin 81 mg daily for 2 months Then aspirin 81 mg daily only Continue statin 30 day cardiac event monitor at discharge 9: Hypertension: Continue amlodipine 2.5 mg daily. Holding hydralazine --home regimen: hydralazine 50 mg TID and amlodipine 10 mg daily             --Avoid low blood pressure given basilar artery stenosis             - BP has been essentially normal off meds will resume as needed starting with amlodipine  5/20 Some elevation in Bp, peak today 154/84, pt was not on any anti-hypertensives.  Start 2.5 mg amlodipine daily and trend. 5/21 B/P this am 115/80 on 2.5 mg amlodipine dose, continue and trend b/p Vitals:    04/20/22 2018 04/21/22 0336  BP: (!) 157/78 115/80  Pulse: 69 61  Resp: 17 18  Temp: 97.8 F (36.6 C) 98.5 F (36.9 C)  SpO2: 99% 98%    10:  Graves disease s/p RAI/acquired hypothyroidism: continue Synthroid 11: Prediabetes: follow-up PCP/[facility]            -Glucose appears in good range on BMP, follow 12: Hyperlipidemia: continue Crestor 20 mg daily 13: CKD 3a: elevated creatinine 1.26 (?? baseline); follow-up BMP 5/20 Slightly elevated Crt, continue to trend with qMonday labs. 5/21 Pending qMonday labs    Latest Ref Rng & Units 04/17/2022    6:24 AM 04/13/2022    1:03 AM 04/12/2022    8:20 AM  BMP  Glucose 70 - 99 mg/dL 95   93     BUN 8 - 23 mg/dL 19   15     Creatinine 0.61 - 1.24 mg/dL 6.56   8.12   7.51    Sodium 135 - 145 mmol/L 137   136     Potassium 3.5 - 5.1 mmol/L 4.6   3.7     Chloride 98 - 111 mmol/L 106   110     CO2 22 - 32 mmol/L 25   19     Calcium 8.9 - 10.3 mg/dL 9.2   8.4       14: Vitamin B12 deficiency: continue 1,000 mcg daily 15. Constipation, senokot , sorbitol PRN             -Improved with BM today             -5/20 LBM 5/19, continue bowel regimen              5/21 LBM 5/19, use prn's if no BM today  LOS: 5 days A FACE TO FACE EVALUATION WAS PERFORMED  Tressia Miners 04/21/2022, 11:01 AM

## 2022-04-21 NOTE — Progress Notes (Signed)
Occupational Therapy Session Note  Patient Details  Name: Ivan Lawson MRN: [id number] Date of Birth: 1956-11-01  Today's Date: 04/21/2022 OT Individual Time: 0900-1000 OT Individual Time Calculation (min): 60 min    Short Term Goals: Week 1:  OT Short Term Goal 1 (Week 1): STG = LTG 2/2 ELOS  Skilled Therapeutic Interventions/Progress Updates:    Pt received sitting up with c/o mild soreness in his R hand but no need for any intervention, agreeable to OT session. He donned a new shirt with (S), pants with (S) from recliner sit <> stand. Cueing for attempting figure 4 technique instead of bending forward to don socks and shoes- pt able to do so with (S) overall. He completed toileting tasks with (S) overall, CGA for ambulatory transfer into the bathroom. Oral care in standing with (S). He completed functional mobility to the therapy gym with CGA, no AD. He used the BITS to work on [employer] functional reaching, coordination, and accuracy with single finger isolation- rotator task to challenge moving target, 90% first trial and 100% second. He then completed blocked practice functional reach with a single target reach task- 3 min with 74 repetitions to reach high repetition/high intensity practice for NMR. He then completed squat with single overhead RUE hold holding a 2lb ball. He demonstrated difficulty dissociating UE/LE movement and required mod cueing and min facilitation to complete 3x8 repetitions. Education provided on CVA recovery and RUE NMR. He returned to his room and was left sitting up with all needs met, chair alarm set.    Therapy Documentation Precautions:  Precautions Precautions: Fall Precaution Comments: R hemipareisis UE>LE, very mild ataxia Restrictions Weight Bearing Restrictions: No   Therapy/Group: Individual Therapy  Curtis Sites 04/21/2022, 7:28 AM

## 2022-04-22 ENCOUNTER — Encounter (HOSPITAL_COMMUNITY): Payer: Self-pay | Admitting: Physical Medicine & Rehabilitation

## 2022-04-22 ENCOUNTER — Other Ambulatory Visit: Payer: Self-pay

## 2022-04-22 NOTE — Progress Notes (Signed)
Occupational Therapy Discharge Summary  Patient Details  Name: READE TREFZ MRN: [id number] Date of Birth: [birth year]   Patient has met {NUMBERS 0-12:18577} of {NUMBERS 0-12:18577} long term goals due to {due to:3041651}.  Patient to discharge at overall {LOA:3049010} level.  Patient's care partner {care partner:3041650} to provide the necessary {assistance:3041652} assistance at discharge.  Pt to DC at the below ADL/bathroom transfer performance level. Assist levels represent pt's most consistent performance when alert/participatory. Pt continues to be primarily limited by ***. Family education *** completed on *** with caregivers demonstrating ***. Pt and caregivers will benefit from continued *** OT to facilitate improved caregiver education, functional mobility, and occupational performance. ***   Reasons goals not met: ***  Recommendation:  Patient will benefit from ongoing skilled OT services in {setting:3041680} to continue to advance functional skills in the area of {ADL/iADL:3041649}.  Equipment: No equipment provided  Reasons for discharge: {Reason for discharge:3049018}  Patient/family agrees with progress made and goals achieved: {Pt/Family agree with progress/goals:3049020}  OT Discharge Precautions/Restrictions    General   Vital Signs Therapy Vitals Temp: 98.3 F (36.8 C) Pulse Rate: 65 Resp: 18 BP: 132/71 Patient Position (if appropriate): Lying Oxygen Therapy SpO2: 98 % O2 Device: Room Air Pain Pain Assessment Pain Scale: 0-10 Pain Score: 0-No pain ADL ADL Eating: Set up Where Assessed-Eating: Chair Grooming: Setup Where Assessed-Grooming: Sitting at sink Upper Body Bathing: Supervision/safety Where Assessed-Upper Body Bathing: Sitting at sink Lower Body Bathing: Minimal assistance Where Assessed-Lower Body Bathing: Standing at sink Upper Body Dressing: Supervision/safety Where Assessed-Upper Body Dressing: Sitting at sink Lower Body Dressing:  Minimal assistance Where Assessed-Lower Body Dressing: Chair, Sitting at sink Toileting: Minimal assistance Where Assessed-Toileting: Glass blower/designer: Psychiatric nurse Method: Counselling psychologist: Energy manager: Not assessed Social research officer, government: Not assessed Vision Baseline Vision/History: 1 Wears glasses (readers) Patient Visual Report: No change from baseline Vision Assessment?: No apparent visual deficits Perception  Perception: Within Functional Limits Praxis Praxis: Intact Cognition Cognition Overall Cognitive Status: Within Functional Limits for tasks assessed Arousal/Alertness: Awake/alert Orientation Level: Person;Place;Situation Person: Oriented Place: Oriented Situation: Oriented Memory: Appears intact Attention: Selective Awareness: Appears intact Problem Solving: Appears intact Safety/Judgment: Appears intact Comments: Good insight into deficits and taking care to avoid safety hazards/carry over of safety precautions learned in therapy. Brief Interview for Mental Status (BIMS) Repetition of Three Words (First Attempt): 3 Temporal Orientation: Year: Correct Temporal Orientation: Month: Accurate within 5 days Temporal Orientation: Day: Correct Recall: "Sock": Yes, no cue required Recall: "Blue": Yes, no cue required Recall: "Bed": Yes, no cue required BIMS Summary Score: 15 Sensation Sensation Light Touch: Impaired Detail Peripheral sensation comments: R thumb/toe numbness/ tingling Light Touch Impaired Details: Impaired RUE;Impaired RLE Hot/Cold: Appears Intact Proprioception: Appears Intact Stereognosis: Appears Intact Coordination Gross Motor Movements are Fluid and Coordinated: Yes Fine Motor Movements are Fluid and Coordinated: No Coordination and Movement Description: R hemipareisis UE>LE, very mild ataxia, imrpoved from eval Finger Nose Finger Test: mild ataxia on R, improved from  eval 9 Hole Peg Test: R: 51 seconds, improved from 3 min 33 seconds on eval Motor    Mobility     Trunk/Postural Assessment     Balance   Extremity/Trunk Assessment       Volanda Napoleon MS, OTR/L  04/23/2022, 7:54 AM

## 2022-04-22 NOTE — Progress Notes (Signed)
PROGRESS NOTE  Slept well, has had right thumb numbness in ams even prior to CVA Discussed analgesic use for joint pain, rec no NSAIDs ok for tylenol  ROS- neg CP, SOB, N/V/D  Objective:   No results found. No results for input(s): WBC, HGB, HCT, PLT in the last 72 hours. No results for input(s): NA, K, CL, CO2, GLUCOSE, BUN, CREATININE, CALCIUM in the last 72 hours.  Intake/Output Summary (Last 24 hours) at 04/22/2022 0745 Last data filed at 04/22/2022 0541 Gross per 24 hour  Intake 480 ml  Output 1000 ml  Net -520 ml         Physical Exam: Vital Signs Blood pressure 114/78, pulse 66, temperature 97.7 F (36.5 C), resp. rate 17, height 6' (1.829 m), weight 96.9 kg, SpO2 96 %.   General: No acute distress Mood and affect are appropriate Heart: Regular rate and rhythm no rubs murmurs or extra sounds Lungs: Clear to auscultation, breathing unlabored, no rales or wheezes Abdomen: Positive bowel sounds, soft nontender to palpation, nondistended Extremities: No clubbing, cyanosis, or edema Skin: No evidence of breakdown, no evidence of rash Neurologic: speech without dysarthria  Note:  Patient can touch right thumb to each finger of right hand.  Has good gross motor control of right hand but has reduced fine motor control of right hand compared to left. Speech is improving. Gross motor 4/5 on RIght side 5/5 on left side   Sensory: Normal to LT and proprioception BUE/BLE. Musculoskeletal: Full range of motion in all 4 extremities. No joint swelling   Assessment/Plan: 1. Functional deficits which require 3+ hours per day of interdisciplinary therapy in a comprehensive inpatient rehab setting. Physiatrist is providing close team supervision and 24 hour management of active medical problems listed below. Physiatrist and rehab team continue to assess barriers to discharge/monitor patient progress toward functional and medical  goals  Care Tool:  Bathing  Bathing activity did not occur: Refused           Bathing assist       Upper Body Dressing/Undressing Upper body dressing   What is the patient wearing?: Pull over shirt    Upper body assist Assist Level: Supervision/Verbal cueing    Lower Body Dressing/Undressing Lower body dressing      What is the patient wearing?: Underwear/pull up     Lower body assist Assist for lower body dressing: Moderate Assistance - Patient 50 - 74%     Toileting Toileting    Toileting assist Assist for toileting: Moderate Assistance - Patient 50 - 74%     Transfers Chair/bed transfer  Transfers assist     Chair/bed transfer assist level: Contact Guard/Touching assist     Locomotion Ambulation   Ambulation assist      Assist level: Contact Guard/Touching assist Assistive device: No Device Max distance: >200 ft   Walk 10 feet activity   Assist     Assist level: Contact Guard/Touching assist Assistive device: No Device   Walk 50 feet activity   Assist    Assist level: Contact Guard/Touching assist Assistive device: No Device    Walk 150 feet activity   Assist    Assist level:  Contact Guard/Touching assist Assistive device: No Device    Walk 10 feet on uneven surface  activity   Assist     Assist level: Minimal Assistance - Patient > 75% Assistive device: Other (comment) (no AD)   Wheelchair     Assist Is the patient using a wheelchair?: No   Wheelchair activity did not occur: N/A         Wheelchair 50 feet with 2 turns activity    Assist    Wheelchair 50 feet with 2 turns activity did not occur: N/A       Wheelchair 150 feet activity     Assist  Wheelchair 150 feet activity did not occur: N/A       Blood pressure 114/78, pulse 66, temperature 97.7 F (36.5 C), resp. rate 17, height 6' (1.829 m), weight 96.9 kg, SpO2 96 %.  Medical Problem List and Plan: 1. Functional deficits  secondary to extension of left paramedian pontine infarct likely secondary to basilar artery stenosis             -patient may shower             -ELOS/Goals: 12 days Min A             -Admit to CIR PT, OT, add SLP for dysarthria eval  2.  Antithrombotics: -DVT/anticoagulation:  Pharmaceutical: Lovenox             -antiplatelet therapy: aspirin 81 mg daily and brilinta              5/21 Continue ASA and brillinta 3. Pain Management: Tylenol prn, may use diclofenac gel , no oral NSAIDs  4. Mood: LCSW to evaluate and provide emotional support             -antipsychotic agents: na 5. Neuropsych: This patient is capable of making decisions on his own behalf. 6. Skin/Wound Care: Routine skin care checks 7. Fluids/Electrolytes/Nutrition: Routine Is and Os and follow-up chemistries 8: Pontine stroke:              Brilinta 90 mg twice daily plus aspirin 81 mg daily for 1 month             Then Plavix 75 mg daily plus aspirin 81 mg daily for 2 months Then aspirin 81 mg daily only Continue statin 30 day cardiac event monitor at discharge 9: Hypertension: Continue amlodipine 2.5 mg daily. Holding hydralazine --home regimen: hydralazine 50 mg TID and amlodipine 10 mg daily             --Avoid low blood pressure given basilar artery stenosis             - BP has been essentially normal off meds will resume as needed starting with amlodipine  5/20 Some elevation in Bp, peak today 154/84, pt was not on any anti-hypertensives.  Start 2.5 mg amlodipine daily and trend. 5/21 B/P this am 115/80 on 2.5 mg amlodipine dose, continue and trend b/p Vitals:   04/21/22 2030 04/22/22 0540  BP: (!) 135/93 114/78  Pulse: 70 66  Resp: 18 17  Temp: 97.7 F (36.5 C) 97.7 F (36.5 C)  SpO2: 98% 96%  Controlled 5/22  10: Graves disease s/p RAI/acquired hypothyroidism: continue Synthroid 11: Prediabetes: follow-up PCP/[facility]            -Glucose appears in good range on BMP, follow 12:  Hyperlipidemia: continue Crestor 20 mg daily 13: CKD 3a: elevated creatinine 1.26 (?? baseline); follow-up BMP  5/20 Slightly elevated Crt, continue to trend with qMonday labs. 5/21 Pending qMonday labs    Latest Ref Rng & Units 04/17/2022    6:24 AM 04/13/2022    1:03 AM 04/12/2022    8:20 AM  BMP  Glucose 70 - 99 mg/dL 95   93     BUN 8 - 23 mg/dL 19   15     Creatinine 0.61 - 1.24 mg/dL 7.40   8.14   4.81    Sodium 135 - 145 mmol/L 137   136     Potassium 3.5 - 5.1 mmol/L 4.6   3.7     Chloride 98 - 111 mmol/L 106   110     CO2 22 - 32 mmol/L 25   19     Calcium 8.9 - 10.3 mg/dL 9.2   8.4       14: Vitamin B12 deficiency: continue 1,000 mcg daily 15. Constipation, senokot , sorbitol PRN             BM this am 5/22  LOS: 6 days A FACE TO FACE EVALUATION WAS PERFORMED  Erick Colace 04/22/2022, 7:45 AM

## 2022-04-22 NOTE — Plan of Care (Signed)
  Problem: Consults Goal: RH STROKE PATIENT EDUCATION Description: See Patient Education module for education specifics  Outcome: Progressing   Problem: RH BOWEL ELIMINATION Goal: RH STG MANAGE BOWEL WITH ASSISTANCE Description: STG Manage Bowel with  mod I Assistance. Outcome: Progressing Goal: RH STG MANAGE BOWEL W/MEDICATION W/ASSISTANCE Description: STG Manage Bowel with Medication with mod I  Assistance. Outcome: Progressing   Problem: RH SAFETY Goal: RH STG ADHERE TO SAFETY PRECAUTIONS W/ASSISTANCE/DEVICE Description: STG Adhere to Safety Precautions With cues Assistance/Device. Outcome: Progressing   Problem: RH PAIN MANAGEMENT Goal: RH STG PAIN MANAGED AT OR BELOW PT'S PAIN GOAL Description: At or below level 4 with prns Outcome: Progressing   Problem: RH KNOWLEDGE DEFICIT Goal: RH STG INCREASE KNOWLEDGE OF DIABETES Description: Patient will be able to manage DM with medications and dietary modifications using handouts and educational resources independently Outcome: Progressing Goal: RH STG INCREASE KNOWLEDGE OF HYPERTENSION Description: Patient will be able to manage HTN with medications and dietary modifications using handouts and educational resources independently Outcome: Progressing Goal: RH STG INCREASE KNOWLEGDE OF HYPERLIPIDEMIA Description: Patient will be able to manage HLD with medications and dietary modifications using handouts and educational resources independently Outcome: Progressing Goal: RH STG INCREASE KNOWLEDGE OF STROKE PROPHYLAXIS Description: Patient will be able to manage secondary stroke risks with medications and dietary modifications using handouts and educational resources independently Outcome: Progressing

## 2022-04-22 NOTE — Progress Notes (Incomplete)
Physical Therapy Session Note  Patient Details  Name: Ivan Lawson MRN: [id number] Date of Birth: 1956/03/10  Today's Date: 04/22/2022 PT Individual Time: 7322-0254 and 1102-1200 and 2706-2376 PT Individual Time Calculation (min): 44 min and 58 min and 46 min  Short Term Goals: Week 1:  PT Short Term Goal 1 (Week 1): Pt will perform bed mobility with consistent supervision/ IND and no use of bed features. PT Short Term Goal 2 (Week 1): Pt will perform all functional transfers with no AD and consistent supervision. PT Short Term Goal 3 (Week 1): Pt will ambulate >300 ft with no AD and consistent supervision and decreased need for rest breaks. PT Short Term Goal 4 (Week 1): Pt will complete at least 6 steps with supervision and no cueing for proper technique. PT Short Term Goal 5 (Week 1): Pt will improve score on TUG by at least 5 seconds to demonstrate improved fall risk. Week 2:     Skilled Therapeutic Interventions/Progress Updates:  Session 1:  Patient seated upright in recliner on entrance to room. Patient alert and agreeable to PT session.   Patient with no pain complaint throughout session.  Therapeutic Activity: Transfers: Patient performed sit<>stand and stand pivot transfers throughout session with supervision improving from use of hands on knees/ thighs to no UE support. No cueing required. On first rise to stand for day, pt relates mild increase in effort needed.   Gait Training/NMR:  Patient ambulated >200 ft x2 using no AD with suoervision. Demonstrated continued increased lateral lean with each step into stance phase. Decreased lateral lean noted on return trip to room. Performs pivot step for turn to R with no LOB.   NMR facilitated during session with focus on quality of gait. Pt guided in amb with focus on lumbopelvic dissociation to smooth LE advancement and on headturns during ambulation. Pt is able to smooth gait pattern with improved advancement of Le with forward  placement of step with increased pelvic lateral rotation. All occurs following education and visual demonstration of pt's current stepping pattern and expected pattern. Use of mirror alllows pt to see difference during gait and make appropriate changes.  Guided in head turns with no deviation in path with head tilt downward, but deviation noted with tilt upward and with lateral head turns. Guided practice with light improvement. Will require add'l training to continue to progress away from potential LOB. NMR performed for improvements in motor control and coordination, balance, sequencing, judgement, and self confidence/ efficacy in performing all aspects of mobility at highest level of independence.   Patient seated upright in recliner at end of session with brakes locked, bed alarm set, and all needs within reach.  Session 2: Patient seated upright in recliner on entrance to room.Patient alert and agreeable to PT session.   Patient with no pain complaint throughout session.  Therapeutic Activity: Transfers: Patient performed sit<>stand and stand pivot transfers throughout session with supervision and no UE support.  Gait Training/NMR:  Patient ambulated >170 ft into main therapy gym to retrieve ball, then amb into elevator and down to [facility] entrance. Pt guided in outdoor ambulation over uneven surfaces. Guided in amb with bounce of ball to self>200 ft ith no missteps or LOB. Close supervision provided.   Amb outside with no AD down ramp of circular driveway, down steps at [facility], across driveway and up steps to heart and vasc center and across patio with close SUP. After seated rest break on bench, return trip in opposite direction  to same bench. Provided vc/ tc for increased step height/ length when walking up ramped entrance.  NMR performed for improvements in motor control and coordination, balance, sequencing, judgement, and self confidence/ efficacy in performing all aspects of mobility at  highest level of independence.   Patient seated in recliner at end of session with brakes locked, seat pad alarm set, and all needs within reach.  Session 3 Patient seated upright in recliner on entrance to room.Patient alert and agreeable to PT session.   Patient with no pain complaint throughout session.  Therapeutic Activity: Transfers: Patient performed sit<>stand and stand pivot transfers throughout session with supervision.   Floor transfer practiced at end of session with pt able to take time and following instructions on reach floor and then provided with steps to return to quadriped then use of mat table for UE support to return to upright stance. Requires vc throughout but performs with close supervision.   Gait Training:  Patient ambulated >220' x2 using no AD with close supervision.   Neuromuscular Re-ed: NMR facilitated during session with focus on dynamic step in standing balance. Pt guided in bean bag toss to target. No LOB noted throughout. Shorter time for practice/ learning each side for coordinated stp and toss. Performed with supervision and no need for CGA this session. Improved performance overall. NMR performed for improvements in motor control and coordination, balance, sequencing, judgement, and self confidence/ efficacy in performing all aspects of mobility at highest level of independence.   Patient seated  in ecliner at end of session with brakes locked, seat pad alarm set, and all needs within reach.  Therapy Documentation Precautions:  Precautions Precautions: Fall Precaution Comments: R hemipareisis UE>LE, very mild ataxia Restrictions Weight Bearing Restrictions: No General:   Vital Signs:   Pain: Pain Assessment Pain Scale: 0-10 Pain Score: 0-No pain  Therapy/Group: Individual Therapy  Loel Dubonnet PT, DPT 04/22/2022, 10:26 AM

## 2022-04-22 NOTE — Patient Instructions (Signed)
  Coordination Activities  Perform the following activities for 15-30 minutes 1-2 times per day with right hand(s).  Flip cards 1 at a time. Start slow then increase speed when able.  Deal cards with your thumb (Hold deck in hand and push card off top with thumb). Rotate card in hand (clockwise and counter-clockwise). Shuffle cards. Twirl pen between fingers.   Theraputty Home Exercise Program  Complete 1-2 times a day.  putty squeeze  Pt. should squeeze putty in hand trying to keep it round by rotating putty after each squeeze. push fingers through putty to palm each time. Complete for __2-3____ minutes.   PUTTY KEY GRIP  Hold the putty at the top of your hand. Squeeze the putty between your thumb and the side of your 2nd finger as shown. Complete for ___2-3_____ minutes.    PUTTY 3 JAW CHUCK  Roll up some putty into a ball then flatten it. Then, firmly squeeze it with your first 3 fingers as shown. Complete for ___2-3___ minutes.

## 2022-04-22 NOTE — Progress Notes (Signed)
Occupational Therapy Session Note  Patient Details  Name: Ivan Lawson MRN: [id number] Date of Birth: [birth year]  Today's Date: 04/22/2022 OT Individual Time: 0930-1030 OT Individual Time Calculation (min): 60 min    Short Term Goals: Week 1:  OT Short Term Goal 1 (Week 1): STG = LTG 2/2 ELOS  Skilled Therapeutic Interventions/Progress Updates:    S: no pain reported.  O: - Fine motor: Card flip using deck of cards, dealing cards with thumb, card shuffle, rotate one card 5X clockwise then 5X counterclock wise.  - Fine Motor: Created six towers total of 3 square and cylinder sponge stacks using his right hand.  - Hand strengthening: utilized red resistive clothespin and a 3 point pinch to pick up and transfer 20 small sponges from table to lid of container. Red resistive clothespin utilized with a 3 point pinch to pick up sponges and create six towers of 3 sponges.  -  Hand strengthening: 6 large beads with gripper set at 6.6# using a  horizontal grasp. 5 medium square beads and 5 cylinder beads with gripper set at 6.6# using a horizontal grasp.  A: Focused session on NM-re-ed of the RUE including fine motor coordination and hand strengthening tasks. Moderate difficulty noted with card manipulation tasks. During card flip, pt demonstrated  difficulty with placing discarded card above the card deck due to wrist flexion weakness. VC for form and technique were provided. HEP provided focusing on fine motor coordination and hand strengthening with theraputty. Pt already has red putty in his room. Noted greater difficulty maintaining functional grasp around items when not focused on his hand (ie. He takes his eyes off of what he is doing).   P: Continue to focus on fine motor coordination and strength of the RUE.   Therapy Documentation Precautions:  Precautions Precautions: Fall Precaution Comments: R hemipareisis UE>LE, very mild ataxia Restrictions Weight Bearing Restrictions:  No  Pain: Pain Assessment Pain Scale: 0-10 Pain Score: 0-No pain    Therapy/Group: Individual Therapy  Limmie Patricia, OTR/L,CBIS  04/22/2022, 10:25 AM

## 2022-04-23 ENCOUNTER — Telehealth (HOSPITAL_BASED_OUTPATIENT_CLINIC_OR_DEPARTMENT_OTHER): Payer: Self-pay

## 2022-04-23 ENCOUNTER — Other Ambulatory Visit (HOSPITAL_BASED_OUTPATIENT_CLINIC_OR_DEPARTMENT_OTHER): Payer: Self-pay

## 2022-04-23 LAB — CBC
HCT: 44.3 % (ref 39.0–52.0)
Hemoglobin: 15 g/dL (ref 13.0–17.0)
MCH: 29.6 pg (ref 26.0–34.0)
MCHC: 33.9 g/dL (ref 30.0–36.0)
MCV: 87.5 fL (ref 80.0–100.0)
Platelets: 225 10*3/uL (ref 150–400)
RBC: 5.06 MIL/uL (ref 4.22–5.81)
RDW: 13 % (ref 11.5–15.5)
WBC: 6.6 10*3/uL (ref 4.0–10.5)
nRBC: 0 % (ref 0.0–0.2)

## 2022-04-23 LAB — BASIC METABOLIC PANEL
Anion gap: 7 (ref 5–15)
BUN: 18 mg/dL (ref 8–23)
CO2: 27 mmol/L (ref 22–32)
Calcium: 9.4 mg/dL (ref 8.9–10.3)
Chloride: 105 mmol/L (ref 98–111)
Creatinine, Ser: 1.47 mg/dL — ABNORMAL HIGH (ref 0.61–1.24)
GFR, Estimated: 52 mL/min — ABNORMAL LOW (ref >60)
Glucose, Bld: 91 mg/dL (ref 70–99)
Potassium: 5.2 mmol/L — ABNORMAL HIGH (ref 3.5–5.1)
Sodium: 139 mmol/L (ref 135–145)

## 2022-04-23 NOTE — Progress Notes (Signed)
PROGRESS NOTE  No issues overnite , carries laundry basket with supervision from OT  ROS- neg CP, SOB, N/V/D  Objective:   No results found. Recent Labs    04/23/22 0521  WBC 6.6  HGB 15.0  HCT 44.3  PLT 225   Recent Labs    04/23/22 0521  NA 139  K 5.2*  CL 105  CO2 27  GLUCOSE 91  BUN 18  CREATININE 1.47*  CALCIUM 9.4    Intake/Output Summary (Last 24 hours) at 04/23/2022 0752 Last data filed at 04/23/2022 0600 Gross per 24 hour  Intake 604 ml  Output 1950 ml  Net -1346 ml         Physical Exam: Vital Signs Blood pressure 132/71, pulse 65, temperature 98.3 F (36.8 C), resp. rate 18, height 6' (1.829 m), weight 96.9 kg, SpO2 98 %.   General: No acute distress Mood and affect are appropriate Heart: Regular rate and rhythm no rubs murmurs or extra sounds Lungs: Clear to auscultation, breathing unlabored, no rales or wheezes Abdomen: Positive bowel sounds, soft nontender to palpation, nondistended Extremities: No clubbing, cyanosis, or edema Skin: No evidence of breakdown, no evidence of rash Neurologic: speech without dysarthria  Note:  Patient can touch right thumb to each finger of right hand.  Has good gross motor control of right hand but has reduced fine motor control of right hand compared to left. Speech is improving. Gross motor 4/5 on RIght side 5/5 on left side   Sensory: Normal to LT and proprioception BUE/BLE. Musculoskeletal: Full range of motion in all 4 extremities. No joint swelling   Assessment/Plan: 1. Functional deficits which require 3+ hours per day of interdisciplinary therapy in a comprehensive inpatient rehab setting. Physiatrist is providing close team supervision and 24 hour management of active medical problems listed below. Physiatrist and rehab team continue to assess barriers to discharge/monitor patient progress toward functional and medical goals  Care  Tool:  Bathing  Bathing activity did not occur: Refused           Bathing assist       Upper Body Dressing/Undressing Upper body dressing   What is the patient wearing?: Pull over shirt    Upper body assist Assist Level: Supervision/Verbal cueing    Lower Body Dressing/Undressing Lower body dressing      What is the patient wearing?: Underwear/pull up     Lower body assist Assist for lower body dressing: Moderate Assistance - Patient 50 - 74%     Toileting Toileting    Toileting assist Assist for toileting: Moderate Assistance - Patient 50 - 74%     Transfers Chair/bed transfer  Transfers assist     Chair/bed transfer assist level: Contact Guard/Touching assist     Locomotion Ambulation   Ambulation assist      Assist level: Contact Guard/Touching assist Assistive device: No Device Max distance: >200 ft   Walk 10 feet activity   Assist     Assist level: Contact Guard/Touching assist Assistive device: No Device   Walk 50 feet activity   Assist    Assist level: Contact Guard/Touching assist Assistive device: No Device    Walk 150  feet activity   Assist    Assist level: Contact Guard/Touching assist Assistive device: No Device    Walk 10 feet on uneven surface  activity   Assist     Assist level: Minimal Assistance - Patient > 75% Assistive device: Other (comment) (no AD)   Wheelchair     Assist Is the patient using a wheelchair?: No   Wheelchair activity did not occur: N/A         Wheelchair 50 feet with 2 turns activity    Assist    Wheelchair 50 feet with 2 turns activity did not occur: N/A       Wheelchair 150 feet activity     Assist  Wheelchair 150 feet activity did not occur: N/A       Blood pressure 132/71, pulse 65, temperature 98.3 F (36.8 C), resp. rate 18, height 6' (1.829 m), weight 96.9 kg, SpO2 98 %.  Medical Problem List and Plan: 1. Functional deficits secondary to  extension of left paramedian pontine infarct likely secondary to basilar artery stenosis             -patient may shower             -ELOS/Goals: 12 days Min A, team conf in am              -Admit to CIR PT, OT, add SLP for dysarthria eval  2.  Antithrombotics: -DVT/anticoagulation:  Pharmaceutical: Lovenox             -antiplatelet therapy: aspirin 81 mg daily and brilinta              5/21 Continue ASA and brillinta 3. Pain Management: Tylenol prn, may use diclofenac gel , no oral NSAIDs  4. Mood: LCSW to evaluate and provide emotional support             -antipsychotic agents: na 5. Neuropsych: This patient is capable of making decisions on his own behalf. 6. Skin/Wound Care: Routine skin care checks 7. Fluids/Electrolytes/Nutrition: Routine Is and Os and follow-up chemistries 8: Pontine stroke:              Brilinta 90 mg twice daily plus aspirin 81 mg daily for 1 month             Then Plavix 75 mg daily plus aspirin 81 mg daily for 2 months Then aspirin 81 mg daily only Continue statin 30 day cardiac event monitor at discharge 9: Hypertension: Continue amlodipine 2.5 mg daily. Holding hydralazine --home regimen: hydralazine 50 mg TID and amlodipine 10 mg daily             --Avoid low blood pressure given basilar artery stenosis             - BP has been essentially normal off meds will resume as needed starting with amlodipine  5/20 Some elevation in Bp, peak today 154/84, pt was not on any anti-hypertensives.  Start 2.5 mg amlodipine daily and trend. 5/21 B/P this am 115/80 on 2.5 mg amlodipine dose, continue and trend b/p Vitals:   04/22/22 1924 04/23/22 0545  BP: (!) 151/87 132/71  Pulse: 64 65  Resp: 16 18  Temp: (!) 97.5 F (36.4 C) 98.3 F (36.8 C)  SpO2: 100% 98%   Controlled 5/23  10: Graves disease s/p RAI/acquired hypothyroidism: continue Synthroid 11: Prediabetes: follow-up PCP/[facility]            -Glucose appears in good range on BMP,  follow  12: Hyperlipidemia: continue Crestor 20 mg daily 13: CKD 3a: elevated creatinine 1.26 (?? baseline); follow-up BMP 5/20 Slightly elevated Crt, continue to trend with qMonday labs. 5/21 Pending qMonday labs    Latest Ref Rng & Units 04/23/2022    5:21 AM 04/17/2022    6:24 AM 04/13/2022    1:03 AM  BMP  Glucose 70 - 99 mg/dL 91   95   93    BUN 8 - 23 mg/dL 18   19   15     Creatinine 0.61 - 1.24 mg/dL   0.60   1.56    Sodium 135 - 145 mmol/L 139   137   136    Potassium 3.5 - 5.1 mmol/L 5.2   4.6   3.7    Chloride 98 - 111 mmol/L 105   106   110    CO2 22 - 32 mmol/L 27   25   19     Calcium 8.9 - 10.3 mg/dL 9.4   9.2   8.4      14: Vitamin B12 deficiency: continue 1,000 mcg daily 15. Constipation, senokot , sorbitol PRN             BM this am 5/22 16.  Mild Hyper K isolated reading recheck in am , creat trending up , repeat in am  LOS: 7 days A FACE TO FACE EVALUATION WAS PERFORMED  04/23/2022, 7:52 AM

## 2022-04-23 NOTE — Progress Notes (Signed)
Occupational Therapy Session Note  Patient Details  Name: Ivan Lawson MRN: [id number] Date of Birth: 1956-03-30  Today's Date: 04/23/2022 OT Individual Time: 0702-0800 OT Individual Time Calculation (min): 58 min +  60 min   Short Term Goals: Week 1:  OT Short Term Goal 1 (Week 1): STG = LTG 2/2 ELOS  Skilled Therapeutic Interventions/Progress Updates:    Session 1 (0702-0800) : Pt received seated in recliner, no c/o pain, agreeable to therapy. Session focus on self-care retraining, activity tolerance, IADL retraining, RUE NMR in prep for improved ADL/IADL/func mobility performance + decreased caregiver burden. Pt completed oral care in standing distant S, ind for changing out shirt. Ambulated to and from gym with close S throughout session due to increased distance.  Re-administered the Nine Hole Peg Test (NHPT)  with the following results: -R: 51 seconds, improved from 3 min 33 seconds on eval  (37-45 seconds is considered below average ; greater than 45 seconds is considered poor) thus demonstrating deficits in fine motor skills, hand dexterity, and speed.  In ADL apartment, pt completed step over shower transfer, supine <> sitting EOB in bed, recliner and sofa transfer all with distant S. Pt reports having shower seat/grab bars. Reviewed safe item transport/safe home set-up to keep items within easily accessible reach. Pt practiced transport plates/bowls/retrieving items from high/low cabinets and fridge. Finally, pt practiced carrying full laundry basket of towels to and from Day Room with CGA for balance. Able to fold towels/wash-cloths with increased time/BUE and carry back to room. MD in/out morning assessment.  Pt left seated in recliner with chair pad alarm engaged, call bell in reach, and all immediate needs met.    Session 2 [phone]): Pt received seated in recliner, no c/o pain, agreeable to therapy. Session focus on static/dynamic balance retraining, activity tolerance, RUE  NMR/FMC in prep for improved ADL/IADL/func mobility performance + decreased caregiver burden. Ambulated throughout session close S and no AD due to increased distance/minimal fatigue.  On Wii balance board, pt participated in various games to promote improved ankle strategy and WS, no LOB throughout.   Pt completed pill box test to assess R [facility] and ability to place/remove pills. Able to complete with increased time, only dropping / spilling pills x2. Discussed use of towel or medicine cup to decrease likelihood of spilling and dropping pills at home.  Finally, massed practice of palm > tip translation and rotational inhand manipulation skills with use of poker chip. Pt able to hold up to 5 chips in hand to complete translation.   Pt left seated in recliner with chair pad alarm engaged, call bell in reach, and all immediate needs met.    Therapy Documentation Precautions:  Precautions Precautions: Fall Precaution Comments: R hemipareisis UE>LE, very mild ataxia Restrictions Weight Bearing Restrictions: No  Pain:  no c/o throughout ADL: See Care Tool for more details.  Therapy/Group: Individual Therapy  Volanda Napoleon MS, OTR/L  04/23/2022, 6:54 AM

## 2022-04-23 NOTE — Telephone Encounter (Signed)
Pharmacy Transitions of Care Follow-up Telephone Call  Date of discharge: 04/08/22  Discharge Diagnosis: Cerebral thrombosis with cerebral infarction  How have you been since you were released from the hospital? Patient is currently readmitted into hospital but doing well. His medications will probably be changing, so we will schedule another follow up once he goes home.   Medication changes made at discharge: ASK your doctor about these medications  ASK your doctor about these medications  amLODipine 2.5 MG tablet Commonly known as: NORVASC Take 1 tablet (2.5 mg total) by mouth daily.  Aspirin Low Dose 81 MG tablet Generic drug: aspirin EC Take 1 tablet (81 mg total) by mouth daily. Swallow whole.  clopidogrel 75 MG tablet Commonly known as: PLAVIX Take 1 tablet (75 mg total) by mouth daily. Starting on 6/11, take for 60 days (2 months) Start taking on: May 12, 2022  levothyroxine 137 MCG tablet Commonly known as: SYNTHROID  rosuvastatin 20 MG tablet Commonly known as: CRESTOR Take 1 tablet (20 mg total) by mouth daily.  ticagrelor 90 MG Tabs tablet Commonly known as: BRILINTA Take 1 tablet (90 mg total) by mouth 2 (two) times daily for 26 days.  vitamin B-12 1000 MCG tablet Commonly known as: CYANOCOBALAMIN      Medication Accessibility:  Home Pharmacy: [facility] [facility]   Was the patient provided with refills on discharged medications? no   Have all prescriptions been transferred from [facility] to home pharmacy? N/a    Medication Review: CLOPIDOGREL (PLAVIX) Brilinta/Aspirin for 30 days, followed by Plavix/Aspirin for 60 days, followed by Aspirin indefinitely Clopidogrel 75 mg once daily.  - Educated patient on expected duration of therapy of 60 days with clopidogrel. Advised patient that aspirin will be continued indefinitely.  - Reviewed potential DDIs with patient  - Advised patient of medications to avoid (NSAIDs, ASA)  - Educated that Tylenol (acetaminophen) will be  the preferred analgesic to prevent risk of bleeding  - Emphasized importance of monitoring for signs and symptoms of bleeding (abnormal bruising, prolonged bleeding, nose bleeds, bleeding from gums, discolored urine, black tarry stools)  - Advised patient to alert all providers of anticoagulation therapy prior to starting a new medication or having a procedure   Follow-up Appointments: Date Visit Type Length Department    04/26/2022  9:00 AM NEW VASCULAR 20 min [facility] [phone]  Patient Instructions:            05/13/2022  2:15 PM HOSPITAL FU 30 min [facility] [id number]  Patient Instructions:     If their condition worsens, is the pt aware to call PCP or go to the Emergency Dept.? N/a  Final Patient Assessment:  Patient is currently readmitted into hospital but doing well. His medications will probably be changing, so we will schedule another follow up once he goes home.   Darcus Austin, [place] 04/23/2022 3:34 PM

## 2022-04-23 NOTE — Progress Notes (Signed)
Physical Therapy Session Note  Patient Details  Name: Ivan Lawson MRN: [id number] Date of Birth: Apr 19, 1956  Today's Date: 04/23/2022 PT Individual Time: 0904-1018 PT Individual Time Calculation (min): 74 min   Short Term Goals: Week 1:  PT Short Term Goal 1 (Week 1): Pt will perform bed mobility with consistent supervision/ IND and no use of bed features. PT Short Term Goal 2 (Week 1): Pt will perform all functional transfers with no AD and consistent supervision. PT Short Term Goal 3 (Week 1): Pt will ambulate >300 ft with no AD and consistent supervision and decreased need for rest breaks. PT Short Term Goal 4 (Week 1): Pt will complete at least 6 steps with supervision and no cueing for proper technique. PT Short Term Goal 5 (Week 1): Pt will improve score on TUG by at least 5 seconds to demonstrate improved fall risk.  Skilled Therapeutic Interventions/Progress Updates:  Patient seated upright in recliner on entrance to room. Patient alert and agreeable to PT session.   Patient with no pain complaint throughout session.  Therapeutic Activity: Transfers: Patient performed sit<>stand and stand pivot transfers throughout session with supervision/ Mod I. No vc required for technique.  Gait Training:  Patient ambulated >200 ft using no AD with supervision. Demonstrated improving lumbopelvic dissociation and slight improvement in R foot slap. Provided vc/ tc for improvements in quality of gait throughout. Guided in FGA with pt wanting to focus on performance of pivot stepping and stepping over objects. Coordination of pivot stepping difficult initially and improves slightly throughout with blocked practice. Patient demonstrates increased fall risk as noted by score of 23/30 on  Functional Gait Assessment.  Indicative of a moderate risk for falls.  <22/30 = predictive of falls, <20/30 = fall in 6 months, <18/30 = predictive of falls in PD MCID: 5 points stroke population, 4 points  geriatric population (ANPTA Core Set of Outcome Measures for Adults with Neurologic Conditions, 2018)   Neuromuscular Re-ed: NMR facilitated during session with focus onstanding balance. Pt guided in Berg Balance Test. Patient demonstrates increased fall risk as noted by score of   52/56 on Berg Balance Scale.  (<36= high risk for falls, close to 100%; 37-45 significant >80%; 46-51 moderate >50%; 52-55 lower >25%). NMR performed for improvements in motor control and coordination, balance, sequencing, judgement, and self confidence/ efficacy in performing all aspects of mobility at highest level of independence.   Patient seated  in recliner at end of session with brakes locked, belt alarm set, and all needs within reach.   Therapy Documentation Precautions:  Precautions Precautions: Fall Precaution Comments: R hemipareisis UE>LE, very mild ataxia Restrictions Weight Bearing Restrictions: No General:   Vital Signs:  Pain:  No pain complaint this session.  Therapy/Group: Individual Therapy  Loel Dubonnet PT, DPT 04/23/2022, 6:58 PM

## 2022-04-23 NOTE — Telephone Encounter (Signed)
Transitions of Care Pharmacy   Call attempted for a pharmacy transitions of care follow-up. HIPAA appropriate voicemail was left with call back information provided.   Call attempt #1. Will follow-up in 2-3 days.     Darcus Austin, [facility] 04/23/2022 12:44 PM

## 2022-04-24 LAB — BASIC METABOLIC PANEL
Anion gap: 9 (ref 5–15)
BUN: 20 mg/dL (ref 8–23)
CO2: 23 mmol/L (ref 22–32)
Calcium: 9.2 mg/dL (ref 8.9–10.3)
Chloride: 106 mmol/L (ref 98–111)
Creatinine, Ser: 1.4 mg/dL — ABNORMAL HIGH (ref 0.61–1.24)
GFR, Estimated: 55 mL/min — ABNORMAL LOW (ref >60)
Glucose, Bld: 93 mg/dL (ref 70–99)
Potassium: 4.4 mmol/L (ref 3.5–5.1)
Sodium: 138 mmol/L (ref 135–145)

## 2022-04-24 MED ORDER — DICLOFENAC SODIUM 1 % EX GEL
2.0000 g | Freq: Four times a day (QID) | CUTANEOUS | Status: DC
  Administered 2022-04-24 – 2022-04-25 (×3): 2 g via TOPICAL
  Filled 2022-04-24: qty 100

## 2022-04-24 NOTE — Progress Notes (Signed)
Physical Therapy Session Note  Patient Details  Name: TYJAI MATUSZAK MRN: [id number] Date of Birth: [birth year]  Today's Date: 04/24/2022 PT Individual Time: 0810-0905 PT Individual Time Calculation (min): 55 min   Short Term Goals: Week 1:  PT Short Term Goal 1 (Week 1): Pt will perform bed mobility with consistent supervision/ IND and no use of bed features. PT Short Term Goal 2 (Week 1): Pt will perform all functional transfers with no AD and consistent supervision. PT Short Term Goal 3 (Week 1): Pt will ambulate >300 ft with no AD and consistent supervision and decreased need for rest breaks. PT Short Term Goal 4 (Week 1): Pt will complete at least 6 steps with supervision and no cueing for proper technique. PT Short Term Goal 5 (Week 1): Pt will improve score on TUG by at least 5 seconds to demonstrate improved fall risk.   Skilled Therapeutic Interventions/Progress Updates:   Pt received sitting in recliner and agreeable to PT. Pt performed sit<>stand transfer without or cues  from PT . Ambulation in room with no AD to sink and bathroom for personal hygiene. No cues or assist from PT throughout.   Gait training in hall 2 x147f and 1268fwith distant supervision assist with oinly min cues for trunk and pelvic rotation.   Standing stretch for BLE: heeL cord and hip flexors 4 x 30 sec each BLE.   Stairs with UE support x 12 and no cues or assist from PT, pt noted to uilize step over step gait pattern. CGA without UE support due to RLE ataxia.   Pt performed 5 time sit<>stand (5xSTS): 13.03 sec (>15 sec indicates increased fall risk)   PT instructed pt in TUG: 12.49 sec (average of 3 trials (14.19, 11.5, 11.8; >13.5 sec indicates increased fall risk)  Dynamic balance training to toss ball of rebounder from level surface x 45sec. Standiing on airex x 45 sec and stand with blue wedge with forefoot only x 30 sec and entire foot x 30sec. Supervision assist for balance throughout with  cues for ankle strategy initially on wedge to prevent posterior LOB  Car transfer training without cues or assist from PT to SUV height.   Gait training on unlevel mulched surface in PT obstacle course as well as ramp x 12 with distant supervision assist from PT for stepping down curb step. .   Patient returned to room and performed stand pivot to recliner with no AD or assist. Pt left sitting in recliner with call bell in reach and all needs met.        Therapy Documentation Precautions:  Precautions Precautions: Fall Precaution Comments: R hemipareisis UE>LE, very mild ataxia Restrictions Weight Bearing Restrictions: No  Pain: Pain Assessment Pain Scale: 0-10 Pain Score: 2  Pain Type: Acute pain Pain Location: Finger (Comment which one) (index) Pain Orientation: Right Pain Descriptors / Indicators: Aching Pain Onset: With Activity Patients Stated Pain Goal: 0 Pain Intervention(s): MD notified (Comment) Multiple Pain Sites: No    Therapy/Group: Individual Therapy  AuLorie Phenix/24/2023, 9:06 AM

## 2022-04-24 NOTE — Patient Care Conference (Signed)
Inpatient RehabilitationTeam Conference and Plan of Care Update Date: 04/24/2022   Time: 10:17 AM    Patient Name: Ivan Lawson      Medical Record Number: [id number]  Date of Birth: 26-Jul-1956 Sex: Male         Room/Bed: 4M09C/4M09C-01 Payor Info: Payor: [employer] / Plan: [employer] PPO / Product Type: *No Product type* /    Admit Date/Time:  04/16/2022  2:54 PM  Primary Diagnosis:  Left pontine cerebrovascular accident [facility])  Hospital Problems: Principal Problem:   Left pontine cerebrovascular accident [facility])    Expected Discharge Date: Expected Discharge Date: 04/25/22  Team Members Present: Physician leading conference: Dr. Claudette Laws Social Worker Present: Lavera Guise, BSW Nurse Present: Chana Bode, RN PT Present: Grier Rocher, PT OT Present: Annye English, OT SLP Present: Eilene Ghazi, SLP PPS Coordinator present : Fae Pippin, SLP     Current Status/Progress Goal Weekly Team Focus  Bowel/Bladder   Bowel/bladder continent. Last BM 04/23/22.  remain continent.  Routine offering of assistance to bathroom/urinal.   Swallow/Nutrition/ Hydration             ADL's   mod I to ind with BADL/IADL, bathroom transfers improving RUE function  mod I  RUE NMR, transfer/balance/self-care retraining, activity tolerance   Mobility             Communication             Safety/Cognition/ Behavioral Observations            Pain   No c/o pain when asked.  remain pain free.  pain/discomfort assessment QS & PRN.   Skin   intact  remain intact  routine assessment/documentation; interventions PRN.Marland Kitchen     Discharge Planning:  Discharging home with assistance from brother able to provide up to Min A, 24/7 supervision avaliable   Team Discussion: Patient with arthritis of finger; voltaren gel added per MD. BP variable. Constipation addressed and MD monitoring elevated potassium.  Patient on target to meet rehab goals: yes, currently  patient needs mod I without an assistive device for ADLs. Mod I for transfers and distant supervision for gait.   *See Care Plan and progress notes for long and short-term goals.   Revisions to Treatment Plan:  SLP eval for dysarthria; service discontinued   Teaching Needs: Safety, medications, dietary modifications, etc.   Current Barriers to Discharge: Decreased caregiver support  Possible Resolutions to Barriers: OP Neuro follow up     Medical Summary Current Status: DIP 1st index on right , balance improving , still with mild Right HP  Barriers to Discharge: Other (comments)  Barriers to Discharge Comments: finger inflammation limiting Possible Resolutions to Barriers/Weekly Focus: Cont with laxative management , voltaren gel for Right index finger joint inflammation   Continued Need for Acute Rehabilitation Level of Care: The patient requires daily medical management by a physician with specialized training in physical medicine and rehabilitation for the following reasons: Direction of a multidisciplinary physical rehabilitation program to maximize functional independence : Yes Medical management of patient stability for increased activity during participation in an intensive rehabilitation regime.: Yes Analysis of laboratory values and/or radiology reports with any subsequent need for medication adjustment and/or medical intervention. : Yes   I attest that I was present, lead the team conference, and concur with the assessment and plan of the team.   Chana Bode B 04/24/2022, 3:49 PM

## 2022-04-24 NOTE — Progress Notes (Signed)
Occupational Therapy Session Note  Patient Details  Name: Ivan Lawson MRN: [id number] Date of Birth: December 25, 1955  Today's Date: 04/24/2022 OT Individual Time: 6237-6283 OT Individual Time Calculation (min): 62 min    Short Term Goals: Week 1:  OT Short Term Goal 1 (Week 1): STG = LTG 2/2 ELOS  Skilled Therapeutic Interventions/Progress Updates:    Pt received seated in recliner, agreeable to therapy. Session focus on RUE coordination, activity tolerance, dynamic standing balance in prep for improved ADL/IADL/func mobility performance + decreased caregiver burden.  Reports R 2nd digit arthritic pain this date, noted redness/swelling at DIP joint. Discussed ways to target RUE NMR/FMC without resistive therex during arthritic flare ups.  Pt ambulated throughout session distant S no AD due to increased distance in busy hospital environment. Pt able to locate 5/5 shopping list items with distant S in hospital gift shop.   Practiced various sit to stands and chair type transfers in prep for community reintegration including booth seat, bar stool, low bench, and outdoor bench.  Pt practiced diadochokinesia therac with alternating hands movements/increased time.   Pt requesting to practice stairs. Completed 22 steps with LUE on hand rail and overall close S to CGA. Reports minimal fatigue post activity and required quick seated rest break.   Finally, completed 2 rounds of large connect four game with 2 lb wrist weight on RUE. Able to retrieve discs from below knee height outside BOS. Close S for balance throughout.  Made ind in room and reviewed safety measures, pt to call if he feels he needs assistance and rec hav assistance with set-up for shower due to water tending to flood out into bathroom. Verbalized understanidng.   Pt left seated in recliner call bell in reach, and all immediate needs met.    Therapy Documentation Precautions:  Precautions Precautions: Fall Precaution  Comments: R hemipareisis UE>LE, very mild ataxia Restrictions Weight Bearing Restrictions: No  Pain: see session note   ADL: See Care Tool for more details.   Therapy/Group: Individual Therapy   Volanda Napoleon MS, OTR/L  04/24/2022, 6:51 AM

## 2022-04-24 NOTE — Progress Notes (Signed)
Patient ID: Ivan Lawson, male   DOB: 06/05/1956, 66 y.o.   MRN: [id number]  Team Conference Report to Patient/Family  Team Conference discussion was reviewed with the patient and caregiver, including goals, any changes in plan of care and target discharge date.  Patient and caregiver express understanding and are in agreement.  The patient has a target discharge date of 04/25/22.  Sw spoke with patient brother and informed him patient is medically ready for discharge. Brother informed that patient should be picked up between 9-11AM.   Dyanne Iha 04/24/2022, 1:55 PM

## 2022-04-24 NOTE — Progress Notes (Signed)
Physical Therapy Discharge Summary  Patient Details  Name: Ivan Lawson MRN: [id number] Date of Birth: 03/05/1956  Today's Date: 04/24/2022 PT Individual Time: 1630-1730 PT Individual Time Calculation (min): 60 min    Patient has met 9 of 9 long term goals due to improved activity tolerance, improved balance, improved postural control, increased strength, increased range of motion, ability to compensate for deficits, functional use of  right upper extremity and right lower extremity, and improved coordination.  Patient to discharge at an ambulatory level Independent.   Patient's care partner is independent to provide the necessary physical assistance at discharge.  Reasons goals not met: All PT goals met   Recommendation:  Patient will benefit from ongoing skilled PT services in outpatient setting to continue to advance safe functional mobility, address ongoing impairments in balance, coordination, endurance, safety, and minimize fall risk.  Equipment: No equipment provided  Reasons for discharge: treatment goals met and discharge from hospital  Patient/family agrees with progress made and goals achieved: Yes  PT treatment: Pt received sitting in recliner and agreeable to PT. Pt performed sit<>stand transfer with out AD or assist from PT .   PT instructed pt in Grad day assessment to measure progress toward goals. See below for details. CARETool mobility assessment  also completed; see CAREtool tab in navigator for details.  6 Min Walk Test:  Instructed patient to ambulate as quickly and as safely as possible for 6 minutes using LRAD. Patient was allowed to take standing rest breaks without stopping the test, but if the patient required a sitting rest break the clock would be stopped and the test would be over.  Results: 1128 feet (343 meters, Avg speed 0.64ms) using no AD or Assist. Results indicate that the patient has reduced endurance with ambulation compared to age matched  norms.  Age Matched Norms: 667-69yo M: 561F: 546 738-79yo M: 530F: 471, 843-89yo M: 427F: 392 MDC: 58.21 meters (190.98 feet) or 50 meters (ANPTA Core Set of Outcome Measures for Adults with Neurologic Conditions, 2018)  Dynamic balance training while engaged on Wii fit balance table tilt x 7 levels with distant supervision assist with cues initially only for improved ankle strategy to control COM. Patient returned to room and performed stand pivot to recliner with no AD or Assist . Pt left sitting in recliner with call bell in reach and all needs met.      PT Discharge Precautions/Restrictions Precautions Precautions: Fall Precaution Comments: R hemipareisis UE>LE, very mild ataxia Restrictions Weight Bearing Restrictions: No  Pain Pain Assessment Pain Scale: 0-10 Pain Score: 0-No pain Pain Interference Pain Interference Pain Effect on Sleep: 1. Rarely or not at all Pain Interference with Therapy Activities: 1. Rarely or not at all Pain Interference with Day-to-Day Activities: 1. Rarely or not at all Vision/Perception  Vision - History Ability to See in Adequate Light: 0 Adequate Vision - Assessment Eye Alignment: Within Functional Limits Ocular Range of Motion: Within Functional Limits Alignment/Gaze Preference: Within Defined Limits Tracking/Visual Pursuits: Able to track stimulus in all quads without difficulty Saccades: Additional eye shifts occurred during testing;Additional head turns occurred during testing Convergence: Within functional limits Perception Perception: Within Functional Limits Praxis Praxis: Intact  Cognition Overall Cognitive Status: Within Functional Limits for tasks assessed Orientation Level: Oriented X4 Sensation Sensation Light Touch: Impaired Detail Peripheral sensation comments: R thumb/toe numbness/ tingling present at baseline Light Touch Impaired Details: Impaired RUE;Impaired RLE Hot/Cold: Appears Intact Proprioception: Appears  Intact Coordination Gross  Motor Movements are Fluid and Coordinated: No Fine Motor Movements are Fluid and Coordinated: No Coordination and Movement Description: R hemipareisis UE>LE, very mild ataxia, imrpoved from eval Finger Nose Finger Test: mild ataxia on R, improved from eval Heel Shin Test: mild ataxia on the R side Motor  Motor Motor: Ataxia;Other (comment) Motor - Discharge Observations: very mild R HP and ataxia, improved since eval  Mobility Bed Mobility Bed Mobility: Supine to Sit;Sit to Supine Supine to Sit: Independent Sit to Supine: Independent Transfers Transfers: Sit to Stand;Stand to [employer] Transfers Sit to Stand: Independent Stand to Sit: Independent Stand Pivot Transfers: Independent Transfer (Assistive device): None Locomotion  Gait Gait Assistance: Independent Gait Distance (Feet): 1128 Feet Assistive device: None Gait Gait: Yes Gait Pattern: Impaired Gait Pattern: Ataxic;Right flexed knee in stance Stairs / Additional Locomotion Stairs: Yes Stairs Assistance: Independent with assistive device Stair Management Technique: Two rails;Step to pattern Number of Stairs: 16 Height of Stairs: 6 Ramp: Independent with assistive device Curb: Independent with assistive device Pick up small object from the floor assist level: Independent with assistive device Wheelchair Mobility Wheelchair Mobility: No  Trunk/Postural Assessment  Cervical Assessment Cervical Assessment: Within Functional Limits Thoracic Assessment Thoracic Assessment: Exceptions to [facility] (mildly rounded shoulders) Lumbar Assessment Lumbar Assessment: Within Functional Limits Postural Control Postural Control: Within Functional Limits  Balance Balance Balance Assessed: Yes Standardized Balance Assessment Standardized Balance Assessment: Timed Up and Go Test Timed Up and Go Test TUG: Normal TUG Normal TUG (seconds): 12.49 Static Sitting Balance Static Sitting - Level of  Assistance: 7: Independent Dynamic Sitting Balance Dynamic Sitting - Level of Assistance: 7: Independent Static Standing Balance Static Standing - Balance Support: During functional activity;No upper extremity supported Static Standing - Level of Assistance: 7: Independent Dynamic Standing Balance Dynamic Standing - Balance Support: During functional activity;No upper extremity supported Dynamic Standing - Level of Assistance: 7: Independent Extremity Assessment      RLE Assessment RLE Assessment: Within Functional Limits Active Range of Motion (AROM) Comments: WFL RLE Strength RLE Overall Strength: Within Functional Limits for tasks assessed Right Hip Flexion: 4+/5 Right Hip Extension: 5/5 Right Hip ABduction: 5/5 Right Hip ADduction: 5/5 Right Knee Flexion: 5/5 Right Knee Extension: 5/5 Right Ankle Dorsiflexion: 5/5 Right Ankle Plantar Flexion: 4+/5 LLE Assessment LLE Assessment: Within Functional Limits LLE Strength LLE Overall Strength: Within Functional Limits for tasks assessed Left Hip Flexion: 5/5 Left Hip Extension: 5/5 Left Hip ABduction: 5/5 Left Hip ADduction: 5/5 Left Knee Flexion: 5/5 Left Knee Extension: 5/5 Left Ankle Dorsiflexion: 5/5 Left Ankle Plantar Flexion: 5/5    Lorie Phenix 04/24/2022, 5:36 PM

## 2022-04-24 NOTE — Plan of Care (Signed)
  Problem: RH Balance Goal: LTG Patient will maintain dynamic standing with ADLs (OT) Description: LTG:  Patient will maintain dynamic standing balance with assist during activities of daily living (OT)  Outcome: Completed/Met   Problem: Sit to Stand Goal: LTG:  Patient will perform sit to stand in prep for activites of daily living with assistance level (OT) Description: LTG:  Patient will perform sit to stand in prep for activites of daily living with assistance level (OT) Outcome: Completed/Met   Problem: RH Eating Goal: LTG Patient will perform eating w/assist, cues/equip (OT) Description: LTG: Patient will perform eating with assist, with/without cues using equipment (OT) Outcome: Completed/Met   Problem: RH Grooming Goal: LTG Patient will perform grooming w/assist,cues/equip (OT) Description: LTG: Patient will perform grooming with assist, with/without cues using equipment (OT) Outcome: Completed/Met   Problem: RH Bathing Goal: LTG Patient will bathe all body parts with assist levels (OT) Description: LTG: Patient will bathe all body parts with assist levels (OT) Outcome: Completed/Met   Problem: RH Dressing Goal: LTG Patient will perform upper body dressing (OT) Description: LTG Patient will perform upper body dressing with assist, with/without cues (OT). Outcome: Completed/Met Goal: LTG Patient will perform lower body dressing w/assist (OT) Description: LTG: Patient will perform lower body dressing with assist, with/without cues in positioning using equipment (OT) Outcome: Completed/Met   Problem: RH Toileting Goal: LTG Patient will perform toileting task (3/3 steps) with assistance level (OT) Description: LTG: Patient will perform toileting task (3/3 steps) with assistance level (OT)  Outcome: Completed/Met   Problem: RH Functional Use of Upper Extremity Goal: LTG Patient will use RT/LT upper extremity as a (OT) Description: LTG: Patient will use right/left upper  extremity as a stabilizer/gross assist/diminished/nondominant/dominant level with assist, with/without cues during functional activity (OT) Outcome: Completed/Met   Problem: RH Simple Meal Prep Goal: LTG Patient will perform simple meal prep w/assist (OT) Description: LTG: Patient will perform simple meal prep with assistance, with/without cues (OT). Outcome: Completed/Met   Problem: RH Toilet Transfers Goal: LTG Patient will perform toilet transfers w/assist (OT) Description: LTG: Patient will perform toilet transfers with assist, with/without cues using equipment (OT) Outcome: Completed/Met   Problem: RH Tub/Shower Transfers Goal: LTG Patient will perform tub/shower transfers w/assist (OT) Description: LTG: Patient will perform tub/shower transfers with assist, with/without cues using equipment (OT) Outcome: Completed/Met   Problem: RH Furniture Transfers Goal: LTG Patient will perform furniture transfers w/assist (OT/PT) Description: LTG: Patient will perform furniture transfers  with assistance (OT/PT). Outcome: Completed/Met

## 2022-04-24 NOTE — Progress Notes (Addendum)
PROGRESS NOTE   Seen in gym, throwing ball while standing , discussed K+ level  Right index finger is sore no trauma, has hx OA ROS- neg CP, SOB, N/V/D  Objective:   No results found. Recent Labs    04/23/22 0521  WBC 6.6  HGB 15.0  HCT 44.3  PLT 225    Recent Labs    04/23/22 0521 04/24/22 0541  NA 139 138  K 5.2* 4.4  CL 105 106  CO2 27 23  GLUCOSE 91 93  BUN 18 20  CREATININE 1.47* 1.40*  CALCIUM 9.4 9.2     Intake/Output Summary (Last 24 hours) at 04/24/2022 0840 Last data filed at 04/24/2022 0235 Gross per 24 hour  Intake 507 ml  Output 625 ml  Net -118 ml         Physical Exam: Vital Signs Blood pressure 137/82, pulse 66, temperature 98.5 F (36.9 C), temperature source Oral, resp. rate 18, height 6' (1.829 m), weight 96.9 kg, SpO2 97 %.   General: No acute distress Mood and affect are appropriate Heart: Regular rate and rhythm no rubs murmurs or extra sounds Lungs: Clear to auscultation, breathing unlabored, no rales or wheezes Abdomen: Positive bowel sounds, soft nontender to palpation, nondistended Extremities: No clubbing, cyanosis, or edema Skin: No evidence of breakdown, no evidence of rash Neurologic: speech without dysarthria  Note:  Patient can touch right thumb to each finger of right hand.  Has 4/5 gross motor control of right hand but has reduced fine motor control of right hand compared to left. Speech is improving. Gross motor 4/5 on RIght side 5/5 on left side   Sensory: Normal to LT and proprioception BUE/BLE. Musculoskeletal: Full range of motion in all 4 extremities. Heberden's nodule fo RIght index DIP, mild erythema and tenderness, nail bed looks normal , mild pain with flexion   Assessment/Plan: 1. Functional deficits which require 3+ hours per day of interdisciplinary therapy in a comprehensive inpatient rehab setting. Physiatrist is providing close team supervision  and 24 hour management of active medical problems listed below. Physiatrist and rehab team continue to assess barriers to discharge/monitor patient progress toward functional and medical goals  Care Tool:  Bathing  Bathing activity did not occur: Refused           Bathing assist       Upper Body Dressing/Undressing Upper body dressing   What is the patient wearing?: Pull over shirt    Upper body assist Assist Level: Supervision/Verbal cueing    Lower Body Dressing/Undressing Lower body dressing      What is the patient wearing?: Underwear/pull up     Lower body assist Assist for lower body dressing: Moderate Assistance - Patient 50 - 74%     Toileting Toileting    Toileting assist Assist for toileting: Moderate Assistance - Patient 50 - 74%     Transfers Chair/bed transfer  Transfers assist     Chair/bed transfer assist level: Contact Guard/Touching assist     Locomotion Ambulation   Ambulation assist      Assist level: Contact Guard/Touching assist Assistive device: No Device Max distance: >200 ft   Walk 10 feet activity  Assist     Assist level: Contact Guard/Touching assist Assistive device: No Device   Walk 50 feet activity   Assist    Assist level: Contact Guard/Touching assist Assistive device: No Device    Walk 150 feet activity   Assist    Assist level: Contact Guard/Touching assist Assistive device: No Device    Walk 10 feet on uneven surface  activity   Assist     Assist level: Minimal Assistance - Patient > 75% Assistive device: Other (comment) (no AD)   Wheelchair     Assist Is the patient using a wheelchair?: No   Wheelchair activity did not occur: N/A         Wheelchair 50 feet with 2 turns activity    Assist    Wheelchair 50 feet with 2 turns activity did not occur: N/A       Wheelchair 150 feet activity     Assist  Wheelchair 150 feet activity did not occur: N/A        Blood pressure 137/82, pulse 66, temperature 98.5 F (36.9 C), temperature source Oral, resp. rate 18, height 6' (1.829 m), weight 96.9 kg, SpO2 97 %.  Medical Problem List and Plan: 1. 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 Formulized individual treatment plan based on medical history, underlying problem and comorbidities.   2.  Antithrombotics: -DVT/anticoagulation:  Pharmaceutical: Lovenox             -antiplatelet therapy: aspirin 81 mg daily and brilinta              5/21 Continue ASA and brillinta until 6/6 then ASA  and Plavix x 2 mo then ASA alone 3. Pain Management: Tylenol prn, may use diclofenac gel to index finger, no oral NSAIDs  4. Mood: LCSW to evaluate and provide emotional support             -antipsychotic agents: na 5. Neuropsych: This patient is capable of making decisions on his own behalf. 6. Skin/Wound Care: Routine skin care checks 7. Fluids/Electrolytes/Nutrition: Routine Is and Os and follow-up chemistries 8: Pontine stroke:              Brilinta 90 mg twice daily plus aspirin 81 mg daily for 1 month             Then Plavix 75 mg daily plus aspirin 81 mg daily for 2 months Then aspirin 81 mg daily only Continue statin 30 day cardiac event monitor at discharge 9: Hypertension: Continue amlodipine 2.5 mg daily. Holding hydralazine --home regimen: hydralazine 50 mg TID and amlodipine 10 mg daily             --Avoid low blood pressure given basilar artery stenosis             - BP has been essentially normal off meds will resume as needed starting with amlodipine  5/20 Some elevation in Bp, peak today 154/84, pt was not on any anti-hypertensives.  Start 2.5 mg amlodipine  daily and trend. 5/21 B/P this am 115/80 on 2.5 mg amlodipine dose, continue and trend b/p Vitals:   04/23/22 1342 04/23/22 1957  BP: (!) 160/88 137/82  Pulse: 73 66  Resp: 17 18  Temp: 98 F (36.7 C) 98.5 F (36.9 C)  SpO2: 96% 97%   Controlled 5/24  10: Graves disease s/p RAI/acquired hypothyroidism: continue Synthroid 11: Prediabetes: follow-up PCP/[facility]            -Glucose appears in good range on BMP, follow 12: Hyperlipidemia: continue Crestor 20 mg daily 13: CKD 3a: elevated creatinine 1.26 (?? baseline); follow-up BMP 5/20 Slightly elevated Crt, continue to trend with qMonday labs.     Latest Ref Rng & Units 04/24/2022    5:41 AM 04/23/2022    5:21 AM 04/17/2022    6:24 AM  BMP  Glucose 70 - 99 mg/dL 93   91   95    BUN 8 - 23 mg/dL '20   18   19    ' Creatinine 0.61 - 1.24 mg/dL 1.40   1.47   1.38    Sodium 135 - 145 mmol/L 138   139   137    Potassium 3.5 - 5.1 mmol/L 4.4   5.2   4.6    Chloride 98 - 111 mmol/L 106   105   106    CO2 22 - 32 mmol/L '23   27   25    ' Calcium 8.9 - 10.3 mg/dL 9.2   9.4   9.2     Labs stable  14: Vitamin B12 deficiency: continue 1,000 mcg daily 15. Constipation, senokot , sorbitol PRN             BM this am 5/22 16.  Mild Hyper K resolved without intervention  LOS: 8 days A FACE TO FACE EVALUATION WAS PERFORMED  Charlett Blake 04/24/2022, 8:40 AM

## 2022-04-24 NOTE — Progress Notes (Signed)
Inpatient Rehabilitation Care Coordinator Discharge Note   Patient Details  Name: Ivan Lawson MRN: [id number] Date of Birth: November 17, 1956   Discharge location: Home  Length of Stay: 9 days  Discharge activity level: supervision/MOD I  Home/community participation: patient brother  Patient response [id number] Literacy - How often do you need to have someone help you when you read instructions, pamphlets, or other written material from your doctor or pharmacy?: Never  Patient response [id number] Isolation - How often do you feel lonely or isolated from those around you?: Never  Services provided included: SW, Pharmacy, TR, CM, RN, SLP, OT, RD, PT, MD  Financial Services:  Financial Services Utilized: Private Insurance HTA  Choices offered to/list presented to: patient  Follow-up services arranged:  Outpatient    Outpatient Servicies: neuro      Patient response to transportation need: Is the patient able to respond to transportation needs?: Yes In the past 12 months, has lack of transportation kept you from medical appointments or from getting medications?: No In the past 12 months, has lack of transportation kept you from meetings, work, or from getting things needed for daily living?: No    Comments (or additional information):  Patient/Family verbalized understanding of follow-up arrangements:  Yes  Individual responsible for coordination of the follow-up plan: Sam  Confirmed correct DME delivered: Dyanne Iha 04/24/2022    Dyanne Iha

## 2022-04-24 NOTE — Plan of Care (Signed)
  Problem: RH Balance Goal: LTG Patient will maintain dynamic standing balance (PT) Description: LTG:  Patient will maintain dynamic standing balance with assistance during mobility activities (PT) Outcome: Completed/Met   Problem: Sit to Stand Goal: LTG:  Patient will perform sit to stand with assistance level (PT) Description: LTG:  Patient will perform sit to stand with assistance level (PT) Outcome: Completed/Met   Problem: RH Bed Mobility Goal: LTG Patient will perform bed mobility with assist (PT) Description: LTG: Patient will perform bed mobility with assistance, with/without cues (PT). Outcome: Completed/Met   Problem: RH Bed to Chair Transfers Goal: LTG Patient will perform bed/chair transfers w/assist (PT) Description: LTG: Patient will perform bed to chair transfers with assistance (PT). Outcome: Completed/Met   Problem: RH Car Transfers Goal: LTG Patient will perform car transfers with assist (PT) Description: LTG: Patient will perform car transfers with assistance (PT). Outcome: Completed/Met   Problem: RH Floor Transfers Goal: LTG Patient will perform floor transfers w/assist (PT) Description: LTG: Patient will perform floor transfers with assistance (PT). Outcome: Completed/Met   Problem: RH Ambulation Goal: LTG Patient will ambulate in home environment (PT) Description: LTG: Patient will ambulate in home environment, # of feet with assistance (PT). Outcome: Completed/Met Goal: LTG Patient will ambulate in community environment (PT) Description: LTG: Patient will ambulate in community environment, # of feet with assistance (PT). Outcome: Completed/Met   Problem: RH Stairs Goal: LTG Patient will ambulate up and down stairs w/assist (PT) Description: LTG: Patient will ambulate up and down # of stairs with assistance (PT) Outcome: Completed/Met

## 2022-04-25 ENCOUNTER — Telehealth: Payer: Self-pay | Admitting: *Deleted

## 2022-04-25 LAB — URIC ACID: Uric Acid, Serum: 7.9 mg/dL (ref 3.7–8.6)

## 2022-04-25 MED ORDER — METHOCARBAMOL 500 MG PO TABS
500.0000 mg | ORAL_TABLET | Freq: Four times a day (QID) | ORAL | 0 refills | Status: AC | PRN
Start: 2022-04-25 — End: ?

## 2022-04-25 MED ORDER — CLOPIDOGREL BISULFATE 75 MG PO TABS: 75.0000 mg | ORAL_TABLET | Freq: Every day | ORAL | 1 refills | Status: AC

## 2022-04-25 MED ORDER — TICAGRELOR 90 MG PO TABS: 90.0000 mg | ORAL_TABLET | Freq: Two times a day (BID) | ORAL | 0 refills | Status: AC

## 2022-04-25 MED ORDER — ACETAMINOPHEN 325 MG PO TABS
325.0000 mg | ORAL_TABLET | ORAL | Status: AC | PRN
Start: 2022-04-25 — End: ?

## 2022-04-25 MED ORDER — AMLODIPINE BESYLATE 2.5 MG PO TABS: 2.5000 mg | ORAL_TABLET | Freq: Every day | ORAL | 0 refills | Status: DC

## 2022-04-25 MED ORDER — DICLOFENAC SODIUM 1 % EX GEL: 2.0000 g | Freq: Four times a day (QID) | CUTANEOUS | Status: DC

## 2022-04-25 MED ORDER — ROSUVASTATIN CALCIUM 20 MG PO TABS: 20.0000 mg | ORAL_TABLET | Freq: Every day | ORAL | 0 refills | Status: DC

## 2022-04-25 MED ORDER — TRIAMCINOLONE ACETONIDE 40 MG/ML IJ SUSP
40.0000 mg | Freq: Once | INTRAMUSCULAR | Status: AC
  Administered 2022-04-25: 40 mg via INTRAMUSCULAR
  Filled 2022-04-25: qty 1

## 2022-04-25 NOTE — Telephone Encounter (Signed)
Patient calling with question bout his heart monitor. Please advise

## 2022-04-25 NOTE — Telephone Encounter (Signed)
Reviewed instructions with patient.  Patient will try to apply the monitor himself.  He will call to schedule appointment to have it applied if he has problems setting up.

## 2022-04-25 NOTE — Telephone Encounter (Signed)
LMVM returning your call.

## 2022-04-25 NOTE — Telephone Encounter (Signed)
Pt returning call. Req call back.

## 2022-04-25 NOTE — Progress Notes (Signed)
Inpatient Rehabilitation Discharge Medication Review by a Pharmacist  A complete drug regimen review was completed for this patient to identify any potential clinically significant medication issues.  High Risk Drug Classes Is patient taking? Indication by Medication  Antipsychotic No   Anticoagulant No   Antibiotic No   Opioid No   Antiplatelet Yes Aspirin, Ticagrelor- CVA prophylaxis. End date Ticagrelor: 05/11/2022 Plavix start date: 05/12/2022  Hypoglycemics/insulin No   Vasoactive Medication Yes Norvasc,   Chemotherapy No   Other Yes Crestor- HLD Synthroid- hypothyroidism     Type of Medication Issue Identified Description of Issue Recommendation(s)  Drug Interaction(s) (clinically significant)     Duplicate Therapy     Allergy     No Medication Administration End Date     Incorrect Dose     Additional Drug Therapy Needed     Significant med changes from prior encounter (inform family/care partners about these prior to discharge).    Other       Clinically significant medication issues were identified that warrant physician communication and completion of prescribed/recommended actions by midnight of the next day:  No  Comments pertinent for care: neurology wants the following:  Brilinta: end after 30 days (05/11/2022) Jenna Luo on discharge correctly]  followed by   Plavix / Aspirin: 60 days (05/12/2022 through 07/11/2022) Jenna Luo on discharge correctly]  followed by   aspirin alone (start 07/12/2022)  Time spent performing this drug regimen review (minutes):  30  Mykal Kirchman BS, PharmD, BCPS Clinical Pharmacist 04/25/2022 9:40 AM  Contact: [phone] after 3 PM  "Be curious, not judgmental..." -Debbora Dus

## 2022-04-25 NOTE — Progress Notes (Signed)
Patient discharged to home, transported by his brother.

## 2022-04-25 NOTE — Progress Notes (Addendum)
PROGRESS NOTE   Right index finger joint pain mildly improved with voltaren but still inhibits finger flexion no hx of scratch or puncture wound   ROS- neg CP, SOB, N/V/D  Objective:   No results found. Recent Labs    04/23/22 0521  WBC 6.6  HGB 15.0  HCT 44.3  PLT 225    Recent Labs    04/23/22 0521 04/24/22 0541  NA 139 138  K 5.2* 4.4  CL 105 106  CO2 27 23  GLUCOSE 91 93  BUN 18 20  CREATININE 1.47* 1.40*  CALCIUM 9.4 9.2     Intake/Output Summary (Last 24 hours) at 04/25/2022 0743 Last data filed at 04/24/2022 1700 Gross per 24 hour  Intake 956 ml  Output --  Net 956 ml         Physical Exam: Vital Signs Blood pressure 123/79, pulse (!) 58, temperature 98.6 F (37 C), temperature source Oral, resp. rate 18, height 6' (1.829 m), weight 96.9 kg, SpO2 99 %.   General: No acute distress Mood and affect are appropriate Heart: Regular rate and rhythm no rubs murmurs or extra sounds Lungs: Clear to auscultation, breathing unlabored, no rales or wheezes Abdomen: Positive bowel sounds, soft nontender to palpation, nondistended Extremities: No clubbing, cyanosis, or edema Skin: No evidence of breakdown, no evidence of rash, erythema over Right distal index finger mainly on dorsal aspect  Neurologic: speech without dysarthria  Note:  Patient can touch right thumb to each finger of right hand.  Has 4/5 gross motor control of right hand but has reduced fine motor control of right hand compared to left. Speech is improving. Gross motor 4/5 on RIght side 5/5 on left side   Sensory: Normal to LT and proprioception BUE/BLE. Musculoskeletal: Full range of motion in all 4 extremities. Heberden's nodule fo RIght index DIP, mild erythema and tenderness, nail bed looks normal , mild pain with flexion   Assessment/Plan: 1. Functional deficits due to RIght hemiparesis from CVA  Stable for D/C today F/u PCP in 3-4  weeks F/u PM&R 2 weeks See D/C summary See D/C instructions   Care Tool:  Bathing  Bathing activity did not occur: Refused           Bathing assist       Upper Body Dressing/Undressing Upper body dressing   What is the patient wearing?: Pull over shirt    Upper body assist Assist Level: Independent    Lower Body Dressing/Undressing Lower body dressing      What is the patient wearing?: Pants     Lower body assist Assist for lower body dressing: Independent     Toileting Toileting    Toileting assist Assist for toileting: Independent     Transfers Chair/bed transfer  Transfers assist     Chair/bed transfer assist level: Independent     Locomotion Ambulation   Ambulation assist      Assist level: Independent Assistive device: No Device Max distance: >200 ft   Walk 10 feet activity   Assist     Assist level: Independent Assistive device: No Device   Walk 50 feet activity   Assist    Assist  level: Independent Assistive device: No Device    Walk 150 feet activity   Assist    Assist level: Independent Assistive device: No Device    Walk 10 feet on uneven surface  activity   Assist     Assist level: Independent with assistive device Assistive device:  (rail)   Wheelchair     Assist Is the patient using a wheelchair?: No   Wheelchair activity did not occur: N/A         Wheelchair 50 feet with 2 turns activity    Assist    Wheelchair 50 feet with 2 turns activity did not occur: N/A       Wheelchair 150 feet activity     Assist  Wheelchair 150 feet activity did not occur: N/A       Blood pressure 123/79, pulse (!) 58, temperature 98.6 F (37 C), temperature source Oral, resp. rate 18, height 6' (1.829 m), weight 96.9 kg, SpO2 99 %.  Medical Problem List and Plan: 1. Functional deficits secondary to extension of left paramedian pontine infarct likely secondary to basilar artery stenosis              -patient may shower             -ELOS/Goals: 5/25   2.  Antithrombotics: -DVT/anticoagulation:  Pharmaceutical: Lovenox             -antiplatelet therapy: aspirin 81 mg daily and brilinta              5/21 Continue ASA and brillinta until 6/6 then ASA  and Plavix x 2 mo then ASA alone 3. Pain Management: Tylenol prn, may use diclofenac gel to index finger, no oral NSAIDs , uric acid level pending afebrile, no hx of gout but has fam hx Give IM triamcinolong prior to d/c Inflammatory OA vs gout 4. Mood: LCSW to evaluate and provide emotional support             -antipsychotic agents: na 5. Neuropsych: This patient is capable of making decisions on his own behalf. 6. Skin/Wound Care: Routine skin care checks 7. Fluids/Electrolytes/Nutrition: Routine Is and Os and follow-up chemistries 8: Pontine stroke:              Brilinta 90 mg twice daily plus aspirin 81 mg daily for 1 month             Then Plavix 75 mg daily plus aspirin 81 mg daily for 2 months Then aspirin 81 mg daily only Continue statin 30 day cardiac event monitor at discharge 9: Hypertension: Continue amlodipine 2.5 mg daily. Holding hydralazine --home regimen: hydralazine 50 mg TID and amlodipine 10 mg daily          Vitals:   04/24/22 2104 04/25/22 0502  BP: (!) 155/78 123/79  Pulse: 63 (!) 58  Resp:  18  Temp: 97.7 F (36.5 C) 98.6 F (37 C)  SpO2: 100% 99%   Controlled 5/25  10: Graves disease s/p RAI/acquired hypothyroidism: continue Synthroid 11: Prediabetes: follow-up PCP/[facility]            -Glucose appears in good range on BMP, follow 12: Hyperlipidemia: continue Crestor 20 mg daily 13: CKD 3a: elevated creatinine 1.26 (?? baseline); follow-up BMP 5/20 Slightly elevated Crt, continue to trend with qMonday labs.     Latest Ref Rng & Units 04/24/2022    5:41 AM 04/23/2022    5:21 AM 04/17/2022    6:24 AM  BMP  Glucose  70 - 99 mg/dL 93   91   95    BUN 8 - 23 mg/dL 20   18   19      Creatinine 0.61 - 1.24 mg/dL   0.26   3.78    Sodium 135 - 145 mmol/L 138   139   137    Potassium 3.5 - 5.1 mmol/L 4.4   5.2   4.6    Chloride 98 - 111 mmol/L 106   105   106    CO2 22 - 32 mmol/L 23   27   25     Calcium 8.9 - 10.3 mg/dL 9.2   9.4   9.2     Labs stable  14: Vitamin B12 deficiency: continue 1,000 mcg daily 15. Constipation, senokot , sorbitol PRN             BM this am 5/22 16.  Mild Hyper K resolved without intervention  LOS: 9 days A FACE TO FACE EVALUATION WAS PERFORMED  04/25/2022, 7:43 AM

## 2022-04-26 ENCOUNTER — Encounter: Payer: HMO | Admitting: Vascular Surgery

## 2022-04-26 ENCOUNTER — Ambulatory Visit (INDEPENDENT_AMBULATORY_CARE_PROVIDER_SITE_OTHER): Payer: HMO

## 2022-04-26 DIAGNOSIS — I4891 Unspecified atrial fibrillation: Secondary | ICD-10-CM

## 2022-04-26 DIAGNOSIS — I639 Cerebral infarction, unspecified: Secondary | ICD-10-CM

## 2022-04-26 DIAGNOSIS — I6389 Other cerebral infarction: Secondary | ICD-10-CM | POA: Diagnosis not present

## 2022-05-02 DIAGNOSIS — M79671 Pain in right foot: Secondary | ICD-10-CM | POA: Diagnosis not present

## 2022-05-06 NOTE — Therapy (Signed)
OUTPATIENT PHYSICAL THERAPY NEURO EVALUATION   Patient Name: Ivan Lawson MRN: [id number] DOB:February 17, 1956, 66 y.o., male Today's Date: 05/07/2022   PCP: Blair Heysobert Ehinger REFERRING PROVIDER: Mariam Dollaraniel Angiulli   PT End of Session - 05/07/22 1149     Visit Number 1    Date for PT Re-Evaluation 07/02/22    Progress Note Due on Visit 10    PT Start Time 1150    PT Stop Time 1235    PT Time Calculation (min) 45 min    Activity Tolerance Patient tolerated treatment well    Behavior During Therapy [facility] for tasks assessed/performed             Past Medical History:  Diagnosis Date   Hyperthyroidism    History reviewed. No pertinent surgical history. Patient Active Problem List   Diagnosis Date Noted   Left pontine cerebrovascular accident (HCC) 04/16/2022   Basilar artery stenosis 04/13/2022   Stroke-like symptom 04/12/2022   Cerebral thrombosis with cerebral infarction 04/12/2022   Acute ischemic stroke (HCC) 04/06/2022   Sinus bradycardia 04/06/2022   Erythrocytosis 04/06/2022   Acquired hypothyroidism 04/06/2022   B12 deficiency 01/07/2018   Hypothyroidism following radioiodine therapy 09/25/2016   Elevated PSA 05/03/2016   Numbness 03/22/2016   HTN (hypertension) 11/22/2015   Bilateral hearing loss 11/22/2015   Tinnitus 11/22/2015    ONSET DATE: 04/06/22  REFERRING DIAG: I63.9  THERAPY DIAG:  Left pontine cerebrovascular accident (HCC)  Other abnormalities of gait and mobility  Other lack of coordination  Rationale for Evaluation and Treatment Rehabilitation  SUBJECTIVE:                                                                                                                                                                                              SUBJECTIVE STATEMENT: Patient reports strength in his leg has come back on his R side and his stride is better. He is still having trouble with balance but does not feel like he is going to fall down.  Complains of gout flare up last Wednesday in R big toe that has made it difficult to walk, will see a doctor in a couple of days.  Pt accompanied by: self  PERTINENT HISTORY: Acute L paramedian pons infarct w/ increased extent of involvement compared w/ 04/06/22  PMH includes gout that flared up last Wednesday, HTN, acquired hypothyroidism following radioactive iodine therapy for Graves' disease, and prediabetes  PAIN:  Are you having pain? Yes: NPRS scale: 2/10 Pain location: R finger and R toe  Pain description: sharp, throbbing, dull Aggravating factors: walkings Relieving factors: steroid injection, ice  PRECAUTIONS: Fall  WEIGHT BEARING RESTRICTIONS No  FALLS: Has patient fallen in last 6 months? No  LIVING ENVIRONMENT: Lives with: lives alone Lives in: House/apartment Stairs: Yes: External: 5 steps; can reach both Has following equipment at home: Environmental consultant - 4 wheeled and Wheelchair (manual)  PLOF: Independent, retired  PATIENT GOALS be able to walk naturally, 10,000-12,000 steps a day again  OBJECTIVE:   DIAGNOSTIC FINDINGS: MRI indicated small subacute ischemia within L paramedian pons  COGNITION: Overall cognitive status: Within functional limits for tasks assessed   SENSATION: WFL  COORDINATION: WFL Heel to shin  Finger to nose RAMPS  POSTURE: rounded shoulders, forward head, and weight shift left  UPPER EXTREMITY ROM: WFL UPPER EXTREMITY MMT: 4+ flexion R  LOWER EXTREMITY ROM:  WFL  MMT Right Eval Left Eval  Hip flexion 4 4+  Hip extension    Hip abduction    Hip adduction    Hip internal rotation 4 4  Hip external rotation 4 4  Knee flexion 4- 4+  Knee extension 4- 4+  Ankle dorsiflexion 5 5  Ankle plantarflexion 5 5  Ankle inversion    Ankle eversion     (Blank rows = not tested)    TRANSFERS: Assistive device utilized: None  Sit to stand: Complete Independence Stand to sit: Complete Independence   STAIRS:  Level of Assistance:  Complete Independence  Stair Negotiation Technique: Alternating Pattern  with Single Rail on Left   GAIT: Gait pattern: decreased arm swing- Right, decreased step length- Right, decreased stance time- Right, decreased ankle dorsiflexion- Right, ataxic, trendelenburg, lateral lean- Left, and decreased trunk rotation Distance walked: 247ft Assistive device utilized: None Level of assistance: Complete Independence Comments: Decreased step length, L shoulder height is elevated, favors LLE, trunk lean to L, trendelenberg , decreased speed  FUNCTIONAL TESTs:  5 times sit to stand: 12.55s Timed up and go (TUG): 11.05s Berg Balance Scale: TBD Functional gait assessment: 13/30 SLS L 2 secs at best R unable to do w/o support 4 stage balance (30s) EO firm EC firm min sway EO foam  EC foam CGA   TODAY'S TREATMENT:  Functional tests, patient education on decreasing fall risks   PATIENT EDUCATION: Education details: POC, fall risk Person educated: Patient Education method: Explanation and Verbal cues Education comprehension: verbalized understanding and returned demonstration   HOME EXERCISE PROGRAM: TBD   GOALS: Goals reviewed with patient? Yes  SHORT TERM GOALS: Target date: 06/05/22  Patient will be independent with initial HEP. Goal status: INITIAL  2.  Patient will be educated on strategies to decrease risk of falls.  Goal status: INITIAL   LONG TERM GOALS: Target date: 07/02/22  Patient will be independent with advanced/ongoing HEP to improve outcomes and carryover.  Goal status: INITIAL  2.  Patient will demonstrate at least 20/30 on FGA to improve gait stability and reduce risk for falls. Baseline: 13/30 Goal status: INITIAL  3.  Patient will demonstrate decreased fall risk by scoring < 12 sec on 5xSTS. Goal status: INITIAL  4. Patient will demonstrate SLS >10 secs to decrease fall risk and improve balance.    Goal status: INITIAL   ASSESSMENT:  CLINICAL  IMPRESSION: Patient is a 66 y.o. male who was seen today for physical therapy evaluation and treatment following an acute ischemic stroke in early May. He was in inpatient therapy for 10 days and stated he made great improvements with strength and gait. Patient presents with good strength and ROM in upper and lower extremities but has moderate  balance deficits especially with sudden turns, on foam surfaces, walking backwards and walking with narrow base. Patient will benefit from skilled PT intervention to address balance problems to decrease fall risk and return to independence.    OBJECTIVE IMPAIRMENTS Abnormal gait, decreased balance, decreased coordination, and decreased strength.   ACTIVITY LIMITATIONS transfers and locomotion level  PARTICIPATION LIMITATIONS: driving, community activity, and yard work  PERSONAL FACTORS 1-2 comorbidities: HTN, hx of strokes  are also affecting patient's functional outcome.   REHAB POTENTIAL: Good  CLINICAL DECISION MAKING: Stable/uncomplicated  EVALUATION COMPLEXITY: Low  PLAN: PT FREQUENCY: 2x/week  PT DURATION: 8 weeks  PLANNED INTERVENTIONS: Therapeutic exercises, Therapeutic activity, Neuromuscular re-education, Balance training, Gait training, Patient/Family education, Joint mobilization, Stair training, Cryotherapy, Moist heat, Biofeedback, and Re-evaluation  PLAN FOR NEXT SESSION: BERG balance, static and dynamic balance activities, cognitive dual tasking   Pepin, PT 05/07/2022, 5:16 PM

## 2022-05-07 ENCOUNTER — Ambulatory Visit: Payer: PPO | Attending: Physician Assistant

## 2022-05-07 ENCOUNTER — Encounter: Payer: Self-pay | Admitting: Occupational Therapy

## 2022-05-07 ENCOUNTER — Ambulatory Visit: Payer: PPO | Admitting: Occupational Therapy

## 2022-05-07 DIAGNOSIS — R2689 Other abnormalities of gait and mobility: Secondary | ICD-10-CM

## 2022-05-07 DIAGNOSIS — R278 Other lack of coordination: Secondary | ICD-10-CM

## 2022-05-07 DIAGNOSIS — I639 Cerebral infarction, unspecified: Secondary | ICD-10-CM | POA: Diagnosis not present

## 2022-05-07 DIAGNOSIS — I69351 Hemiplegia and hemiparesis following cerebral infarction affecting right dominant side: Secondary | ICD-10-CM | POA: Diagnosis not present

## 2022-05-07 NOTE — Therapy (Signed)
OUTPATIENT OCCUPATIONAL THERAPY NEURO EVALUATION  Patient Name: Ivan Lawson MRN: [id number] DOB:15-Nov-1956, 66 y.o., male Today's Date: 05/07/2022  PCP: Blair Heys, MD REFERRING PROVIDER: Charlton Amor, PA-C    OT End of Session - 05/07/22 1106     Visit Number 1    Number of Visits 17    Date for OT Re-Evaluation 07/26/22    Authorization Type [employer]    Authorization Time Period VL: [place]    Progress Note Due on Visit 10    OT Start Time 1102    OT Stop Time 1147    OT Time Calculation (min) 45 min    Activity Tolerance Patient tolerated treatment well    Behavior During Therapy WFL for tasks assessed/performed            Past Medical History:  Diagnosis Date   Hyperthyroidism    History reviewed. No pertinent surgical history. Patient Active Problem List   Diagnosis Date Noted   Left pontine cerebrovascular accident (HCC) 04/16/2022   Basilar artery stenosis 04/13/2022   Stroke-like symptom 04/12/2022   Cerebral thrombosis with cerebral infarction 04/12/2022   Acute ischemic stroke (HCC) 04/06/2022   Sinus bradycardia 04/06/2022   Erythrocytosis 04/06/2022   Acquired hypothyroidism 04/06/2022   B12 deficiency 01/07/2018   Hypothyroidism following radioiodine therapy 09/25/2016   Elevated PSA 05/03/2016   Numbness 03/22/2016   HTN (hypertension) 11/22/2015   Bilateral hearing loss 11/22/2015   Tinnitus 11/22/2015    ONSET DATE: 04/12/22  REFERRING DIAG: I63.9 (ICD-10-CM) - Cerebral infarction, unspecified   THERAPY DIAG: Hemiplegia and hemiparesis following cerebral infarction affecting right dominant side (HCC)  Other lack of coordination  Other abnormalities of gait and mobility  Rationale for Evaluation and Treatment Rehabilitation  SUBJECTIVE:   SUBJECTIVE STATEMENT: Pt arrives to OP OT evaluation w/ primary concern related to decreased control and coordination of RUE, which is his dominant side; reports he is having to  think a lot harder to achieve movements that used to come easily to him. Pt also states he developed gout in his R index finger while he was in the hospital and believes this was due to him "overdoing it." Reports he has made significant improvements since onset of the second stroke, stating that those symptoms were significantly worse than when he presented to the ED the first time (04/06/22) for tingling in the fingertips and toes on his R side, which had almost completely resolved prior to d/c home. Pt accompanied by: self  PERTINENT HISTORY: Acute L paramedian pons infarct w/ increased extent of involvement compared w/ 04/06/22 (MRI indicated small subacute ischemia within L paramedian pons); PMH includes gout, HTN, acquired hypothyroidism following radioactive iodine therapy for Graves' disease, and prediabetes  PRECAUTIONS: Fall; no driving, per MD  WEIGHT BEARING RESTRICTIONS: No  PAIN: Are you having pain? Yes: NPRS scale: 4/10 Pain location: R index finger Pain description: pain w/ finger flexion Aggravating factors: movement Relieving factors: voltaren gel  FALLS: Has patient fallen in last 6 months? No  LIVING ENVIRONMENT: Lives with: lives alone Lives in: House/apartment Stairs: Yes: External: 5 steps; can reach both Has following equipment at home: Dan Humphreys - 4 wheeled and Wheelchair (manual)  PLOF: Independent and Vocation/Vocational requirements: retired; Midwife  PATIENT GOALS: Get dexterity back in R hand; return to fishing, throwing a baseball  OBJECTIVE:   HAND DOMINANCE: Right  ADLs: Overall ADLs: Mod I w/ all BADLs - reports extended time Equipment: Shower seat with back, Grab bars, Walk  in shower, and Reacher  IADLs: Light housekeeping: Able to complete light tasks w/out difficulty Meal Prep: Able to complete simple snack and meal prep w/ Mod I, requiring extended time and increased R-sided awareness for safety Community mobility: Unable to drive at this  time, per medical restrictions Medication management: Independent Financial management: Independent Handwriting: Increased time; decreased smoothness  MOBILITY STATUS:  Mild ataxic gait observed ambulating in/out of session  ACTIVITY TOLERANCE: To be further assessed in functional context  FUNCTIONAL OUTCOME MEASURES: Upper Extremity Functional Index: 58/80  UPPER EXTREMITY ROM:  Active ROM Right eval - 6/6 Left eval - 6/6  Shoulder flexion [facility]  Shoulder abduction    Shoulder adduction    Shoulder extension    Shoulder internal rotation    Shoulder external rotation    Elbow flexion    Elbow extension    Wrist flexion    Wrist extension 48 60  Wrist ulnar deviation    Wrist radial deviation    Wrist pronation [facility] WFL  Wrist supination 65 84  (Blank rows = not tested)  UPPER EXTREMITY MMT:     MMT Right eval - 6/6 Left eval - 6/6  Shoulder flexion 4/5 5/5  Shoulder abduction 4/5   Shoulder adduction 4/5   Shoulder extension 4+/5   Shoulder internal rotation 4/5   Shoulder external rotation 4/5   Middle trapezius 4+/5   Elbow flexion 4/5   Elbow extension 4/5   Wrist pronation 4/5   Wrist supination 4/5    HAND FUNCTION: Grip strength: Right: 53 lbs; Left: 100 lbs  COORDINATION: 9 Hole Peg test: Right: 38.4 sec; Left: 27.1 sec Box and Blocks:  Right 36 blocks, Left 51 blocks  SENSATION: Light touch: WFL Hot/Cold: WFL per pt report  EDEMA: No observable edema  COGNITION: Overall cognitive status: Within functional limits for tasks assessed  VISION: To be further assessed in functional context prn Baseline vision: Bifocals  OBSERVATIONS: Mild ataxia and decreased speed of RUE observed   TODAY'S TREATMENT:  N/A   PATIENT EDUCATION: Education provided on role and purpose of OT, as well as potential interventions and goals for therapy based on initial evaluation findings. Person educated: Patient Education method: Explanation Education  comprehension: verbalized understanding   HOME EXERCISE PROGRAM: To be administered   GOALS: Goals reviewed with patient? Yes  SHORT TERM GOALS: Target date: 06/16/22  STG  Status:  1 Pt will increase GMC of R, dominant UE, as evidenced by increasing Box and Blocks score by at least 4 Baseline: Right 36 blocks, Left 51 blocks Initial  2 Pt will demonstrate independence w/ initial HEP designed for gross and fine motor control/coordination of RUE Initial  3 Pt will improve grip strength of R hand to at least 60 lbs for improved functional use of R, dominant UE Baseline: Right: 53 lbs; Left: 100 lbs Initial     LONG TERM GOALS: Target date: 07/26/22  LTG  Status:  1 Pt will improve score of UEFI to at least 64/80 to indicate improved functional use of RUE during all ADLs Baseline: 58/80 Initial  2 Pt will improve grip strength of R hand to at least 75 lbs for improved functional use of R, dominant UE by d/c Baseline: Right: 53 lbs; Left: 100 lbs Initial  3 Pt will increase GMC of R, dominant UE, as evidenced by increasing Box and Blocks score to within 4 blocks compared to L side Baseline: Right 36 blocks, Left 51 blocks Initial  4 Pt to improve participation in functional FM tasks w/ R, dominant hand as evidenced by decreasing time to complete 9-HPT by at least 4 sec Baseline: Right: 38.4 sec; Left: 27.1 sec Initial  5 Pt will demonstrate improved RUE GMC and coordination as evidenced by increasing accuracy w/ finger-to-nose test within 20 sec Baseline: to be assessed Initial     ASSESSMENT:  CLINICAL IMPRESSION: Pt is a 66 y/o male who presents to OP OT s/p L pons infarct w/ residual R-sided weakness. PMH includes HTN, acquired hypothyroidism following radioactive iodine therapy for Graves' disease, and prediabetes. Pt currently lives alone in a single-level home and is retired but was very active prior to onset. Pt will benefit from skilled occupational therapy services to address  strength and coordination, altered sensation, balance, GM/FM control, introduction of compensatory strategies/AE prn, and implementation of an HEP to improve participation and safety during ADLs and return to higher level IADLs.  PERFORMANCE DEFICITS in functional skills including ADLs, IADLs, coordination, dexterity, sensation, strength, FMC, GMC, mobility, balance, decreased knowledge of precautions, decreased knowledge of use of DME, and UE functional use, and cognitive skills including safety awareness.  IMPAIRMENTS are limiting patient from IADLs, work, and leisure.   COMORBIDITIES may have co-morbidities  that affects occupational performance. Patient will benefit from skilled OT to address above impairments and improve overall function.  MODIFICATION OR ASSISTANCE TO COMPLETE EVALUATION: No modification of tasks or assist necessary to complete an evaluation.  OT OCCUPATIONAL PROFILE AND HISTORY: Detailed assessment: Review of records and additional review of physical, cognitive, psychosocial history related to current functional performance.  CLINICAL DECISION MAKING: Moderate - several treatment options, min-mod task modification necessary  REHAB POTENTIAL: Good  EVALUATION COMPLEXITY: Moderate   PLAN: OT FREQUENCY: 2x/week  OT DURATION: 8 weeks  PLANNED INTERVENTIONS: self care/ADL training, therapeutic exercise, therapeutic activity, neuromuscular re-education, manual therapy, balance training, functional mobility training, electrical stimulation, moist heat, cryotherapy, patient/family education, and DME and/or AE instructions  RECOMMENDED OTHER SERVICES: Currently receiving PT services at this location  CONSULTED AND AGREED WITH PLAN OF CARE: Patient  PLAN FOR NEXT SESSION: Initiate HEP for light RUE strengthening, grip strength, GMC, FMC   Rosie Fate, MSOT, OTR/L 05/07/2022, 1:14 PM

## 2022-05-08 ENCOUNTER — Encounter: Payer: PPO | Attending: Registered Nurse | Admitting: Registered Nurse

## 2022-05-08 ENCOUNTER — Encounter: Payer: Self-pay | Admitting: Registered Nurse

## 2022-05-08 VITALS — BP 130/81 | HR 66 | Ht 72.0 in | Wt 212.0 lb

## 2022-05-08 DIAGNOSIS — I10 Essential (primary) hypertension: Secondary | ICD-10-CM | POA: Diagnosis not present

## 2022-05-08 DIAGNOSIS — I639 Cerebral infarction, unspecified: Secondary | ICD-10-CM | POA: Insufficient documentation

## 2022-05-08 DIAGNOSIS — M79644 Pain in right finger(s): Secondary | ICD-10-CM | POA: Insufficient documentation

## 2022-05-08 NOTE — Progress Notes (Signed)
Subjective:    Patient ID: Ivan Lawson, male    DOB: 03-15-1956, 66 y.o.   MRN: [id number]  [id number] C Ivan Lawson is a 66 y.o. male who is here for Ivan Lawson appointment for follow up of his Left Pontine cerebrovascular accident, Essential Hypertension and Right finger pain. He presented to [facility] ED on 04/12/2022 H&P: Ivan Nida NP HPI: Ivan Lawson is a 66 y.o. male with medical history significant of hypertension, radioactive iodine for Graves' now with hypothyroidism, CKD 3 with baseline creatinine 1.3 and GFR 56, prediabetes.  Patient was recently discharged from the hospital on/8 after being treated for small acute ischemic stroke in the left paramedian pons with right-sided numbness and paresthesias with mild flaccid dysarthria.  At time of discharge patient's right-sided symptoms had nearly resolved and SLP had evaluated him and also noted that his dysarthria had essentially resolved and he would not need any outpatient SLP nor PT services.  On the morning of presentation patient awakened.  Seem to be fine initially and took his normal blood pressure and other medications.  He subsequent currently redeveloped significant right upper and lower extremity numbness and weakness as well as slurred speech and worsening dysarthria.  Initial CT as well as CTA of the neck without any acute changes including no hemorrhagic changes.  EDP notified neurology who has evaluated the patient.  A stuttering lacunar infarct is suspected.  MRI without contrast recommended.  Patient appeared to be dehydrated so was started on normal saline at 125 cc/h per neurologist recommendation.  Neuro recommended admission for several days for bed rest with near supine positioning and IV fluids.  Not a candidate for tPA due to recent stroke and not candidate for thrombectomy due to no LVO.  CT Head:  IMPRESSION: 1. Known recent left pontine infarct by prior MRI. No detectable change. 2. Chronic white matter  ischemia.  CTA: IMPRESSION: 1. No emergent finding. 2. Intracranial atherosclerosis with most notable stenoses affecting the mid basilar, left proximal PCA, and right A1 origin. 3. No flow limiting stenosis or embolic source seen in the neck.   MR: Brain IMPRESSION: Acute left paramedian pons infarct with increased extent of involvement since 04/06/2022. otherwise stable appearance.  Neurology Consulted [name] was admitted to inpatient rehabilitation on 04/16/2022 and discharged home on 04/25/2022. He is [facility]. He states he has pain in his right index finger with burning. He rates his pain 3.   Pain Inventory Average Pain 4 Pain Right Now 3 My pain is constant and burning  LOCATION OF PAIN  Right index finger, right greater toe  BOWEL Number of stools per week: 2  Oral laxative use Yes  Type of laxative Miralax  BLADDER Normal  Mobility walk without assistance how many minutes can you walk? 20 minutes ability to climb steps?  yes do you drive?  no Do you have any goals in this area?  yes  Function retired  Neuro/Psych weakness numbness  Prior Studies Any changes since last visit?  no  Physicians involved in your care Any changes since last visit?  no   Family History  Problem Relation Age of Onset   Thyroid disease Neg Hx    Social History   Socioeconomic History   Marital status: Single    Spouse name: Not on file   Number of children: Not on file   Years of education: Not on file   Highest education level: Not on file  Occupational History   Not on file  Tobacco Use   Smoking status: Never   Smokeless tobacco: Never  Vaping Use   Vaping Use: Never used  Substance and Sexual Activity   Alcohol use: Not Currently   Drug use: Not Currently   Sexual activity: Not on file  Other Topics Concern   Not on file  Social History Narrative   Not on file   Social Determinants of  Health   Financial Resource Strain: Not on file  Food Insecurity: Not on file  Transportation Needs: Not on file  Physical Activity: Not on file  Stress: Not on file  Social Connections: Not on file   History reviewed. No pertinent surgical history. Past Medical History:  Diagnosis Date   Hyperthyroidism    Ht 6' (1.829 m)   Wt 212 lb (96.2 kg)   BMI 28.75 kg/m   Opioid Risk Score:   Fall Risk Score:  `1  Depression screen PHQ 2/9      View : No data to display.          Review of Systems  Gastrointestinal:  Positive for constipation.  Musculoskeletal:        Balance issue   Skin:        Lump on left index finger burns  All other systems reviewed and are negative.     Objective:   Physical Exam Vitals and nursing note reviewed.  Constitutional:      Appearance: Normal appearance.  Cardiovascular:     Rate and Rhythm: Normal rate and regular rhythm.     Pulses: Normal pulses.     Heart sounds: Normal heart sounds.  Pulmonary:     Effort: Pulmonary effort is normal.     Breath sounds: Normal breath sounds.  Musculoskeletal:     Cervical back: Normal range of motion and neck supple.     Comments: Normal Muscle Bulk and Muscle Testing Reveals:  Upper Extremities: Full ROM and Muscle Strength on Right 4/5 and Left 5/5 Lower Extremities: Full ROM and Muscle Strength 5/5 Arises from Table with ease Narrow Based  Gait     Skin:    General: Skin is warm and dry.  Neurological:     Mental Status: He is alert and oriented to person, place, and time.  Psychiatric:        Mood and Affect: Mood normal.        Behavior: Behavior normal.         Assessment & Plan:  Left Pontine cerebrovascular accident,: Continue outpatient therapy at [facility]. Continue current medication regimen. He has a scheduled appointment with Neurology.   Essential Hypertension: Continue current medication regimen.   Right finger pain: Continue to Monitor. He will F?U with his PCP.   F/U with [name] in 4- 6 weeks

## 2022-05-09 NOTE — Progress Notes (Signed)
Office Note     CC: History of left hemispheric stroke with right-sided deficits with carotid disease Requesting Provider:  Blair Heys, MD  HPI: Ivan Lawson is a 66 y.o. (Feb 27, 1956) male presenting at the request of .Blair Heys, MD due to history of left hemispheric stroke with right-sided deficits and imaging demonstrating carotid disease.  On exam today, Ivan Lawson was doing well.  [place] native of [place] he has lived in this area his entire life and worked as a Product/process development scientist prior to retirement.  Over the last 10 years, he is taking care of his elderly mother who recently was placed in a home.  Last month, Ivan Lawson presented to the hospital and was diagnosed with left paramedian pontine stroke with right-sided numbness and paresthesias, as well as dysarthria.  Work-up demonstrated normal echo, small amount of carotid disease.  He was placed on dual antiplatelet therapy and statin and discharged to inpatient rehab.  Ivan Lawson has since been discharged from inpatient rehab.  He has been doing well with no new deficits.  His right-sided deficits have improved, however he continues to have some dexterity issues.  Speech has also improved. He is a non-smoker who has been compliant with his medications.  He is currently wearing a Holter monitor.  Past Medical History:  Diagnosis Date   Hyperthyroidism     No past surgical history on file.  Social History   Socioeconomic History   Marital status: Single    Spouse name: Not on file   Number of children: Not on file   Years of education: Not on file   Highest education level: Not on file  Occupational History   Not on file  Tobacco Use   Smoking status: Never   Smokeless tobacco: Never  Vaping Use   Vaping Use: Never used  Substance and Sexual Activity   Alcohol use: Not Currently   Drug use: Not Currently   Sexual activity: Not on file  Other Topics Concern   Not on file  Social History Narrative    Not on file   Social Determinants of Health   Financial Resource Strain: Not on file  Food Insecurity: Not on file  Transportation Needs: Not on file  Physical Activity: Not on file  Stress: Not on file  Social Connections: Not on file  Intimate Partner Violence: Not on file   Family History  Problem Relation Age of Onset   Thyroid disease Neg Hx     Current Outpatient Medications  Medication Sig Dispense Refill   acetaminophen (TYLENOL) 325 MG tablet Take 1-2 tablets (325-650 mg total) by mouth every 4 (four) hours as needed for mild pain.     amLODipine (NORVASC) 10 MG tablet Take 10 mg by mouth daily.     amLODipine (NORVASC) 2.5 MG tablet Take 1 tablet (2.5 mg total) by mouth daily. 30 tablet 0   aspirin 81 MG EC tablet Take 1 tablet (81 mg total) by mouth daily. Swallow whole. 30 tablet 11   [START ON 05/12/2022] clopidogrel (PLAVIX) 75 MG tablet Take 1 tablet (75 mg total) by mouth daily. Starting on 6/11, take for 60 days (2 months) 30 tablet 1   diclofenac Sodium (VOLTAREN) 1 % GEL Apply 2 g topically 4 (four) times daily.     hydrALAZINE (APRESOLINE) 50 MG tablet hydralazine 50 mg tablet (Patient not taking: Reported on 05/08/2022)     levothyroxine (SYNTHROID) 137 MCG tablet Take 137 mcg by mouth every morning.  methocarbamol (ROBAXIN) 500 MG tablet Take 1 tablet (500 mg total) by mouth every 6 (six) hours as needed for muscle spasms. 60 tablet 0   predniSONE (STERAPRED UNI-PAK 21 TAB) 10 MG (21) TBPK tablet prednisone 10 mg tablets in a dose pack  Take 1 dose pk by oral route.     rosuvastatin (CRESTOR) 20 MG tablet Take 1 tablet (20 mg total) by mouth daily. 30 tablet 0   ticagrelor (BRILINTA) 90 MG TABS tablet Take 1 tablet (90 mg total) by mouth 2 (two) times daily for 16 days. 32 tablet 0   vitamin B-12 (CYANOCOBALAMIN) 1000 MCG tablet Take 1,000 mcg by mouth daily.     No current facility-administered medications for this visit.    Allergies  Allergen Reactions    Benadryl [Diphenhydramine] Anxiety     REVIEW OF SYSTEMS:   denotes positive finding,  denotes negative finding Cardiac  Comments:  Chest pain or chest pressure:    Shortness of breath upon exertion:    Short of breath when lying flat:    Irregular heart rhythm:        Vascular    Pain in calf, thigh, or hip brought on by ambulation:    Pain in feet at night that wakes you up from your sleep:     Blood clot in your veins:    Leg swelling:         Pulmonary    Oxygen at home:    Productive cough:     Wheezing:         Neurologic    Sudden weakness in arms or legs:     Sudden numbness in arms or legs:     Sudden onset of difficulty speaking or slurred speech:    Temporary loss of vision in one eye:     Problems with dizziness:         Gastrointestinal    Blood in stool:     Vomited blood:         Genitourinary    Burning when urinating:     Blood in urine:        Psychiatric    Major depression:         Hematologic    Bleeding problems:    Problems with blood clotting too easily:        Skin    Rashes or ulcers:        Constitutional    Fever or chills:      PHYSICAL EXAMINATION:  There were no vitals filed for this visit.  General:  WDWN in NAD; vital signs documented above Gait: Not observed HENT: WNL, normocephalic Pulmonary: normal non-labored breathing , without wheezing Cardiac: regular HR Abdomen: soft, NT, no masses Skin: with rashes Vascular Exam/Pulses:  Right Left  Radial 2+ (normal) 2+ (normal)  Ulnar 2+ (normal) 2+ (normal)  Femoral    Popliteal    DP 2+ (normal) 2+ (normal)  PT 2+ (normal) 2+ (normal)   Extremities: without ischemic changes, without Gangrene , without cellulitis; without open wounds;  Musculoskeletal: no muscle wasting or atrophy  Neurologic: A&O X 3;  No focal weakness or paresthesias are detected Psychiatric:  The pt has Normal affect.   Non-Invasive Vascular Imaging:   CTA HEAD FINDINGS    Anterior circulation: Atheromatous calcification along the carotid siphons. No branch occlusion, beading, or aneurysm. High-grade right A1 origin Stenosis.   Posterior circulation: Dominant right vertebral artery. Fetal type bilateral PCA. At least  moderate narrowing at the left P2 segment. Moderate mid basilar stenosis. No emergent branch occlusion, beading, or aneurysm.   Venous sinuses: Patent   Anatomic variants: As above   Review of the MIP images confirms the above findings   IMPRESSION: 1. No emergent finding. 2. Intracranial atherosclerosis with most notable stenoses affecting the mid basilar, left proximal PCA, and right A1 origin. 3. No flow limiting stenosis or embolic source seen in the neck.    ASSESSMENT/PLAN: KASHMERE STAFFA is a 66 y.o. male presenting status post sudden stroke with right-sided deficit.  Imaging was reviewed demonstrating mild extracranial carotid disease.  There is no flow-limiting stenosis appreciated.  Cranial disease appears more prominent with an area of stenosis in the basilar artery, and left PCA. The etiology of Caydan stroke is currently idiopathic.  He has appointments with cardiology to follow-up his Holter monitor as well as neurology for follow-up.  Joe's extracranial carotid disease can be followed on a yearly basis.  I plan to obtain a bilateral carotid duplex ultrasound in the coming weeks to have a baseline.  Current extracranial carotid disease does not warrant intervention, as stenosis is less than 50%.  Dorene Sorrow would benefit from neurosurgical evaluation to follow-up intracranial stenosis. Asked that he continue his current medication regimen and call my office should any questions or concerns arise.   Victorino Sparrow, MD [facility] [phone]

## 2022-05-10 ENCOUNTER — Encounter: Payer: Self-pay | Admitting: Vascular Surgery

## 2022-05-10 ENCOUNTER — Ambulatory Visit (INDEPENDENT_AMBULATORY_CARE_PROVIDER_SITE_OTHER): Payer: HMO | Admitting: Vascular Surgery

## 2022-05-10 VITALS — BP 145/93 | HR 74 | Temp 97.8°F | Resp 18 | Ht 72.0 in | Wt 210.0 lb

## 2022-05-10 DIAGNOSIS — I6522 Occlusion and stenosis of left carotid artery: Secondary | ICD-10-CM

## 2022-05-13 ENCOUNTER — Ambulatory Visit: Payer: HMO | Admitting: Neurology

## 2022-05-13 ENCOUNTER — Encounter: Payer: Self-pay | Admitting: Neurology

## 2022-05-13 VITALS — BP 151/95 | HR 86 | Ht 72.0 in | Wt 210.0 lb

## 2022-05-13 DIAGNOSIS — I10 Essential (primary) hypertension: Secondary | ICD-10-CM

## 2022-05-13 DIAGNOSIS — E785 Hyperlipidemia, unspecified: Secondary | ICD-10-CM | POA: Diagnosis not present

## 2022-05-13 DIAGNOSIS — I639 Cerebral infarction, unspecified: Secondary | ICD-10-CM

## 2022-05-13 NOTE — Therapy (Signed)
OUTPATIENT PHYSICAL THERAPY NEURO EVALUATION   Patient Name: Ivan Lawson MRN: [id number] DOB:03-Sep-1956, 66 y.o., male Today's Date: 05/14/2022   PCP: Blair Heys REFERRING PROVIDER: Mariam Dollar   PT End of Session - 05/14/22 1459     Visit Number 2    Date for PT Re-Evaluation 07/02/22    Progress Note Due on Visit 10    PT Start Time 1500    PT Stop Time 1545    PT Time Calculation (min) 45 min    Equipment Utilized During Treatment Gait belt    Activity Tolerance Patient tolerated treatment well    Behavior During Therapy WFL for tasks assessed/performed              Past Medical History:  Diagnosis Date   Hyperthyroidism    Past Surgical History:  Procedure Laterality Date   BACK SURGERY  1990   FINGER SURGERY     Patient Active Problem List   Diagnosis Date Noted   Left pontine cerebrovascular accident (HCC) 04/16/2022   Basilar artery stenosis 04/13/2022   Stroke-like symptom 04/12/2022   Cerebral thrombosis with cerebral infarction 04/12/2022   Acute ischemic stroke (HCC) 04/06/2022   Sinus bradycardia 04/06/2022   Erythrocytosis 04/06/2022   Acquired hypothyroidism 04/06/2022   B12 deficiency 01/07/2018   Hypothyroidism following radioiodine therapy 09/25/2016   Elevated PSA 05/03/2016   Numbness 03/22/2016   HTN (hypertension) 11/22/2015   Bilateral hearing loss 11/22/2015   Tinnitus 11/22/2015    ONSET DATE: 04/06/22  REFERRING DIAG: I63.9  THERAPY DIAG:  Hemiplegia and hemiparesis following cerebral infarction affecting right dominant side (HCC)  Left pontine cerebrovascular accident (HCC)  Other lack of coordination  Other abnormalities of gait and mobility  Rationale for Evaluation and Treatment Rehabilitation  SUBJECTIVE:                                                                                                                                                                                              SUBJECTIVE  STATEMENT: Patient states he has been getting better, getting more stamina and can walk further but still has some trouble with balance. Walked 6500 steps yesterday.  Pt accompanied by: self  PERTINENT HISTORY: Acute L paramedian pons infarct w/ increased extent of involvement compared w/ 04/06/22  PMH includes gout that flared up last Wednesday, HTN, acquired hypothyroidism following radioactive iodine therapy for Graves' disease, and prediabetes  PAIN:  Are you having pain? Yes: NPRS scale: 0/10  PRECAUTIONS: Fall  WEIGHT BEARING RESTRICTIONS No  FALLS: Has patient fallen in last 6 months? No  LIVING ENVIRONMENT: Lives with: lives alone  Lives in: House/apartment Stairs: Yes: External: 5 steps; can reach both Has following equipment at home: Walker - 4 wheeled and Wheelchair (manual)  PLOF: Independent, retired  PATIENT GOALS be able to walk naturally, 10,000-12,000 steps a day again  OBJECTIVE:   UPPER EXTREMITY ROM: WFL UPPER EXTREMITY MMT: 4+ flexion R  LOWER EXTREMITY ROM:  WFL  MMT Right Eval Left Eval  Hip flexion 4 4+  Hip extension    Hip abduction    Hip adduction    Hip internal rotation 4 4  Hip external rotation 4 4  Knee flexion 4- 4+  Knee extension 4- 4+  Ankle dorsiflexion 5 5  Ankle plantarflexion 5 5  Ankle inversion    Ankle eversion     (Blank rows = not tested)   GAIT: Gait pattern: decreased arm swing- Right, decreased step length- Right, decreased stance time- Right, decreased ankle dorsiflexion- Right, ataxic, trendelenburg, lateral lean- Left, and decreased trunk rotation Distance walked: 26050ft Assistive device utilized: None Level of assistance: Complete Independence Comments: Decreased step length, L shoulder height is elevated, favors LLE, trunk lean to L, trendelenberg , decreased speed  FUNCTIONAL TESTs:  5 times sit to stand: 12.55s Timed up and go (TUG): 11.05s Berg Balance Scale: TBD Functional gait assessment:  13/30 SLS L 2 secs at best R unable to do w/o support 4 stage balance (30s) EO firm EC firm min sway EO foam  EC foam CGA   TODAY'S TREATMENT:  Nustep L5, 8mins  Half tandem 30s  Tandem  L 15s   R 30s SLS  R 2,2,3  L 3, 4,4  Tandem walking 13steps x2 with modA  Walking backwards w/minA 2x6220ft Side steps and catch  Step ups 6" no support, minA  Resisted gait forwards and sideways 20# minA   PATIENT EDUCATION: Education details: POC, fall risk Person educated: Patient Education method: Explanation and Verbal cues Education comprehension: verbalized understanding and returned demonstration   HOME EXERCISE PROGRAM: TBD   GOALS: Goals reviewed with patient? Yes  SHORT TERM GOALS: Target date: 06/05/22  Patient will be independent with initial HEP. Goal status: INITIAL  2.  Patient will be educated on strategies to decrease risk of falls.  Goal status: INITIAL   LONG TERM GOALS: Target date: 07/02/22  Patient will be independent with advanced/ongoing HEP to improve outcomes and carryover.  Goal status: INITIAL  2.  Patient will demonstrate at least 20/30 on FGA to improve gait stability and reduce risk for falls. Baseline: 13/30 Goal status: INITIAL  3.  Patient will demonstrate decreased fall risk by scoring < 12 sec on 5xSTS. Goal status: INITIAL  4. Patient will demonstrate SLS >10 secs to decrease fall risk and improve balance.    Goal status: INITIAL   ASSESSMENT:  CLINICAL IMPRESSION: Patient returns to PT in good spirits, feeling like he has made good improvements so far. He has been practicing SL and tandem standing at home. He is walking on uneven terrains in the yard to work on balance. He has significant LOB with SLS without support and with tandem walking that requires mod-maxA from PT to keep patient upright. Patient tends to lose his balance by leaning towards his affected side (R). Ended session with resisted gait to work on strength and  balance, patient able to complete with minA and verbal cues.     OBJECTIVE IMPAIRMENTS Abnormal gait, decreased balance, decreased coordination, and decreased strength.   ACTIVITY LIMITATIONS transfers and locomotion level  PARTICIPATION LIMITATIONS:  driving, community activity, and yard work  PERSONAL FACTORS 1-2 comorbidities: HTN, hx of strokes  are also affecting patient's functional outcome.   REHAB POTENTIAL: Good  CLINICAL DECISION MAKING: Stable/uncomplicated  EVALUATION COMPLEXITY: Low  PLAN: PT FREQUENCY: 2x/week  PT DURATION: 8 weeks  PLANNED INTERVENTIONS: Therapeutic exercises, Therapeutic activity, Neuromuscular re-education, Balance training, Gait training, Patient/Family education, Joint mobilization, Stair training, Cryotherapy, Moist heat, Biofeedback, and Re-evaluation  PLAN FOR NEXT SESSION: BERG balance, static and dynamic balance activities, cognitive dual tasking   Junior, PT 05/14/2022, 3:51 PM

## 2022-05-13 NOTE — Patient Instructions (Addendum)
Continue current medications   Aspirin and Plavix for a total 2 months then go back to aspirin alone No major deficit noted in exam, Ok to resume driving  Follow up in a year

## 2022-05-13 NOTE — Progress Notes (Signed)
[facility]  PATIENT: Ivan Lawson DOB: 1956/08/27  REQUESTING CLINICIAN: Blair Heys, MD HISTORY FROM: Patient  REASON FOR VISIT: Left pontine stroke    HISTORICAL  CHIEF COMPLAINT:  Chief Complaint  Patient presents with   New Patient (Initial Visit)    Room 15, alone C/o weakness and numbness in right hand, some balance issues     HISTORY OF PRESENT ILLNESS:  This is a 66 year old gentleman past medical history of hypertension, hyperlipidemia, hypothyroidism and B12 deficiency who is presenting after being discharged from the hospital for left paramedian stroke.  Patient initially presented to the hospital on May 6 with complaint of numbness of the right hand as well as to right foot, his initial work-up showed left paramedian stroke.  He was stable and discharged home on aspirin and Plavix.  He presented again 4 days later with worsening of his symptoms and MRI at that time showed increased area of the stroke.  He was switched from Aspirin/Plavix to aspirin and Brilinta for 1 month followed by Aspirin/Plavix for 2 months and then Aspirin alone.  He was also sent to rehab. Patient reported he completed rehab and that right now he is doing much better. There is improvement of his weakness and numbness of the right side.  He reported his recovery was slowed down by acute gout flares but currently he is doing well.  Denies any falls.  He reported he started to feel little unsteady but his gait has much improved.  He continue physical therapy twice a week.  Currently he completed aspirin Brilinta, and right now he is on aspirin and Plavix.  He understands to continue with Aspirin/Plavix for 2-months then followed by Aspirin alone.    Hospital summary  MRI confirms worsening of previously diagnosed left paramedian pontine infarct. CTA head and neck significant for stenosis of mid basilar, left proximal PCA and right A1 origin. LDL of 101. Hemoglobin A1C of 6.0%.  Repeat Transthoracic Echocardiogram not obtained. Neurology recommendations for Aspirin/Brilinta for one (1) month, followed by Aspirin/Plavix for two (2) months followed by aspirin monotherapy. PT/OT recommendations for inpatient rehabilitation.  OTHER MEDICAL CONDITIONS: Hypertension, Hyperlipidemia, Hypothyroidism, B12 deficiency    REVIEW OF SYSTEMS: Full 14 system review of systems performed and negative with exception of: as noted in the HPI   ALLERGIES: Allergies  Allergen Reactions   Benadryl [Diphenhydramine] Anxiety    HOME MEDICATIONS: Outpatient Medications Prior to Visit  Medication Sig Dispense Refill   acetaminophen (TYLENOL) 325 MG tablet Take 1-2 tablets (325-650 mg total) by mouth every 4 (four) hours as needed for mild pain.     amLODipine (NORVASC) 10 MG tablet Take 10 mg by mouth daily.     aspirin 81 MG EC tablet Take 1 tablet (81 mg total) by mouth daily. Swallow whole. 30 tablet 11   clopidogrel (PLAVIX) 75 MG tablet Take 1 tablet (75 mg total) by mouth daily. Starting on 6/11, take for 60 days (2 months) 30 tablet 1   diclofenac Sodium (VOLTAREN) 1 % GEL Apply 2 g topically 4 (four) times daily.     levothyroxine (SYNTHROID) 137 MCG tablet Take 137 mcg by mouth every morning.     methocarbamol (ROBAXIN) 500 MG tablet Take 1 tablet (500 mg total) by mouth every 6 (six) hours as needed for muscle spasms. 60 tablet 0   rosuvastatin (CRESTOR) 20 MG tablet Take 1 tablet (20 mg total) by mouth daily. 30 tablet 0   vitamin B-12 (CYANOCOBALAMIN) 1000  MCG tablet Take 1,000 mcg by mouth daily.     amLODipine (NORVASC) 2.5 MG tablet Take 1 tablet (2.5 mg total) by mouth daily. 30 tablet 0   hydrALAZINE (APRESOLINE) 50 MG tablet hydralazine 50 mg tablet (Patient not taking: Reported on 05/08/2022)     predniSONE (STERAPRED UNI-PAK 21 TAB) 10 MG (21) TBPK tablet prednisone 10 mg tablets in a dose pack  Take 1 dose pk by oral route. (Patient not taking: Reported on 05/10/2022)      No facility-administered medications prior to visit.    PAST MEDICAL HISTORY: Past Medical History:  Diagnosis Date   Hyperthyroidism     PAST SURGICAL HISTORY: Past Surgical History:  Procedure Laterality Date   BACK SURGERY  1990   FINGER SURGERY      FAMILY HISTORY: Family History  Problem Relation Age of Onset   Thyroid disease Neg Hx     SOCIAL HISTORY: Social History   Socioeconomic History   Marital status: Single    Spouse name: Not on file   Number of children: Not on file   Years of education: Not on file   Highest education level: Not on file  Occupational History   Not on file  Tobacco Use   Smoking status: Never   Smokeless tobacco: Never  Vaping Use   Vaping Use: Never used  Substance and Sexual Activity   Alcohol use: Not Currently   Drug use: Not Currently   Sexual activity: Not on file  Other Topics Concern   Not on file  Social History Narrative   Not on file   Social Determinants of Health   Financial Resource Strain: Not on file  Food Insecurity: Not on file  Transportation Needs: Not on file  Physical Activity: Not on file  Stress: Not on file  Social Connections: Not on file  Intimate Partner Violence: Not on file    PHYSICAL EXAM  GENERAL EXAM/CONSTITUTIONAL: Vitals:  Vitals:   05/13/22 1410  BP: (!) 151/95  Pulse: 86  Weight: 210 lb (95.3 kg)  Height: 6' (1.829 m)   Body mass index is 28.48 kg/m. Wt Readings from Last 3 Encounters:  05/13/22 210 lb (95.3 kg)  05/10/22 210 lb (95.3 kg)  05/08/22 212 lb (96.2 kg)   Patient is in no distress; well developed, nourished and groomed; neck is supple  EYES: Pupils round and reactive to light, Visual fields full to confrontation, Extraocular movements intacts,   MUSCULOSKELETAL: Gait, strength, tone, movements noted in Neurologic exam below  NEUROLOGIC: MENTAL STATUS:      No data to display         awake, alert, oriented to person, place and time recent  and remote memory intact normal attention and concentration language fluent, comprehension intact, naming intact fund of knowledge appropriate  CRANIAL NERVE:  2nd, 3rd, 4th, 6th - pupils equal and reactive to light, visual fields full to confrontation, extraocular muscles intact, no nystagmus 5th - facial sensation symmetric 7th - facial strength symmetric 8th - hearing intact 9th - palate elevates symmetrically, uvula midline 11th - shoulder shrug symmetric 12th - tongue protrusion midline  MOTOR:  normal bulk and tone, full strength in the BUE, BLE  SENSORY:  normal and symmetric to light touch, pinprick, temperature, vibration  COORDINATION:  finger-nose-finger, fine finger movements normal  REFLEXES:  deep tendon reflexes present and symmetric  GAIT/STATION:  normal   DIAGNOSTIC DATA (LABS, IMAGING, TESTING) - I reviewed patient records, labs, notes, testing and imaging  myself where available.  Lab Results  Component Value Date   WBC 6.6 04/23/2022   HGB 15.0 04/23/2022   HCT 44.3 04/23/2022   MCV 87.5 04/23/2022   PLT 225 04/23/2022      Component Value Date/Time   NA 138 04/24/2022 0541   K 4.4 04/24/2022 0541   CL 106 04/24/2022 0541   CO2 23 04/24/2022 0541   GLUCOSE 93 04/24/2022 0541   BUN 20 04/24/2022 0541   CREATININE 1.40 (H) 04/24/2022 0541   CALCIUM 9.2 04/24/2022 0541   PROT 6.5 04/17/2022 0624   ALBUMIN 3.6 04/17/2022 0624   AST 24 04/17/2022 0624   ALT 23 04/17/2022 0624   ALKPHOS 51 04/17/2022 0624   BILITOT 1.2 04/17/2022 0624   GFRNONAA 55 (L) 04/24/2022 0541   Lab Results  Component Value Date   CHOL 153 04/07/2022   HDL 38 (L) 04/07/2022   LDLCALC 101 (H) 04/07/2022   TRIG 72 04/07/2022   CHOLHDL 4.0 04/07/2022   Lab Results  Component Value Date   HGBA1C 6.0 (H) 04/07/2022   Lab Results  Component Value Date   VITAMINB12 180 (L) 03/22/2016   Lab Results  Component Value Date   TSH 1.411 04/07/2022    Code Stroke  CT head No acute abnormality. Small vessel disease. Known left pontine infarct. ASPECTS 10.    CTA head & neck Stenoses in mid basilar, left proximal PCA and right A1 origin MRI extension of previous left pontine infarct 2D Echo EF 60-65%, no atrial level shunt LDL 101 HgbA1c 6.0   ASSESSMENT AND PLAN  66 y.o. year old male with stroke risk factors including hypertension, hyperlipidemia who is presenting following a left pontine stroke.  He completed physical therapy and currently has no deficit on exam.  His gait is normal.  Stroke risk factor including hyperlipidemia, his LDL is 101, patient is on Crestor 20, he does not have diabetes and his hemoglobin A1c is 6.0.  At this time I advised patient to continue physical therapy, to continue follow-up with the primary care doctor.  He understands to continue aspirin and Plavix for 2 months then Plavix alone.  I told him since he does not have any deficit on exam, it is okay for him to resume driving.  Continue to follow with your primary care doctor and I will see you in 1 year for follow-up or sooner if worse.   1. Left pontine cerebrovascular accident (HCC)   2. Hypertension, unspecified type   3. Hyperlipidemia, unspecified hyperlipidemia type      Patient Instructions  Continue current medications   Aspirin and Plavix for a total 2 months then go back to aspirin alone No major deficit noted in exam, Ok to resume driving  Follow up in a year   No orders of the defined types were placed in this encounter.   No orders of the defined types were placed in this encounter.   Return in about 1 year (around 05/14/2023).  I have spent a total of 50 minutes dedicated to this patient today, preparing to see patient, performing a medically appropriate examination and evaluation, ordering tests and/or medications and procedures, and counseling and educating the patient/family/caregiver; independently interpreting result and communicating results  to the family/patient/caregiver; and documenting clinical information in the electronic medical record.   Windell NorfolkAmadou Nevaeh Casillas, MD 05/13/2022, 5:26 PM  [facility] [address], [address] [place], [place] [address] [phone]

## 2022-05-14 ENCOUNTER — Ambulatory Visit: Payer: PPO

## 2022-05-14 DIAGNOSIS — I639 Cerebral infarction, unspecified: Secondary | ICD-10-CM

## 2022-05-14 DIAGNOSIS — R2689 Other abnormalities of gait and mobility: Secondary | ICD-10-CM

## 2022-05-14 DIAGNOSIS — I69351 Hemiplegia and hemiparesis following cerebral infarction affecting right dominant side: Secondary | ICD-10-CM

## 2022-05-14 DIAGNOSIS — R278 Other lack of coordination: Secondary | ICD-10-CM

## 2022-05-16 ENCOUNTER — Ambulatory Visit: Payer: PPO

## 2022-05-16 DIAGNOSIS — I639 Cerebral infarction, unspecified: Secondary | ICD-10-CM

## 2022-05-16 DIAGNOSIS — R278 Other lack of coordination: Secondary | ICD-10-CM

## 2022-05-16 DIAGNOSIS — R2689 Other abnormalities of gait and mobility: Secondary | ICD-10-CM

## 2022-05-16 DIAGNOSIS — I69351 Hemiplegia and hemiparesis following cerebral infarction affecting right dominant side: Secondary | ICD-10-CM

## 2022-05-16 NOTE — Therapy (Signed)
OUTPATIENT PHYSICAL THERAPY NEURO EVALUATION   Patient Name: Ivan Lawson MRN: [id number] DOB:[birth year], 66 y.o., male Today's Date: 05/16/2022   PCP: Blair Heys REFERRING PROVIDER: Mariam Dollar   PT End of Session - 05/16/22 1240     Visit Number 3    Date for PT Re-Evaluation 07/02/22    Progress Note Due on Visit 10    PT Start Time 1230    PT Stop Time 1311    PT Time Calculation (min) 41 min    Equipment Utilized During Treatment Gait belt    Activity Tolerance Patient tolerated treatment well    Behavior During Therapy WFL for tasks assessed/performed               Past Medical History:  Diagnosis Date   Hyperthyroidism    Past Surgical History:  Procedure Laterality Date   BACK SURGERY  1990   FINGER SURGERY     Patient Active Problem List   Diagnosis Date Noted   Left pontine cerebrovascular accident (HCC) 04/16/2022   Basilar artery stenosis 04/13/2022   Stroke-like symptom 04/12/2022   Cerebral thrombosis with cerebral infarction 04/12/2022   Acute ischemic stroke (HCC) 04/06/2022   Sinus bradycardia 04/06/2022   Erythrocytosis 04/06/2022   Acquired hypothyroidism 04/06/2022   B12 deficiency 01/07/2018   Hypothyroidism following radioiodine therapy 09/25/2016   Elevated PSA 05/03/2016   Numbness 03/22/2016   HTN (hypertension) 11/22/2015   Bilateral hearing loss 11/22/2015   Tinnitus 11/22/2015    ONSET DATE: 04/06/22  REFERRING DIAG: I63.9  THERAPY DIAG:  Hemiplegia and hemiparesis following cerebral infarction affecting right dominant side (HCC)  Left pontine cerebrovascular accident (HCC)  Other lack of coordination  Other abnormalities of gait and mobility  Rationale for Evaluation and Treatment Rehabilitation  SUBJECTIVE:                                                                                                                                                                                               SUBJECTIVE STATEMENT: Patient states he is wonderful, reports no falls or no pain.   Pt accompanied by: self  PERTINENT HISTORY: Acute L paramedian pons infarct w/ increased extent of involvement compared w/ 04/06/22  PMH includes gout that flared up last Wednesday, HTN, acquired hypothyroidism following radioactive iodine therapy for Graves' disease, and prediabetes  PAIN:  Are you having pain? Yes: NPRS scale: 0/10  PRECAUTIONS: Fall  WEIGHT BEARING RESTRICTIONS No  FALLS: Has patient fallen in last 6 months? No  LIVING ENVIRONMENT: Lives with: lives alone Lives in: House/apartment Stairs: Yes: External: 5 steps; can reach both Has  following equipment at home: Walker - 4 wheeled and Wheelchair (manual)  PLOF: Independent, retired  PATIENT GOALS be able to walk naturally, 10,000-12,000 steps a day again  OBJECTIVE:   UPPER EXTREMITY ROM: WFL UPPER EXTREMITY MMT: 4+ flexion R  LOWER EXTREMITY ROM:  WFL  MMT Right Eval Left Eval  Hip flexion 4 4+  Hip extension    Hip abduction    Hip adduction    Hip internal rotation 4 4  Hip external rotation 4 4  Knee flexion 4- 4+  Knee extension 4- 4+  Ankle dorsiflexion 5 5  Ankle plantarflexion 5 5  Ankle inversion    Ankle eversion     (Blank rows = not tested)   GAIT: Gait pattern: decreased arm swing- Right, decreased step length- Right, decreased stance time- Right, decreased ankle dorsiflexion- Right, ataxic, trendelenburg, lateral lean- Left, and decreased trunk rotation Distance walked: 263ft Assistive device utilized: None Level of assistance: Complete Independence Comments: Decreased step length, L shoulder height is elevated, favors LLE, trunk lean to L, trendelenberg , decreased speed  FUNCTIONAL TESTs:  5 times sit to stand: 12.55s Timed up and go (TUG): 11.05s Berg Balance Scale: TBD Functional gait assessment: 13/30 SLS L 2 secs at best R unable to do w/o support 4 stage balance (30s) EO  firm EC firm min sway EO foam  EC foam CGA   TODAY'S TREATMENT:  05/16/22 Nustep L5, 8 mins Tandem walking modA, 2 LOB  Catching forwards, back, sideways  Side steps on foam, minA Walking on balance beam in parallel bars  Resisted gait 25# forwards/sideways, 15# backwards  05/24/22 Nustep L5, 22mins  Half tandem 30s  Tandem  L 15s   R 30s SLS  R 2,2,3  L 3, 4,4  Tandem walking 13steps x2 with modA  Walking backwards w/minA 2x61ft Side steps and catch  Step ups 6" no support, minA  Resisted gait forwards and sideways 20# minA   PATIENT EDUCATION: Education details: POC, fall risk Person educated: Patient Education method: Explanation and Verbal cues Education comprehension: verbalized understanding and returned demonstration   HOME EXERCISE PROGRAM: TBD   GOALS: Goals reviewed with patient? Yes  SHORT TERM GOALS: Target date: 06/05/22  Patient will be independent with initial HEP. Goal status: INITIAL  2.  Patient will be educated on strategies to decrease risk of falls.  Goal status: INITIAL   LONG TERM GOALS: Target date: 07/02/22  Patient will be independent with advanced/ongoing HEP to improve outcomes and carryover.  Goal status: INITIAL  2.  Patient will demonstrate at least 20/30 on FGA to improve gait stability and reduce risk for falls. Baseline: 13/30 Goal status: INITIAL  3.  Patient will demonstrate decreased fall risk by scoring < 12 sec on 5xSTS. Goal status: INITIAL  4. Patient will demonstrate SLS >10 secs to decrease fall risk and improve balance.    Goal status: INITIAL   ASSESSMENT:  CLINICAL IMPRESSION: Patient is doing well and able to get through PT session with good motivation. He continues to present with balance deficits and has trouble mostly with tandem walking. He states he is still having the most trouble with single leg stance on his R leg. Tried walking forwards and sideways on balance beam in parallel bars, and he is  able to do it without holding on and CGA. Was able to do resisted gait with more control, had the most difficulty with backwards walking.     OBJECTIVE IMPAIRMENTS Abnormal gait, decreased balance,  decreased coordination, and decreased strength.   ACTIVITY LIMITATIONS transfers and locomotion level  PARTICIPATION LIMITATIONS: driving, community activity, and yard work  PERSONAL FACTORS 1-2 comorbidities: HTN, hx of strokes  are also affecting patient's functional outcome.   REHAB POTENTIAL: Good  CLINICAL DECISION MAKING: Stable/uncomplicated  EVALUATION COMPLEXITY: Low  PLAN: PT FREQUENCY: 2x/week  PT DURATION: 8 weeks  PLANNED INTERVENTIONS: Therapeutic exercises, Therapeutic activity, Neuromuscular re-education, Balance training, Gait training, Patient/Family education, Joint mobilization, Stair training, Cryotherapy, Moist heat, Biofeedback, and Re-evaluation  PLAN FOR NEXT SESSION: BERG balance, static and dynamic balance activities, cognitive dual tasking   [place], PT 05/16/2022, 1:13 PM

## 2022-05-20 ENCOUNTER — Ambulatory Visit: Payer: PPO

## 2022-05-20 ENCOUNTER — Ambulatory Visit: Payer: PPO | Admitting: Occupational Therapy

## 2022-05-20 ENCOUNTER — Encounter: Payer: Self-pay | Admitting: Occupational Therapy

## 2022-05-20 DIAGNOSIS — R2689 Other abnormalities of gait and mobility: Secondary | ICD-10-CM

## 2022-05-20 DIAGNOSIS — I639 Cerebral infarction, unspecified: Secondary | ICD-10-CM | POA: Diagnosis not present

## 2022-05-20 DIAGNOSIS — R278 Other lack of coordination: Secondary | ICD-10-CM

## 2022-05-20 DIAGNOSIS — I69351 Hemiplegia and hemiparesis following cerebral infarction affecting right dominant side: Secondary | ICD-10-CM

## 2022-05-20 NOTE — Therapy (Signed)
OUTPATIENT PHYSICAL THERAPY NEURO TREATMENT   Patient Name: Ivan Lawson MRN: [id number] DOB:14-Sep-1956, 66 y.o., male Today's Date: 05/20/2022   PCP: Blair Heys REFERRING PROVIDER: Mariam Dollar   PT End of Session - 05/20/22 1359     Visit Number 4    Date for PT Re-Evaluation 07/02/22    Progress Note Due on Visit 10    PT Start Time 1404    PT Stop Time 1445    PT Time Calculation (min) 41 min    Equipment Utilized During Treatment Gait belt    Activity Tolerance Patient tolerated treatment well    Behavior During Therapy WFL for tasks assessed/performed                Past Medical History:  Diagnosis Date   Hyperthyroidism    Past Surgical History:  Procedure Laterality Date   BACK SURGERY  1990   FINGER SURGERY     Patient Active Problem List   Diagnosis Date Noted   Left pontine cerebrovascular accident (HCC) 04/16/2022   Basilar artery stenosis 04/13/2022   Stroke-like symptom 04/12/2022   Cerebral thrombosis with cerebral infarction 04/12/2022   Acute ischemic stroke (HCC) 04/06/2022   Sinus bradycardia 04/06/2022   Erythrocytosis 04/06/2022   Acquired hypothyroidism 04/06/2022   B12 deficiency 01/07/2018   Hypothyroidism following radioiodine therapy 09/25/2016   Elevated PSA 05/03/2016   Numbness 03/22/2016   HTN (hypertension) 11/22/2015   Bilateral hearing loss 11/22/2015   Tinnitus 11/22/2015    ONSET DATE: 04/06/22  REFERRING DIAG: I63.9  THERAPY DIAG:  Hemiplegia and hemiparesis following cerebral infarction affecting right dominant side (HCC)  Left pontine cerebrovascular accident (HCC)  Other lack of coordination  Other abnormalities of gait and mobility  Rationale for Evaluation and Treatment Rehabilitation  SUBJECTIVE:                                                                                                                                                                                               SUBJECTIVE STATEMENT: Patient says he is feeling well and his balance is a work in progress.   Pt accompanied by: self  PERTINENT HISTORY: Acute L paramedian pons infarct w/ increased extent of involvement compared w/ 04/06/22  PMH includes gout that flared up last Wednesday, HTN, acquired hypothyroidism following radioactive iodine therapy for Graves' disease, and prediabetes  PAIN:  Are you having pain? Yes: NPRS scale: 0/10  PRECAUTIONS: Fall  WEIGHT BEARING RESTRICTIONS No  FALLS: Has patient fallen in last 6 months? No  LIVING ENVIRONMENT: Lives with: lives alone Lives in: House/apartment Stairs: Yes: External: 5 steps;  can reach both Has following equipment at home: Dan Humphreys - 4 wheeled and Wheelchair (manual)  PLOF: Independent, retired  PATIENT GOALS be able to walk naturally, 10,000-12,000 steps a day again  OBJECTIVE:   UPPER EXTREMITY ROM: WFL UPPER EXTREMITY MMT: 4+ flexion R  LOWER EXTREMITY ROM:  WFL  MMT Right Eval Left Eval  Hip flexion 4 4+  Hip extension    Hip abduction    Hip adduction    Hip internal rotation 4 4  Hip external rotation 4 4  Knee flexion 4- 4+  Knee extension 4- 4+  Ankle dorsiflexion 5 5  Ankle plantarflexion 5 5  Ankle inversion    Ankle eversion     (Blank rows = not tested)   GAIT: Gait pattern: decreased arm swing- Right, decreased step length- Right, decreased stance time- Right, decreased ankle dorsiflexion- Right, ataxic, trendelenburg, lateral lean- Left, and decreased trunk rotation Distance walked: 283ft Assistive device utilized: None Level of assistance: Complete Independence Comments: Decreased step length, L shoulder height is elevated, favors LLE, trunk lean to L, trendelenberg , decreased speed  FUNCTIONAL TESTs:  5 times sit to stand: 12.55s Timed up and go (TUG): 11.05s Berg Balance Scale: TBD Functional gait assessment: 13/30 SLS L 2 secs at best R unable to do w/o support 4 stage balance  (30s) EO firm EC firm min sway EO foam  EC foam CGA   TODAY'S TREATMENT:  05/20/22 Nustep L5, 6 mins  Tandem walking w/modA Catch w/yellow ball standing on foam 20reps Walking on balance beam forwards/backwards/sideways SLS at best 7 on L, 5 on R Banded walks    05/16/22 Nustep L5, 8 mins Tandem walking modA, 2 LOB  Catching forwards, back, sideways  Side steps on foam, minA Walking on balance beam in parallel bars  Resisted gait 25# forwards/sideways, 15# backwards  05/24/22 Nustep L5,  Half tandem 30s  Tandem  L 15s   R 30s SLS  R 2,2,3  L 3, 4,4  Tandem walking 13steps x2 with modA  Walking backwards w/minA 2x56ft Side steps and catch  Step ups 6" no support, minA  Resisted gait forwards and sideways 20# minA   PATIENT EDUCATION: Education details: POC, fall risk Person educated: Patient Education method: Explanation and Verbal cues Education comprehension: verbalized understanding and returned demonstration   HOME EXERCISE PROGRAM: TBD   GOALS: Goals reviewed with patient? Yes  SHORT TERM GOALS: Target date: 06/05/22  Patient will be independent with initial HEP. Goal status: INITIAL  2.  Patient will be educated on strategies to decrease risk of falls.  Goal status: INITIAL   LONG TERM GOALS: Target date: 07/02/22  Patient will be independent with advanced/ongoing HEP to improve outcomes and carryover.  Goal status: INITIAL  2.  Patient will demonstrate at least 20/30 on FGA to improve gait stability and reduce risk for falls. Baseline: 13/30 Goal status: INITIAL  3.  Patient will demonstrate decreased fall risk by scoring < 12 sec on 5xSTS. Goal status: INITIAL  4. Patient will demonstrate SLS >10 secs to decrease fall risk and improve balance.    Goal status: INITIAL   ASSESSMENT:  CLINICAL IMPRESSION: Patient continues to have difficulty with tandem walking and expresses frustration with it. He still requires modA and can take  4 steps at best before losing balance. He demonstrates difficulty with R foot placement with tandem walking and walking across balance beam. Continued practicing SLS, and he states he can feel it has gotten a  little bit better. Patient continues to show good motivation and is progressing well each visit.    OBJECTIVE IMPAIRMENTS Abnormal gait, decreased balance, decreased coordination, and decreased strength.   ACTIVITY LIMITATIONS transfers and locomotion level  PARTICIPATION LIMITATIONS: driving, community activity, and yard work  PERSONAL FACTORS 1-2 comorbidities: HTN, hx of strokes  are also affecting patient's functional outcome.   REHAB POTENTIAL: Good  CLINICAL DECISION MAKING: Stable/uncomplicated  EVALUATION COMPLEXITY: Low  PLAN: PT FREQUENCY: 2x/week  PT DURATION: 8 weeks  PLANNED INTERVENTIONS: Therapeutic exercises, Therapeutic activity, Neuromuscular re-education, Balance training, Gait training, Patient/Family education, Joint mobilization, Stair training, Cryotherapy, Moist heat, Biofeedback, and Re-evaluation  PLAN FOR NEXT SESSION: BERG balance, static and dynamic balance activities, cognitive dual tasking   [place], PT 05/20/2022, 2:48 PM

## 2022-05-20 NOTE — Therapy (Signed)
OUTPATIENT OCCUPATIONAL THERAPY TREATMENT NOTE  Patient Name: Ivan Lawson MRN: [id number] DOB:[birth year], 66 y.o., male Today's Date: 05/20/2022  PCP: Blair Heys, MD REFERRING PROVIDER: Charlton Amor, PA-C    OT End of Session - 05/20/22 1321     Visit Number 2    Number of Visits 17    Date for OT Re-Evaluation 07/26/22    Authorization Type [employer]    Authorization Time Period VL: [place]    Progress Note Due on Visit 10    OT Start Time 1319    OT Stop Time 1402    OT Time Calculation (min) 43 min    Activity Tolerance Patient tolerated treatment well    Behavior During Therapy WFL for tasks assessed/performed            Past Medical History:  Diagnosis Date   Hyperthyroidism    Past Surgical History:  Procedure Laterality Date   BACK SURGERY  1990   FINGER SURGERY     Patient Active Problem List   Diagnosis Date Noted   Left pontine cerebrovascular accident (HCC) 04/16/2022   Basilar artery stenosis 04/13/2022   Stroke-like symptom 04/12/2022   Cerebral thrombosis with cerebral infarction 04/12/2022   Acute ischemic stroke (HCC) 04/06/2022   Sinus bradycardia 04/06/2022   Erythrocytosis 04/06/2022   Acquired hypothyroidism 04/06/2022   B12 deficiency 01/07/2018   Hypothyroidism following radioiodine therapy 09/25/2016   Elevated PSA 05/03/2016   Numbness 03/22/2016   HTN (hypertension) 11/22/2015   Bilateral hearing loss 11/22/2015   Tinnitus 11/22/2015    ONSET DATE: 04/12/22  REFERRING DIAG: I63.9 (ICD-10-CM) - Cerebral infarction, unspecified   THERAPY DIAG: Hemiplegia and hemiparesis following cerebral infarction affecting right dominant side (HCC)  Left pontine cerebrovascular accident (HCC)  Other lack of coordination  Other abnormalities of gait and mobility  Rationale for Evaluation and Treatment Rehabilitation  SUBJECTIVE:   SUBJECTIVE STATEMENT: Pt reports he is back to driving short distances (released by  neurology last week) and is doing okay, but feels like he's having a little trouble w/ smoothness when managing how hard he is pressing the brake/gas pedals Pt accompanied by: self  PAIN: Are you having pain? 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; Midwife  PATIENT GOALS: Get dexterity back in R hand; return to fishing, throwing a baseball  OBJECTIVE:   TODAY'S TREATMENT - 05/20/22: Using R hand to pick up easy-grip pegs one at a time and rotate in-hand to place into pegboard; completed 1 set(s) of 20 w/ 2 drops  Picking up 3-5 marbles one at a time w/ R hand, translating each to fingertips, and placing on easy-grip pegs to facilitate in-hand manipulation, FMC, and intrinsic hand strengthening. OT graded activity up from 3 marbles at a time to 5. Completed w/ min drops. After completing set of 20 marbles, completed additional set to return back to container Using resistance clothespin (red, 2#) to retrieve/place marbles on easy-grip pegs; activity facilitated force gradation of R hand, palmar pinch/intrinsic hand strengthening, and control/coordination. Completed 18 marbles w/ min drops Picking up 1" blocks off tabletop w/ R hand and placing in a stack with min cues for slow/controlled movements and facilitation of typical movement patterns; completed  3 stacks of 15, 17, then 20   PATIENT EDUCATION: Reviewed putty exercises for increased somatosensory input of R hand for NMR; continued condition-specific education Person educated: Patient Education method: Explanation Education comprehension: verbalized understanding   HOME  EXERCISE PROGRAM: To be administered   GOALS: Goals reviewed with patient? Yes  SHORT TERM GOALS: Target date: 06/16/22  STG  Status:  1 Pt will increase GMC of R, dominant UE, as evidenced by increasing Box and Blocks score by at least 4 Baseline: Right 36 blocks, Left 51 blocks Progressing  2 Pt will demonstrate independence w/ initial HEP designed for gross and fine motor control/coordination of RUE Progressing  3 Pt will improve grip strength of R hand to at least 60 lbs for improved functional use of R, dominant UE Baseline: Right: 53 lbs; Left: 100 lbs Progressing     LONG TERM GOALS: Target date: 07/26/22  LTG  Status:  1 Pt will improve score of UEFI to at least 64/80 to indicate improved functional use of RUE during all ADLs Baseline: 58/80 Progressing  2 Pt will improve grip strength of R hand to at least 75 lbs for improved functional use of R, dominant UE by d/c Baseline: Right: 53 lbs; Left: 100 lbs Progressing  3 Pt will increase GMC of R, dominant UE, as evidenced by increasing Box and Blocks score to within 4 blocks compared to L side Baseline: Right 36 blocks, Left 51 blocks Progressing  4 Pt to improve participation in functional FM tasks w/ R, dominant hand as evidenced by decreasing time to complete 9-HPT by at least 4 sec Baseline: Right: 38.4 sec; Left: 27.1 sec Progressing  5 Pt will demonstrate improved RUE GMC and coordination as evidenced by increasing accuracy w/ finger-to-nose test within 20 sec Baseline: to be assessed Progressing     ASSESSMENT:  CLINICAL IMPRESSION: Pt arrives for first treatment session following initial evaluation 2 weeks ago on 05/07/22. Pt appears to be doing well w/ no significant change observed in swelling of R index finger around DIPJ; to follow up w/ PCP. Pt also attended follow-up appts w/ PM&R, vasc surg, and neurology and was released for return to driving by neuro. Session today focused on NMR of GMC and [facility] of R hand w/ pt  demonstrating improved smoothness of movements compared to evaluation session. OT graded activities up according to success, incorporating higher level FM skills w/ pt experiencing most difficulty w/ in-hand translation and rotation. OT also addressed GMC, focusing on gradation of force application and accuracy w/ pt doing well w/ these exercises as well. Will likely introduce timed element next session during coordination activities to increase challenge.  PERFORMANCE DEFICITS in functional skills including ADLs, IADLs, coordination, dexterity, sensation, strength, FMC, GMC, mobility, balance, decreased knowledge of precautions, decreased knowledge of use of DME, and UE functional use, and cognitive skills including safety awareness.  IMPAIRMENTS are limiting patient from IADLs, work, and leisure.   COMORBIDITIES may have co-morbidities  that affects occupational performance. Patient will benefit from skilled OT to address above impairments and improve overall function.   PLAN: OT FREQUENCY: 2x/week  OT DURATION: 8 weeks  PLANNED INTERVENTIONS: self care/ADL training, therapeutic exercise, therapeutic activity, neuromuscular re-education, manual therapy, balance training, functional mobility training, electrical stimulation, moist heat, cryotherapy, patient/family education, and DME and/or AE instructions  RECOMMENDED OTHER SERVICES: Currently receiving PT services at this location  CONSULTED AND AGREED WITH PLAN OF CARE: Patient  PLAN FOR NEXT SESSION: Higher level FM skills (  rotation, translation); GMC w/ timed element for speed/accuracy   Rosie Fate, MSOT, OTR/L 05/20/2022, 1:22 PM

## 2022-05-21 ENCOUNTER — Encounter: Payer: Self-pay | Admitting: Occupational Therapy

## 2022-05-21 ENCOUNTER — Ambulatory Visit: Payer: PPO | Admitting: Occupational Therapy

## 2022-05-21 ENCOUNTER — Ambulatory Visit: Payer: PPO

## 2022-05-21 DIAGNOSIS — R278 Other lack of coordination: Secondary | ICD-10-CM

## 2022-05-21 DIAGNOSIS — I69351 Hemiplegia and hemiparesis following cerebral infarction affecting right dominant side: Secondary | ICD-10-CM

## 2022-05-21 DIAGNOSIS — I639 Cerebral infarction, unspecified: Secondary | ICD-10-CM | POA: Diagnosis not present

## 2022-05-21 DIAGNOSIS — R2689 Other abnormalities of gait and mobility: Secondary | ICD-10-CM

## 2022-05-21 NOTE — Therapy (Signed)
OUTPATIENT OCCUPATIONAL THERAPY TREATMENT NOTE  Patient Name: Ivan Lawson MRN: [id number] DOB:07/01/1956, 66 y.o., male 39 Date: 05/21/2022  PCP: Blair Heys, MD REFERRING PROVIDER: Charlton Amor, PA-C    OT End of Session - 05/21/22 1317     Visit Number 3    Number of Visits 17    Date for OT Re-Evaluation 07/26/22    Authorization Type [employer]    Authorization Time Period VL: [place]    Progress Note Due on Visit 10    OT Start Time 1316    OT Stop Time 1400    OT Time Calculation (min) 44 min    Activity Tolerance Patient tolerated treatment well    Behavior During Therapy WFL for tasks assessed/performed            Past Medical History:  Diagnosis Date   Hyperthyroidism    Past Surgical History:  Procedure Laterality Date   BACK SURGERY  1990   FINGER SURGERY     Patient Active Problem List   Diagnosis Date Noted   Left pontine cerebrovascular accident (HCC) 04/16/2022   Basilar artery stenosis 04/13/2022   Stroke-like symptom 04/12/2022   Cerebral thrombosis with cerebral infarction 04/12/2022   Acute ischemic stroke (HCC) 04/06/2022   Sinus bradycardia 04/06/2022   Erythrocytosis 04/06/2022   Acquired hypothyroidism 04/06/2022   B12 deficiency 01/07/2018   Hypothyroidism following radioiodine therapy 09/25/2016   Elevated PSA 05/03/2016   Numbness 03/22/2016   HTN (hypertension) 11/22/2015   Bilateral hearing loss 11/22/2015   Tinnitus 11/22/2015    ONSET DATE: 04/12/22  REFERRING DIAG: I63.9 (ICD-10-CM) - Cerebral infarction, unspecified   THERAPY DIAG: Hemiplegia and hemiparesis following cerebral infarction affecting right dominant side (HCC)  Other lack of coordination  Rationale for Evaluation and Treatment Rehabilitation  SUBJECTIVE:   SUBJECTIVE STATEMENT: Pt reports continuing to participate in functional activities at home, focusing on using his R hand as much as he is able Pt accompanied by:  self  PAIN: Are you having pain? 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; Midwife  PATIENT GOALS: Get dexterity back in R hand; return to fishing, throwing a baseball  OBJECTIVE:   TODAY'S TREATMENT - 05/21/22: Coordination activities w/ RUE or BUEs as appropriate, including: rotating small ball w/ fingertips, rotating cards in-hand/turning cards end-over end, and picking up 5-10 coins 1 at a time and translating palm to fingertips to place in a stack Trail-making activity, using R index finger to erase letter targets w/out crossing over border completed on window to address [facility] and coordination; OT increased challenge w/ pt completing activity in standing and by incorporating cognitive element for dual tasking w/ pt naming animals corresponding to each letter Picking up 1" blocks w/ R hand and placing in a stack corresponding to the color called out using [employer] app; completed 15 blocks in each stack at 40 bpm, decreasing to 20 bpm after first 8-10 blocks due to decreased steadiness of tower. Activity targeted GMC/coordination, alternating attention, and visual perception; completed x2   PATIENT EDUCATION: Ongoing condition-specific education Person educated: Patient Education method: Explanation Education comprehension: verbalized understanding   HOME EXERCISE PROGRAM: Coordination activities (printed handout)   GOALS: Goals reviewed with patient?  Yes  SHORT TERM GOALS: Target date: 06/16/22  STG  Status:  1 Pt will increase GMC of R, dominant UE, as evidenced by increasing Box and Blocks score by at least  4 Baseline: Right 36 blocks, Left 51 blocks Progressing  2 Pt will demonstrate independence w/ initial HEP designed for gross and fine motor control/coordination of RUE Progressing  3 Pt will improve grip strength of R hand to at least 60 lbs for improved functional use of R, dominant UE Baseline: Right: 53 lbs; Left: 100 lbs Progressing     LONG TERM GOALS: Target date: 07/26/22  LTG  Status:  1 Pt will improve score of UEFI to at least 64/80 to indicate improved functional use of RUE during all ADLs Baseline: 58/80 Progressing  2 Pt will improve grip strength of R hand to at least 75 lbs for improved functional use of R, dominant UE by d/c Baseline: Right: 53 lbs; Left: 100 lbs Progressing  3 Pt will increase GMC of R, dominant UE, as evidenced by increasing Box and Blocks score to within 4 blocks compared to L side Baseline: Right 36 blocks, Left 51 blocks Progressing  4 Pt to improve participation in functional FM tasks w/ R, dominant hand as evidenced by decreasing time to complete 9-HPT by at least 4 sec Baseline: Right: 38.4 sec; Left: 27.1 sec Progressing  5 Pt will demonstrate improved RUE GMC and coordination as evidenced by increasing accuracy w/ finger-to-nose test within 20 sec Baseline: to be assessed Progressing     ASSESSMENT:  CLINICAL IMPRESSION: Pt continues to progress well toward goals w/ OT increasing challenge of GM and FM tasks this session by incorporating timed/speed component as well as incorporating cognitive element into trail making task. Pt able to complete tasks w/out significant difficulty and will benefit from continuing to address higher level FM skills and control.  PERFORMANCE DEFICITS in functional skills including ADLs, IADLs, coordination, dexterity, sensation, strength, FMC, GMC, mobility, balance, decreased knowledge of precautions, decreased knowledge of use of DME, and UE functional use, and cognitive skills including safety  awareness.  IMPAIRMENTS are limiting patient from IADLs, work, and leisure.   COMORBIDITIES may have co-morbidities  that affects occupational performance. Patient will benefit from skilled OT to address above impairments and improve overall function.   PLAN: OT FREQUENCY: 2x/week  OT DURATION: 8 weeks  PLANNED INTERVENTIONS: self care/ADL training, therapeutic exercise, therapeutic activity, neuromuscular re-education, manual therapy, balance training, functional mobility training, electrical stimulation, moist heat, cryotherapy, patient/family education, and DME and/or AE instructions  RECOMMENDED OTHER SERVICES: Currently receiving PT services at this location  CONSULTED AND AGREED WITH PLAN OF CARE: Patient  PLAN FOR NEXT SESSION: Reassess strength and/or Box and Blocks; higher level FM skills (rotation, translation); GMC w/ timed element for speed/accuracy   Rosie Fate, MSOT, OTR/L 05/21/2022, 2:16 PM

## 2022-05-21 NOTE — Therapy (Signed)
OUTPATIENT PHYSICAL THERAPY NEURO TREATMENT   Patient Name: Ivan Lawson MRN: [id number] DOB:[birth year], 66 y.o., male Today's Date: 05/21/2022   PCP: Blair Heys REFERRING PROVIDER: Mariam Dollar   PT End of Session - 05/21/22 1242     Visit Number 5    Date for PT Re-Evaluation 07/02/22    Progress Note Due on Visit 10    PT Start Time 1232    PT Stop Time 1313    PT Time Calculation (min) 41 min    Equipment Utilized During Treatment Gait belt    Activity Tolerance Patient tolerated treatment well    Behavior During Therapy WFL for tasks assessed/performed                 Past Medical History:  Diagnosis Date   Hyperthyroidism    Past Surgical History:  Procedure Laterality Date   BACK SURGERY  1990   FINGER SURGERY     Patient Active Problem List   Diagnosis Date Noted   Left pontine cerebrovascular accident (HCC) 04/16/2022   Basilar artery stenosis 04/13/2022   Stroke-like symptom 04/12/2022   Cerebral thrombosis with cerebral infarction 04/12/2022   Acute ischemic stroke (HCC) 04/06/2022   Sinus bradycardia 04/06/2022   Erythrocytosis 04/06/2022   Acquired hypothyroidism 04/06/2022   B12 deficiency 01/07/2018   Hypothyroidism following radioiodine therapy 09/25/2016   Elevated PSA 05/03/2016   Numbness 03/22/2016   HTN (hypertension) 11/22/2015   Bilateral hearing loss 11/22/2015   Tinnitus 11/22/2015    ONSET DATE: 04/06/22  REFERRING DIAG: I63.9  THERAPY DIAG:  Hemiplegia and hemiparesis following cerebral infarction affecting right dominant side (HCC)  Left pontine cerebrovascular accident (HCC)  Other lack of coordination  Other abnormalities of gait and mobility  Rationale for Evaluation and Treatment Rehabilitation  SUBJECTIVE:                                                                                                                                                                                               SUBJECTIVE STATEMENT: Patient says he is feeling alright and was a bit winded yesterday after session.    Pt accompanied by: self  PERTINENT HISTORY: Acute L paramedian pons infarct w/ increased extent of involvement compared w/ 04/06/22  PMH includes gout that flared up last Wednesday, HTN, acquired hypothyroidism following radioactive iodine therapy for Graves' disease, and prediabetes  PAIN:  Are you having pain? Yes: NPRS scale: 0/10  PRECAUTIONS: Fall  WEIGHT BEARING RESTRICTIONS No  FALLS: Has patient fallen in last 6 months? No  LIVING ENVIRONMENT: Lives with: lives alone Lives in: House/apartment Stairs: Yes: External:  5 steps; can reach both Has following equipment at home: Dan Humphreys - 4 wheeled and Wheelchair (manual)  PLOF: Independent, retired  PATIENT GOALS be able to walk naturally, 10,000-12,000 steps a day again  OBJECTIVE:   UPPER EXTREMITY ROM: WFL UPPER EXTREMITY MMT: 4+ flexion R  LOWER EXTREMITY ROM:  WFL  MMT Right Eval Left Eval  Hip flexion 4 4+  Hip extension    Hip abduction    Hip adduction    Hip internal rotation 4 4  Hip external rotation 4 4  Knee flexion 4- 4+  Knee extension 4- 4+  Ankle dorsiflexion 5 5  Ankle plantarflexion 5 5  Ankle inversion    Ankle eversion     (Blank rows = not tested)   GAIT: Gait pattern: decreased arm swing- Right, decreased step length- Right, decreased stance time- Right, decreased ankle dorsiflexion- Right, ataxic, trendelenburg, lateral lean- Left, and decreased trunk rotation Distance walked: 263ft Assistive device utilized: None Level of assistance: Complete Independence Comments: Decreased step length, L shoulder height is elevated, favors LLE, trunk lean to L, trendelenberg , decreased speed  FUNCTIONAL TESTs:  5 times sit to stand: 12.55s Timed up and go (TUG): 11.05s Berg Balance Scale: TBD Functional gait assessment: 13/30 SLS L 2 secs at best R unable to do w/o support 4 stage  balance (30s) EO firm EC firm min sway EO foam  EC foam CGA   TODAY'S TREATMENT:  05/21/22 Bike L5, 8 mins Feet together on foam catch  Half tandem on foam catch Cybex ext  20# 2x10 Cybex flexion 15#, 20# 2x10  Resisted gait 20#    05/20/22 Nustep L5, 6 mins  Tandem walking w/modA Catch w/yellow ball standing on foam 20reps Walking on balance beam forwards/backwards/sideways SLS at best 7 on L, 5 on R Banded walks    05/16/22 Nustep L5, 8 mins Tandem walking modA, 2 LOB  Catching forwards, back, sideways  Side steps on foam, minA Walking on balance beam in parallel bars  Resisted gait 25# forwards/sideways, 15# backwards  05/24/22 Nustep L5,  Half tandem 30s  Tandem  L 15s   R 30s SLS  R 2,2,3  L 3, 4,4  Tandem walking 13steps x2 with modA  Walking backwards w/minA 2x30ft Side steps and catch  Step ups 6" no support, minA  Resisted gait forwards and sideways 20# minA   PATIENT EDUCATION: Education details: POC, fall risk Person educated: Patient Education method: Explanation and Verbal cues Education comprehension: verbalized understanding and returned demonstration   HOME EXERCISE PROGRAM: TBD   GOALS: Goals reviewed with patient? Yes  SHORT TERM GOALS: Target date: 06/05/22  Patient will be independent with initial HEP. Goal status: INITIAL  2.  Patient will be educated on strategies to decrease risk of falls.  Goal status: INITIAL   LONG TERM GOALS: Target date: 07/02/22  Patient will be independent with advanced/ongoing HEP to improve outcomes and carryover.  Goal status: INITIAL  2.  Patient will demonstrate at least 20/30 on FGA to improve gait stability and reduce risk for falls. Baseline: 13/30 Goal status: INITIAL  3.  Patient will demonstrate decreased fall risk by scoring < 12 sec on 5xSTS. Goal status: INITIAL  4. Patient will demonstrate SLS >10 secs to decrease fall risk and improve balance.    Goal status:  INITIAL   ASSESSMENT:  CLINICAL IMPRESSION: Focused on LE strengthening and balance training today. Patient demonstrates difficulty with half tandem of foam hit beach ball and  needs additional breaks. He is able to do resisted gait with more control and continues to progress each visit with balance.    OBJECTIVE IMPAIRMENTS Abnormal gait, decreased balance, decreased coordination, and decreased strength.   ACTIVITY LIMITATIONS transfers and locomotion level  PARTICIPATION LIMITATIONS: driving, community activity, and yard work  PERSONAL FACTORS 1-2 comorbidities: HTN, hx of strokes  are also affecting patient's functional outcome.   REHAB POTENTIAL: Good  CLINICAL DECISION MAKING: Stable/uncomplicated  EVALUATION COMPLEXITY: Low  PLAN: PT FREQUENCY: 2x/week  PT DURATION: 8 weeks  PLANNED INTERVENTIONS: Therapeutic exercises, Therapeutic activity, Neuromuscular re-education, Balance training, Gait training, Patient/Family education, Joint mobilization, Stair training, Cryotherapy, Moist heat, Biofeedback, and Re-evaluation  PLAN FOR NEXT SESSION: BERG balance, static and dynamic balance activities, cognitive dual tasking   [place], PT 05/21/2022, 1:14 PM

## 2022-05-23 ENCOUNTER — Ambulatory Visit: Payer: PPO

## 2022-05-23 ENCOUNTER — Ambulatory Visit: Payer: PPO | Admitting: Occupational Therapy

## 2022-05-24 ENCOUNTER — Other Ambulatory Visit: Payer: Self-pay | Admitting: *Deleted

## 2022-05-24 ENCOUNTER — Encounter (HOSPITAL_COMMUNITY): Payer: HMO

## 2022-05-24 DIAGNOSIS — I6522 Occlusion and stenosis of left carotid artery: Secondary | ICD-10-CM

## 2022-05-29 ENCOUNTER — Ambulatory Visit: Payer: PPO

## 2022-05-29 DIAGNOSIS — I639 Cerebral infarction, unspecified: Secondary | ICD-10-CM

## 2022-05-29 DIAGNOSIS — R278 Other lack of coordination: Secondary | ICD-10-CM

## 2022-05-29 DIAGNOSIS — I69351 Hemiplegia and hemiparesis following cerebral infarction affecting right dominant side: Secondary | ICD-10-CM

## 2022-05-29 DIAGNOSIS — R2689 Other abnormalities of gait and mobility: Secondary | ICD-10-CM

## 2022-05-29 NOTE — Therapy (Signed)
OUTPATIENT PHYSICAL THERAPY NEURO TREATMENT   Patient Name: Ivan Lawson MRN: [id number] DOB:[birth year], 66 y.o., male Today's Date: 05/29/2022   PCP: Blair Heys REFERRING PROVIDER: Mariam Dollar   PT End of Session - 05/29/22 1702     Visit Number 6    Date for PT Re-Evaluation 07/02/22    Progress Note Due on Visit 10    PT Start Time 1702    PT Stop Time 1741    PT Time Calculation (min) 39 min    Equipment Utilized During Treatment Gait belt    Activity Tolerance Patient tolerated treatment well    Behavior During Therapy WFL for tasks assessed/performed                  Past Medical History:  Diagnosis Date   Hyperthyroidism    Past Surgical History:  Procedure Laterality Date   BACK SURGERY  1990   FINGER SURGERY     Patient Active Problem List   Diagnosis Date Noted   Left pontine cerebrovascular accident (HCC) 04/16/2022   Basilar artery stenosis 04/13/2022   Stroke-like symptom 04/12/2022   Cerebral thrombosis with cerebral infarction 04/12/2022   Acute ischemic stroke (HCC) 04/06/2022   Sinus bradycardia 04/06/2022   Erythrocytosis 04/06/2022   Acquired hypothyroidism 04/06/2022   B12 deficiency 01/07/2018   Hypothyroidism following radioiodine therapy 09/25/2016   Elevated PSA 05/03/2016   Numbness 03/22/2016   HTN (hypertension) 11/22/2015   Bilateral hearing loss 11/22/2015   Tinnitus 11/22/2015    ONSET DATE: 04/06/22  REFERRING DIAG: I63.9  THERAPY DIAG:  Hemiplegia and hemiparesis following cerebral infarction affecting right dominant side (HCC)  Other abnormalities of gait and mobility  Other lack of coordination  Left pontine cerebrovascular accident [facility])  Rationale for Evaluation and Treatment Rehabilitation  SUBJECTIVE:                                                                                                                                                                                               SUBJECTIVE STATEMENT: Patient says he has been working all day in the garden and from mowing long so he feels weary.   Pt accompanied by: self  PERTINENT HISTORY: Acute L paramedian pons infarct w/ increased extent of involvement compared w/ 04/06/22  PMH includes gout that flared up last Wednesday, HTN, acquired hypothyroidism following radioactive iodine therapy for Graves' disease, and prediabetes  PAIN:  Are you having pain? Yes: NPRS scale: 0/10  PRECAUTIONS: Fall  WEIGHT BEARING RESTRICTIONS No  FALLS: Has patient fallen in last 6 months? No  LIVING ENVIRONMENT: Lives with: lives alone Lives  in: House/apartment Stairs: Yes: External: 5 steps; can reach both Has following equipment at home: Walker - 4 wheeled and Wheelchair (manual)  PLOF: Independent, retired  PATIENT GOALS be able to walk naturally, 10,000-12,000 steps a day again  OBJECTIVE:   UPPER EXTREMITY ROM: WFL UPPER EXTREMITY MMT: 4+ flexion R  LOWER EXTREMITY ROM:  WFL  MMT Right Eval Left Eval  Hip flexion 4 4+  Hip extension    Hip abduction    Hip adduction    Hip internal rotation 4 4  Hip external rotation 4 4  Knee flexion 4- 4+  Knee extension 4- 4+  Ankle dorsiflexion 5 5  Ankle plantarflexion 5 5  Ankle inversion    Ankle eversion     (Blank rows = not tested)   GAIT: Gait pattern: decreased arm swing- Right, decreased step length- Right, decreased stance time- Right, decreased ankle dorsiflexion- Right, ataxic, trendelenburg, lateral lean- Left, and decreased trunk rotation Distance walked: 254ft Assistive device utilized: None Level of assistance: Complete Independence Comments: Decreased step length, L shoulder height is elevated, favors LLE, trunk lean to L, trendelenberg , decreased speed  FUNCTIONAL TESTs:  5 times sit to stand: 12.55s Timed up and go (TUG): 11.05s Functional gait assessment: 13/30 SLS L 2 secs at best R unable to do w/o support  4 stage balance  (30s) EO firm EC firm min sway EO foam  EC foam CGA   TODAY'S TREATMENT:  05/29/22 Nustep L5 x83mins Tandem walking  SLS  L at best 20s no hands  R at best 9s Walking hitting ball with direction changes  Step ups on 6" with airex on top, CGA Leg press 60# x10BLE, x10 eccentric RLE, 40#x10 RLE only Ladder drill   05/21/22 Bike L5, 8 mins Feet together on foam catch  Half tandem on foam catch Cybex ext  20# 2x10 Cybex flexion 15#, 20# 2x10  Resisted gait 20#    05/20/22 Nustep L5, 6 mins  Tandem walking w/modA Catch w/yellow ball standing on foam 20reps Walking on balance beam forwards/backwards/sideways SLS at best 7 on L, 5 on R Banded walks    05/16/22 Nustep L5, 8 mins Tandem walking modA, 2 LOB  Catching forwards, back, sideways  Side steps on foam, minA Walking on balance beam in parallel bars  Resisted gait 25# forwards/sideways, 15# backwards  05/24/22 Nustep L5,  Half tandem 30s  Tandem  L 15s   R 30s SLS  R 2,2,3  L 3, 4,4  Tandem walking 13steps x2 with modA  Walking backwards w/minA 2x66ft Side steps and catch  Step ups 6" no support, minA  Resisted gait forwards and sideways 20# minA   PATIENT EDUCATION: Education details: POC, fall risk Person educated: Patient Education method: Explanation and Verbal cues Education comprehension: verbalized understanding and returned demonstration   HOME EXERCISE PROGRAM: TBD   GOALS: Goals reviewed with patient? Yes  SHORT TERM GOALS: Target date: 06/05/22  Patient will be independent with initial HEP. Goal status: INITIAL  2.  Patient will be educated on strategies to decrease risk of falls.  Goal status: INITIAL   LONG TERM GOALS: Target date: 07/02/22  Patient will be independent with advanced/ongoing HEP to improve outcomes and carryover.  Goal status: INITIAL  2.  Patient will demonstrate at least 20/30 on FGA to improve gait stability and reduce risk for falls. Baseline:  13/30 Goal status: INITIAL  3.  Patient will demonstrate decreased fall risk by scoring < 12 sec  on 5xSTS. Goal status: INITIAL  4. Patient will demonstrate SLS >10 secs to decrease fall risk and improve balance.    Goal status: INITIAL   ASSESSMENT:  CLINICAL IMPRESSION:  Patient is showing great improvements with SLS, able to hold it for longer than he has since starting. He has has gotten better with tandem walking and able to take more steps with less assistance. He is able to complete high level gait and multitasking without LOB moving forwards and backwards on command. I am very pleased with his progress. We ended with LE strengthening on the leg press and targeted mostly his weaker side (R).  OBJECTIVE IMPAIRMENTS Abnormal gait, decreased balance, decreased coordination, and decreased strength.   ACTIVITY LIMITATIONS transfers and locomotion level  PARTICIPATION LIMITATIONS: driving, community activity, and yard work  PERSONAL FACTORS 1-2 comorbidities: HTN, hx of strokes  are also affecting patient's functional outcome.   REHAB POTENTIAL: Good  CLINICAL DECISION MAKING: Stable/uncomplicated  EVALUATION COMPLEXITY: Low  PLAN: PT FREQUENCY: 2x/week  PT DURATION: 8 weeks  PLANNED INTERVENTIONS: Therapeutic exercises, Therapeutic activity, Neuromuscular re-education, Balance training, Gait training, Patient/Family education, Joint mobilization, Stair training, Cryotherapy, Moist heat, Biofeedback, and Re-evaluation  PLAN FOR NEXT SESSION: static and dynamic balance activities, cognitive dual tasking   [place], PT 05/29/2022, 5:42 PM

## 2022-05-31 ENCOUNTER — Ambulatory Visit: Payer: PPO | Admitting: Occupational Therapy

## 2022-05-31 ENCOUNTER — Encounter: Payer: Self-pay | Admitting: Occupational Therapy

## 2022-05-31 ENCOUNTER — Ambulatory Visit: Payer: PPO

## 2022-05-31 DIAGNOSIS — I639 Cerebral infarction, unspecified: Secondary | ICD-10-CM | POA: Diagnosis not present

## 2022-05-31 DIAGNOSIS — I69351 Hemiplegia and hemiparesis following cerebral infarction affecting right dominant side: Secondary | ICD-10-CM

## 2022-05-31 DIAGNOSIS — R278 Other lack of coordination: Secondary | ICD-10-CM

## 2022-05-31 DIAGNOSIS — R2689 Other abnormalities of gait and mobility: Secondary | ICD-10-CM

## 2022-05-31 NOTE — Therapy (Signed)
OUTPATIENT PHYSICAL THERAPY NEURO TREATMENT   Patient Name: Ivan Lawson MRN: [id number] DOB:1956/11/12, 66 y.o., male Today's Date: 05/31/2022   PCP: Blair Heys REFERRING PROVIDER: Mariam Dollar   PT End of Session - 05/31/22 0928     Visit Number 7    Date for PT Re-Evaluation 07/02/22    Progress Note Due on Visit 10    PT Start Time 0929    PT Stop Time 1015    PT Time Calculation (min) 46 min    Equipment Utilized During Treatment Gait belt    Activity Tolerance Patient tolerated treatment well    Behavior During Therapy [facility] for tasks assessed/performed                   Past Medical History:  Diagnosis Date   Hyperthyroidism    Past Surgical History:  Procedure Laterality Date   BACK SURGERY  1990   FINGER SURGERY     Patient Active Problem List   Diagnosis Date Noted   Left pontine cerebrovascular accident (HCC) 04/16/2022   Basilar artery stenosis 04/13/2022   Stroke-like symptom 04/12/2022   Cerebral thrombosis with cerebral infarction 04/12/2022   Acute ischemic stroke (HCC) 04/06/2022   Sinus bradycardia 04/06/2022   Erythrocytosis 04/06/2022   Acquired hypothyroidism 04/06/2022   B12 deficiency 01/07/2018   Hypothyroidism following radioiodine therapy 09/25/2016   Elevated PSA 05/03/2016   Numbness 03/22/2016   HTN (hypertension) 11/22/2015   Bilateral hearing loss 11/22/2015   Tinnitus 11/22/2015    ONSET DATE: 04/06/22  REFERRING DIAG: I63.9  THERAPY DIAG:  Hemiplegia and hemiparesis following cerebral infarction affecting right dominant side (HCC)  Left pontine cerebrovascular accident (HCC)  Other lack of coordination  Other abnormalities of gait and mobility  Rationale for Evaluation and Treatment Rehabilitation  SUBJECTIVE:                                                                                                                                                                                               SUBJECTIVE STATEMENT: Patient states his low back and hip are sore today from working out in the yard yesterday but it will get better as the day goes on.   Pt accompanied by: self  PERTINENT HISTORY: Acute L paramedian pons infarct w/ increased extent of involvement compared w/ 04/06/22  PMH includes gout that flared up last Wednesday, HTN, acquired hypothyroidism following radioactive iodine therapy for Graves' disease, and prediabetes  PAIN:  Are you having pain? Yes: NPRS scale: 0/10  PRECAUTIONS: Fall  WEIGHT BEARING RESTRICTIONS No  FALLS: Has patient fallen in last 6 months?  No  LIVING ENVIRONMENT: Lives with: lives alone Lives in: House/apartment Stairs: Yes: External: 5 steps; can reach both Has following equipment at home: Environmental consultant - 4 wheeled and Wheelchair (manual)  PLOF: Independent, retired  PATIENT GOALS be able to walk naturally, 10,000-12,000 steps a day again  OBJECTIVE:   UPPER EXTREMITY ROM: WFL UPPER EXTREMITY MMT: 4+ flexion R  LOWER EXTREMITY ROM:  WFL  MMT Right Eval Left Eval  Hip flexion 4 4+  Hip extension    Hip abduction    Hip adduction    Hip internal rotation 4 4  Hip external rotation 4 4  Knee flexion 4- 4+  Knee extension 4- 4+  Ankle dorsiflexion 5 5  Ankle plantarflexion 5 5  Ankle inversion    Ankle eversion     (Blank rows = not tested)   GAIT: Gait pattern: decreased arm swing- Right, decreased step length- Right, decreased stance time- Right, decreased ankle dorsiflexion- Right, ataxic, trendelenburg, lateral lean- Left, and decreased trunk rotation Distance walked: 233ft Assistive device utilized: None Level of assistance: Complete Independence Comments: Decreased step length, L shoulder height is elevated, favors LLE, trunk lean to L, trendelenberg , decreased speed  FUNCTIONAL TESTs:  5 times sit to stand: 12.55s Timed up and go (TUG): 11.05s Functional gait assessment: 13/30 SLS L 2 secs at best R unable to  do w/o support  4 stage balance (30s) EO firm EC firm min sway EO foam  EC foam CGA   TODAY'S TREATMENT:  05/31/22 Nustep L5 x65mins Tandem on Airex   L 14 at best  R 12 at best    Side step onto airex 6" CGA Resisted gait w/heel tap 6" and side steps 20# Abd pushes on treadmill RLE only x10 Leg press 80# 2x10 x10 eccentric RLE, 40# RLE only  Cybex ext 35# BLEx10, RLE eccentric x10, RLE only x10 Cybex flex 25# BLE2x10, 15# RLE only x10   05/29/22 Nustep L5 x88mins Tandem walking  SLS  L at best 20s no hands  R at best 9s Walking hitting ball with direction changes  Step ups on 6" with airex on top, CGA Leg press 60# x10BLE, x10 eccentric RLE, 40#x10 RLE only   05/21/22 Bike L5, 8 mins Feet together on foam catch  Half tandem on foam catch Cybex ext  20# 2x10 Cybex flexion 15#, 20# 2x10  Resisted gait 20#    05/20/22 Nustep L5, 6 mins  Tandem walking w/modA Catch w/yellow ball standing on foam 20reps Walking on balance beam forwards/backwards/sideways SLS at best 7 on L, 5 on R Banded walks    05/16/22 Nustep L5, 8 mins Tandem walking modA, 2 LOB  Catching forwards, back, sideways  Side steps on foam, minA Walking on balance beam in parallel bars  Resisted gait 25# forwards/sideways, 15# backwards  05/24/22 Nustep L5,  Half tandem 30s  Tandem  L 15s   R 30s SLS  R 2,2,3  L 3, 4,4  Tandem walking 13steps x2 with modA  Walking backwards w/minA 2x28ft Side steps and catch  Step ups 6" no support, minA  Resisted gait forwards and sideways 20# minA   PATIENT EDUCATION: Education details: POC, fall risk Person educated: Patient Education method: Explanation and Verbal cues Education comprehension: verbalized understanding and returned demonstration   HOME EXERCISE PROGRAM: TBD   GOALS: Goals reviewed with patient? Yes  SHORT TERM GOALS: Target date: 06/05/22  Patient will be independent with initial HEP. Goal status: INITIAL  2.  Patient will be educated on strategies to decrease risk of falls.  Goal status: INITIAL   LONG TERM GOALS: Target date: 07/02/22  Patient will be independent with advanced/ongoing HEP to improve outcomes and carryover.  Goal status: INITIAL  2.  Patient will demonstrate at least 20/30 on FGA to improve gait stability and reduce risk for falls. Baseline: 13/30 Goal status: INITIAL  3.  Patient will demonstrate decreased fall risk by scoring < 12 sec on 5xSTS. Goal status: INITIAL  4. Patient will demonstrate SLS >10 secs to decrease fall risk and improve balance.    Goal status: INITIAL   ASSESSMENT:  CLINICAL IMPRESSION:  Patient continues to show good progress with strength and balance. We continued to practice with his balance as well as coordination and LE strength training targeting the R side. He has difficulty pushing off of R leg with step ups and resisted gait, requires CGA for safety.   OBJECTIVE IMPAIRMENTS Abnormal gait, decreased balance, decreased coordination, and decreased strength.   ACTIVITY LIMITATIONS transfers and locomotion level  PARTICIPATION LIMITATIONS: driving, community activity, and yard work  PERSONAL FACTORS 1-2 comorbidities: HTN, hx of strokes  are also affecting patient's functional outcome.   REHAB POTENTIAL: Good  CLINICAL DECISION MAKING: Stable/uncomplicated  EVALUATION COMPLEXITY: Low  PLAN: PT FREQUENCY: 2x/week  PT DURATION: 8 weeks  PLANNED INTERVENTIONS: Therapeutic exercises, Therapeutic activity, Neuromuscular re-education, Balance training, Gait training, Patient/Family education, Joint mobilization, Stair training, Cryotherapy, Moist heat, Biofeedback, and Re-evaluation  PLAN FOR NEXT SESSION: static and dynamic balance activities, cognitive dual tasking   [place], PT 05/31/2022, 10:16 AM

## 2022-05-31 NOTE — Therapy (Signed)
OUTPATIENT OCCUPATIONAL THERAPY TREATMENT NOTE  Patient Name: Ivan Lawson MRN: [id number] DOB:[birth year], 66 y.o., male 48 Date: 05/31/2022  PCP: Gaynelle Arabian, MD REFERRING PROVIDER: Cathlyn Parsons, PA-C    OT End of Session - 05/31/22 1019     Visit Number 4    Number of Visits 17    Date for OT Re-Evaluation 07/26/22    Authorization Type [employer]    Authorization Time Period VL: [place]    Progress Note Due on Visit 10    OT Start Time 1017    OT Stop Time 1059    OT Time Calculation (min) 42 min    Activity Tolerance Patient tolerated treatment well    Behavior During Therapy WFL for tasks assessed/performed            Past Medical History:  Diagnosis Date   Hyperthyroidism    Past Surgical History:  Procedure Laterality Date   [place]   FINGER SURGERY     Patient Active Problem List   Diagnosis Date Noted   Left pontine cerebrovascular accident ([place]) 04/16/2022   Basilar artery stenosis 04/13/2022   Stroke-like symptom 04/12/2022   Cerebral thrombosis with cerebral infarction 04/12/2022   Acute ischemic stroke ([place]) 04/06/2022   Sinus bradycardia 04/06/2022   Erythrocytosis 04/06/2022   Acquired hypothyroidism 04/06/2022   B12 deficiency 01/07/2018   Hypothyroidism following radioiodine therapy 09/25/2016   Elevated PSA 05/03/2016   Numbness 03/22/2016   HTN (hypertension) 11/22/2015   Bilateral hearing loss 11/22/2015   Tinnitus 11/22/2015    ONSET DATE: 04/12/22  REFERRING DIAG: I63.9 (ICD-10-CM) - Cerebral infarction, unspecified   THERAPY DIAG: Hemiplegia and hemiparesis following cerebral infarction affecting right dominant side (HCC)  Other lack of coordination  Other abnormalities of gait and mobility  Rationale for Evaluation and Treatment Rehabilitation  SUBJECTIVE:   SUBJECTIVE STATEMENT: "One of my goals was to cast a fishing line; I was able to do that over the weekend" Pt accompanied by:  self  PAIN: Are you having pain? 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; Training and development officer  PATIENT GOALS: Get dexterity back in R hand; return to fishing, throwing a baseball  OBJECTIVE:   TODAY'S TREATMENT - 05/31/22: R shoulder flexion against anchored resistance (red theraband) completed 2x15 while seated R shoulder ER against anchored resistance (red theraband) completed 2x15 while seated R shoulder row w/ scapular retraction against anchored resistance (red theraband) 2x15 while seated   PATIENT EDUCATION: Ongoing condition-specific education, particularly related to benefit of exercises for functional ADLs/IADLs; updated HEP to include RUE strengthening Person educated: Patient Education method: Explanation, Demonstration, and Handouts Education comprehension: verbalized understanding and returned demonstration   HOME EXERCISE PROGRAM: Coordination activities (printed handout) MedBridge Code: [id number] - Seated Single Arm Shoulder Flexion  - 2 x daily - 2 sets - 15 reps - Seated Shoulder External Rotation with Resistance  - 2 x daily - 2 sets - 15 reps - Seated Shoulder Row with Resistance Anchored at Feet  - 2 x daily - 2 sets - 15 reps - Seated Elbow Flexion with Resistance  - 2 x daily - 2 sets - 15 reps   GOALS: Goals reviewed with patient? Yes  SHORT TERM GOALS: Target date: 06/16/22  STG  Status:  1 Pt will increase GMC of R, dominant UE, as evidenced by increasing Box and [place]ocks score by at least 4 Baseline: Right 36 blocks, Left 51 blocks Met - 05/31/22; 51 w/ RUE  2 Pt will demonstrate independence w/  initial HEP designed for gross and fine motor control/coordination of RUE Progressing  3 Pt will improve grip strength of R hand to at least 60 lbs for improved functional use of R, dominant UE Baseline: Right: 53 lbs; Left: 100 lbs Met - 05/31/22; 65 lbs w/ RUE     LONG TERM GOALS: Target date: 07/26/22  LTG  Status:  1 Pt will improve score of UEFI to at least 64/80 to indicate improved functional use of RUE during all ADLs Baseline: 58/80 Progressing  2 Pt will improve grip strength of R hand to at least 75 lbs for improved functional use of R, dominant UE by d/c Baseline: Right: 53 lbs; Left: 100 lbs Progressing  3 Pt will increase GMC of R, dominant UE, as evidenced by increasing Box and Blocks score to within 4 blocks compared to L side Baseline: Right 36 blocks, Left 51 blocks Met - 05/31/22; 51 w/ RUE  4 Pt to improve participation in functional FM tasks w/ R, dominant hand as evidenced by decreasing time to complete 9-HPT by at least 4 sec Baseline: Right: 38.4 sec; Left: 27.1 sec Progressing - 05/31/22; 33.94 w/ RUE  5 Pt will demonstrate improved RUE GMC and coordination as evidenced by increasing accuracy w/ finger-to-nose test within 20 sec Baseline: to be assessed Progressing     ASSESSMENT:  CLINICAL IMPRESSION: Pt continues to progress well toward goals and reports very minimal difficulty during functional activities at home. OT reassessed [place], [place], and grip strength this session w/ pt demonstrating improvements in all areas though RUE strength and [place] continue to be limited. OT introduced theraband exercises into HEP and pt was able to complete all exercises w/ OT providing min verbal and occasional tactile cues for consistency w/ alignment and decreasing compensatory patterns (e.g., trunk leaning, particularly laterally/posteriorly, shoulder elevation, and trunk rotation). OT will continue to address higher level FM skills and UB strength.  PERFORMANCE DEFICITS in functional  skills including ADLs, IADLs, coordination, dexterity, sensation, strength, FMC, GMC, mobility, balance, decreased knowledge of precautions, decreased knowledge of use of DME, and UE functional use, and cognitive skills including safety awareness.  IMPAIRMENTS are limiting patient from IADLs, work, and leisure.   COMORBIDITIES may have co-morbidities  that affects occupational performance. Patient will benefit from skilled OT to address above impairments and improve overall function.   PLAN: OT FREQUENCY: 2x/week  OT DURATION: 8 weeks  PLANNED INTERVENTIONS: self care/ADL training, therapeutic exercise, therapeutic activity, neuromuscular re-education, manual therapy, balance training, functional mobility training, electrical stimulation, moist heat, cryotherapy, patient/family education, and DME and/or AE instructions  RECOMMENDED OTHER SERVICES: Currently receiving PT services at this location  CONSULTED AND AGREED WITH PLAN OF CARE: Patient  PLAN FOR NEXT SESSION: Continue w/ RUE and grip strength; higher level FM skills (rotation, translation); [place] w/ timed element for speed/accuracy   Kathrine Cords, MSOT, OTR/L 05/31/2022, 11:50 AM

## 2022-06-05 ENCOUNTER — Ambulatory Visit: Payer: PPO | Attending: Family Medicine

## 2022-06-05 DIAGNOSIS — I639 Cerebral infarction, unspecified: Secondary | ICD-10-CM | POA: Insufficient documentation

## 2022-06-05 DIAGNOSIS — R278 Other lack of coordination: Secondary | ICD-10-CM | POA: Insufficient documentation

## 2022-06-05 DIAGNOSIS — I69351 Hemiplegia and hemiparesis following cerebral infarction affecting right dominant side: Secondary | ICD-10-CM | POA: Insufficient documentation

## 2022-06-05 DIAGNOSIS — R2689 Other abnormalities of gait and mobility: Secondary | ICD-10-CM | POA: Insufficient documentation

## 2022-06-05 NOTE — Therapy (Signed)
OUTPATIENT PHYSICAL THERAPY NEURO TREATMENT   Patient Name: Ivan Lawson MRN: [id number] DOB:15-Jun-1956, 66 y.o., male Today's Date: 06/05/2022   PCP: Blair Heys REFERRING PROVIDER: Mariam Dollar   PT End of Session - 06/05/22 1313     Visit Number 8    Date for PT Re-Evaluation 07/02/22    Progress Note Due on Visit 10    PT Start Time 1315    PT Stop Time 1357    PT Time Calculation (min) 42 min    Equipment Utilized During Treatment Gait belt    Activity Tolerance Patient tolerated treatment well    Behavior During Therapy WFL for tasks assessed/performed                    Past Medical History:  Diagnosis Date   Hyperthyroidism    Past Surgical History:  Procedure Laterality Date   BACK SURGERY  1990   FINGER SURGERY     Patient Active Problem List   Diagnosis Date Noted   Left pontine cerebrovascular accident (HCC) 04/16/2022   Basilar artery stenosis 04/13/2022   Stroke-like symptom 04/12/2022   Cerebral thrombosis with cerebral infarction 04/12/2022   Acute ischemic stroke (HCC) 04/06/2022   Sinus bradycardia 04/06/2022   Erythrocytosis 04/06/2022   Acquired hypothyroidism 04/06/2022   B12 deficiency 01/07/2018   Hypothyroidism following radioiodine therapy 09/25/2016   Elevated PSA 05/03/2016   Numbness 03/22/2016   HTN (hypertension) 11/22/2015   Bilateral hearing loss 11/22/2015   Tinnitus 11/22/2015    ONSET DATE: 04/06/22  REFERRING DIAG: I63.9  THERAPY DIAG:  Hemiplegia and hemiparesis following cerebral infarction affecting right dominant side (HCC)  Left pontine cerebrovascular accident (HCC)  Other lack of coordination  Other abnormalities of gait and mobility  Rationale for Evaluation and Treatment Rehabilitation  SUBJECTIVE:                                                                                                                                                                                               SUBJECTIVE STATEMENT: Patient reports he was sore over the weekend but as the week went on her felt better.   Pt accompanied by: self  PERTINENT HISTORY: Acute L paramedian pons infarct w/ increased extent of involvement compared w/ 04/06/22  PMH includes gout that flared up last Wednesday, HTN, acquired hypothyroidism following radioactive iodine therapy for Graves' disease, and prediabetes  PAIN:  Are you having pain? Yes: NPRS scale: 0/10  PRECAUTIONS: Fall  WEIGHT BEARING RESTRICTIONS No  FALLS: Has patient fallen in last 6 months? No  LIVING ENVIRONMENT: Lives with: lives alone Lives  in: House/apartment Stairs: Yes: External: 5 steps; can reach both Has following equipment at home: Walker - 4 wheeled and Wheelchair (manual)  PLOF: Independent, retired  PATIENT GOALS be able to walk naturally, 10,000-12,000 steps a day again  OBJECTIVE:   UPPER EXTREMITY ROM: WFL UPPER EXTREMITY MMT: 4+ flexion R  LOWER EXTREMITY ROM:  WFL  MMT Right Eval Left Eval  Hip flexion 4 4+  Hip extension    Hip abduction    Hip adduction    Hip internal rotation 4 4  Hip external rotation 4 4  Knee flexion 4- 4+  Knee extension 4- 4+  Ankle dorsiflexion 5 5  Ankle plantarflexion 5 5  Ankle inversion    Ankle eversion     (Blank rows = not tested)   GAIT: Gait pattern: decreased arm swing- Right, decreased step length- Right, decreased stance time- Right, decreased ankle dorsiflexion- Right, ataxic, trendelenburg, lateral lean- Left, and decreased trunk rotation Distance walked: 241ft Assistive device utilized: None Level of assistance: Complete Independence Comments: Decreased step length, L shoulder height is elevated, favors LLE, trunk lean to L, trendelenberg , decreased speed  FUNCTIONAL TESTs:  5 times sit to stand: 12.55s Timed up and go (TUG): 11.05s Functional gait assessment: 13/30 SLS L 2 secs at best R unable to do w/o support  4 stage balance (30s) EO  firm EC firm min sway EO foam  EC foam CGA   TODAY'S TREATMENT:  06/05/22 Bike L5 x48mins Tandem walking on beam Side steps on beam  Tandem walking     Feet together catching yellow ball on airex    Forward lunges on BOSU x10 BLE   Farmers carry 20#    Lateral band walks with catch    Mini squats on BOSU [facility]    05/31/22 Nustep L5 x26mins Tandem on Airex   L 14 at best  R 12 at best    Side step onto airex 6" CGA Resisted gait w/heel tap 6" and side steps 20# Abd pushes on treadmill RLE only x10 Leg press 80# 2x10 x10 eccentric RLE, 40# RLE only  Cybex ext 35# BLEx10, RLE eccentric x10, RLE only x10 Cybex flex 25# BLE2x10, 15# RLE only x10   05/29/22 Nustep L5 x3mins Tandem walking  SLS  L at best 20s no hands  R at best 9s Walking hitting ball with direction changes  Step ups on 6" with airex on top, CGA Leg press 60# x10BLE, x10 eccentric RLE, 40#x10 RLE only   05/21/22 Bike L5, 8 mins Feet together on foam catch  Half tandem on foam catch Cybex ext  20# 2x10 Cybex flexion 15#, 20# 2x10  Resisted gait 20#    05/20/22 Nustep L5, 6 mins  Tandem walking w/modA Catch w/yellow ball standing on foam 20reps Walking on balance beam forwards/backwards/sideways SLS at best 7 on L, 5 on R Banded walks    05/16/22 Nustep L5, 8 mins Tandem walking modA, 2 LOB  Catching forwards, back, sideways  Side steps on foam, minA Walking on balance beam in parallel bars  Resisted gait 25# forwards/sideways, 15# backwards  05/24/22 Nustep L5,  Half tandem 30s  Tandem  L 15s   R 30s SLS  R 2,2,3  L 3, 4,4  Tandem walking 13steps x2 with modA  Walking backwards w/minA 2x25ft Side steps and catch  Step ups 6" no support, minA  Resisted gait forwards and sideways 20# minA   PATIENT EDUCATION: Education details: POC, fall risk  Person educated: Patient Education method: Explanation and Verbal cues Education comprehension: verbalized understanding and returned  demonstration   HOME EXERCISE PROGRAM: TBD   GOALS: Goals reviewed with patient? Yes  SHORT TERM GOALS: Target date: 06/05/22  Patient will be independent with initial HEP. Goal status: INITIAL  2.  Patient will be educated on strategies to decrease risk of falls.  Goal status: INITIAL   LONG TERM GOALS: Target date: 07/02/22  Patient will be independent with advanced/ongoing HEP to improve outcomes and carryover.  Goal status: INITIAL  2.  Patient will demonstrate at least 20/30 on FGA to improve gait stability and reduce risk for falls. Baseline: 13/30 Goal status: INITIAL  3.  Patient will demonstrate decreased fall risk by scoring < 12 sec on 5xSTS. Goal status: INITIAL  4. Patient will demonstrate SLS >10 secs to decrease fall risk and improve balance.    Goal status: INITIAL   ASSESSMENT:  CLINICAL IMPRESSION: Patient continues to show good progress with balance and strength. He can do side steps on beam without needing support. Tandem walking 11 steps, which is the best he has done. Increased difficulty with forward lunges on BOSU, needs minA due to LOB. Able to do mini squats on BOSU with 2HHA. Progress each visit.    OBJECTIVE IMPAIRMENTS Abnormal gait, decreased balance, decreased coordination, and decreased strength.   ACTIVITY LIMITATIONS transfers and locomotion level  PARTICIPATION LIMITATIONS: driving, community activity, and yard work  PERSONAL FACTORS 1-2 comorbidities: HTN, hx of strokes  are also affecting patient's functional outcome.   REHAB POTENTIAL: Good  CLINICAL DECISION MAKING: Stable/uncomplicated  EVALUATION COMPLEXITY: Low  PLAN: PT FREQUENCY: 2x/week  PT DURATION: 8 weeks  PLANNED INTERVENTIONS: Therapeutic exercises, Therapeutic activity, Neuromuscular re-education, Balance training, Gait training, Patient/Family education, Joint mobilization, Stair training, Cryotherapy, Moist heat, Biofeedback, and Re-evaluation  PLAN FOR  NEXT SESSION: static and dynamic balance activities, cognitive dual tasking   [place], PT 06/05/2022, 1:59 PM

## 2022-06-05 NOTE — Progress Notes (Signed)
Cardiology Office Note:    Date:  06/06/2022   ID:  Ivan Lawson, DOB 26-Mar-1956, MRN [id number]  PCP:  Ivan Heys, MD  Cardiologist:  Ivan Red, MD  Referring MD: Ivan Heys, MD   CC: new patient consultation for history of stroke  History of Present Illness:    Ivan Lawson is a 66 y.o. male with a hx of hypertension, Grave's disease, hypothyroidism, hyperthyroidism, CKD stage IIIa, and prediabetes, who is seen as a new consult at the request of Ivan Heys, MD for evaluation and management with discussion of monitor results following stroke.  Initially Ivan Lawson was admitted to the hospital 04/06/22 after presenting with new onset numbness to the fingers and toes of his right side. His work-up was suggestive of acute stroke and possible AKI. He was discharged on aspirin, Plavix, and statin combination, with Plavix to be stopped after 21 days. He was given a 30-day event monitor with cardiology follow-up. Due to resting bradycardia and renal insufficiency, his bisoprolol and HCTZ combination was discontinued. He was started on Norvasc with hydralazine combination.  He was re-admitted 04/12/2022 after presenting with concerns for right sided weakness and worsening dysarthria. An MRI confirmed evolution of his previously diagnosed stroke (left paramedian pontine infarct). CTA head and neck was significant for stenosis of mid basilar, left proximal PCA and right A1 origin. He was discharged on Brilinta/Aspirin for 30 days, followed by Plavix/Aspirin for 60 days, followed by Aspirin indefinitely. Also discharged on amlodipine 2.5 mg daily; hydralazine was discontinued.  Cardiovascular risk factors: Prior clinical ASCVD: 2 strokes in short succession as described above Comorbid conditions: Hypertension - Initially he had noticed his blood pressure was gradually increasing, as noted when he routinely donated blood. Prior to his stroke, his blood pressure was averaging  135/60 at home. Since his stroke, he has noticed his blood pressure is now typically in the 150's-160's at home, confirmed in clinic today (150/80).    Metabolic syndrome/Obesity: BMI 28 Chronic inflammatory conditions: No. Tobacco use history: Never.  Family history: Both of his parents had minor strokes; his father died of a heart attack around 75 yo. He has one brother who also has issues with nephrolithiasis, no known cardiovascular issues. Prior cardiac testing and/or incidental findings on other testing (ie coronary calcium):  He reports he is scheduled for an ultrasound later today.  Today he states he is wonderful considering his situation. He is gradually returning to his baseline.  During his physical therapy he developed a knot with swelling on his right first finger that has almost resolved at this time. Also around that time he developed gout in his right foot, which seems to have resolved in the past few days.  He denies ever being diagnosed with diabetes (previously was prediabetic).  He denies any palpitations, chest pain, or shortness of breath. No lightheadedness, headaches, syncope, orthopnea, or PND.  He reports some loss of hearing for the past 20 years.  Past Medical History:  Diagnosis Date   Hyperthyroidism     Past Surgical History:  Procedure Laterality Date   BACK SURGERY  1990   FINGER SURGERY      Current Medications: Current Outpatient Medications on File Prior to Visit  Medication Sig   acetaminophen (TYLENOL) 325 MG tablet Take 1-2 tablets (325-650 mg total) by mouth every 4 (four) hours as needed for mild pain.   amLODipine (NORVASC) 2.5 MG tablet Take 2.5 mg by mouth daily.   aspirin 81 MG EC tablet  Take 1 tablet (81 mg total) by mouth daily. Swallow whole.   clopidogrel (PLAVIX) 75 MG tablet Take 1 tablet (75 mg total) by mouth daily. Starting on 6/11, take for 60 days (2 months)   diclofenac Sodium (VOLTAREN) 1 % GEL Apply 2 g topically 4 (four)  times daily.   levothyroxine (SYNTHROID) 137 MCG tablet Take 137 mcg by mouth every morning.   methocarbamol (ROBAXIN) 500 MG tablet Take 1 tablet (500 mg total) by mouth every 6 (six) hours as needed for muscle spasms.   rosuvastatin (CRESTOR) 20 MG tablet Take 1 tablet (20 mg total) by mouth daily.   vitamin B-12 (CYANOCOBALAMIN) 1000 MCG tablet Take 1,000 mcg by mouth daily.   No current facility-administered medications on file prior to visit.     Allergies:   Benadryl [diphenhydramine]   Social History   Tobacco Use   Smoking status: Never   Smokeless tobacco: Never  Vaping Use   Vaping Use: Never used  Substance Use Topics   Alcohol use: Not Currently   Drug use: Not Currently    Family History: family history is negative for Thyroid disease.  ROS:   Please see the history of present illness.  Additional pertinent ROS: Constitutional: Negative for chills, fever, night sweats, unintentional weight loss  HENT: Negative for ear pain. Positive for hearing loss (chronic).   Eyes: Negative for loss of vision and eye pain.  Respiratory: Negative for cough, sputum, wheezing.   Cardiovascular: See HPI. Gastrointestinal: Negative for abdominal pain, melena, and hematochezia.  Genitourinary: Negative for dysuria and hematuria.  Musculoskeletal: Negative for falls and myalgias.  Skin: Negative for itching and rash.  Neurological: Negative for focal weakness, focal sensory changes and loss of consciousness.  Endo/Heme/Allergies: Does not bruise/bleed easily.     EKGs/Labs/Other Studies Reviewed:    The following studies were reviewed today:  Monitor 05/2022: 30 days of data recorded on [employer] monitor. Patient had a min HR of 47 bpm, max HR of 181 bpm, and avg HR of 72 bpm. Predominant underlying rhythm was Sinus Rhythm. No VT, SVT, atrial fibrillation, high degree block, or pauses noted. Isolated atrial and ventricular ectopy was rare (<1%). There were 6 triggered events,  which were sinus rhythm/sinus tach. No significant arrhythmias detected.  CTA Head/Neck  04/12/2022: FINDINGS: CTA NECK FINDINGS   Aortic arch: Unremarkable   Right carotid system: Small mild plaque at the bifurcation without stenosis or ulceration   Left carotid system: Predominately calcified plaque at the ICA bulb. No stenosis or ulceration.   Vertebral arteries: No proximal subclavian stenosis. Dominant right vertebral artery. The vertebral arteries are smoothly contoured and diffusely patent   Skeleton: None   Other neck: Negative   Upper chest: Negative   Review of the MIP images confirms the above findings   CTA HEAD FINDINGS   Anterior circulation: Atheromatous calcification along the carotid siphons. No branch occlusion, beading, or aneurysm. High-grade right A1 origin Stenosis.   Posterior circulation: Dominant right vertebral artery. Fetal type bilateral PCA. At least moderate narrowing at the left P2 segment. Moderate mid basilar stenosis. No emergent branch occlusion, beading, or aneurysm.   Venous sinuses: Patent   Anatomic variants: As above   Review of the MIP images confirms the above findings   IMPRESSION: 1. No emergent finding. 2. Intracranial atherosclerosis with most notable stenoses affecting the mid basilar, left proximal PCA, and right A1 origin. 3. No flow limiting stenosis or embolic source seen in the neck.  Echocardiogram  04/07/2022: Sonographer Comments: Technically difficult study due to poor echo  windows, suboptimal apical window and suboptimal subcostal window. Patient  had trouble following directions. Study delayed 20 mins to bathroom  patient.  IMPRESSIONS    1. Left ventricular ejection fraction, by estimation, is 60 to 65%. The  left ventricle has normal function. The left ventricle has no regional  wall motion abnormalities. Left ventricular diastolic parameters were  normal.   2. Right ventricular systolic function is  normal. The right ventricular  size is normal. Tricuspid regurgitation signal is inadequate for assessing  PA pressure.   3. The mitral valve is grossly normal. Trivial mitral valve  regurgitation.   4. The aortic valve is tricuspid. Aortic valve regurgitation is not  visualized.   5. The inferior vena cava is normal in size with greater than 50%  respiratory variability, suggesting right atrial pressure of 3 mmHg.   Comparison(s): No prior Echocardiogram.   EKG:  EKG is personally reviewed.   06/06/2022:  NSR at 83 bpm  Recent Labs: 04/07/2022: TSH 1.411 04/08/2022: Magnesium 1.9 04/17/2022: ALT 23 04/23/2022: Hemoglobin 15.0; Platelets 225 04/24/2022: BUN 20; Creatinine, Ser 1.40; Potassium 4.4; Sodium 138   Recent Lipid Panel    Component Value Date/Time   CHOL 153 04/07/2022 0900   TRIG 72 04/07/2022 0900   HDL 38 (L) 04/07/2022 0900   CHOLHDL 4.0 04/07/2022 0900   VLDL 14 04/07/2022 0900   LDLCALC 101 (H) 04/07/2022 0900    Physical Exam:    VS:  BP (!) 150/80 (BP Location: Left Arm, Patient Position: Sitting, Cuff Size: Normal)   Pulse 83   Ht 6' (1.829 m)   Wt 208 lb 3.2 oz (94.4 kg)   BMI 28.24 kg/m     Wt Readings from Last 3 Encounters:  06/06/22 208 lb 3.2 oz (94.4 kg)  05/13/22 210 lb (95.3 kg)  05/10/22 210 lb (95.3 kg)    GEN: Well nourished, well developed in no acute distress HEENT: Normal, moist mucous membranes NECK: No JVD CARDIAC: regular rhythm, normal S1 and S2, no rubs or gallops. No murmur. VASCULAR: Radial and DP pulses 2+ bilaterally. No carotid bruits RESPIRATORY:  Clear to auscultation without rales, wheezing or rhonchi  ABDOMEN: Soft, non-tender, non-distended MUSCULOSKELETAL:  Ambulates independently SKIN: Warm and dry, no edema NEUROLOGIC:  Alert and oriented x 3. No focal neuro deficits noted. PSYCHIATRIC:  Normal affect    ASSESSMENT:    1. History of CVA (cerebrovascular accident)   2. Essential hypertension   3. Bilateral  carotid artery stenosis   4. Pure hypercholesterolemia   5. Cardiac risk counseling   6. Counseling on health promotion and disease prevention    PLAN:    CVA Carotid stenosis Hypercholesterolemia -reviewed monitor results today, no evidence of atrial fibrillation -echo unremarkable -carotid ultrasounds done after our visit, show 1-39% bilateral carotid stenosis -ECG normal -per neurology, plan is for aspirin and clopidogrel for a total of 2 months, then aspirin alone -on rosuvastatin 20 mg daily, has not yet been on statin for 2 months, has pending recheck in the future. LDL goal at least <70.  Hypertension -elevated today -increased amlodipine from 2.5 mg to 5 mg daily -will need follow up in 4-6 weeks given stroke. Slow titration  Cardiac risk counseling and prevention recommendations: -recommend heart healthy/Mediterranean diet, with whole grains, fruits, vegetable, fish, lean meats, nuts, and olive oil. Limit salt. -recommend moderate walking, 3-5 times/week for 30-50 minutes each session. Aim for at least  150 minutes.week. Goal should be pace of 3 miles/hours, or walking 1.5 miles in 30 minutes -recommend avoidance of tobacco products. Avoid excess alcohol.  Plan for follow up: 4-6 weeks for blood pressure check.  Buford Dresser, MD, PhD, [facility]    Medication Adjustments/Labs and Tests Ordered: Current medicines are reviewed at length with the patient today.  Concerns regarding medicines are outlined above.   Orders Placed This Encounter  Procedures   EKG 12-Lead   Meds ordered this encounter  Medications   amLODipine (NORVASC) 5 MG tablet    Sig: Take 1 tablet (5 mg total) by mouth daily.    Dispense:  90 tablet    Refill:  3   Patient Instructions  Medication Instructions:  Increase amlodipine from 2.5 mg to 5 mg daily (can take 2 of the 2.5 mg pills). Do this for about 2 weeks. If blood pressure still consistently >140 on the  top, increase to 10 mg amlodipine daily (can take 4 of the 2.5 mg tabs). We will have you come back in 4-6 weeks to check BP and see if we need to add additional meds. Goal is ~130/80. We don't want it too low given the stroke, so if you see <110/70 consistently please let us know.  *If you need a refill on your cardiac medications before your next appointment, please call your pharmacy*   Lab Work: None ordered today   Testing/Procedures: None ordered today   Follow-Up: At [facility], you and your health needs are our priority.  As part of our continuing mission to provide you with exceptional heart care, we have created designated Provider Care Teams.  These Care Teams include your primary Cardiologist (physician) and Advanced Practice Providers (APPs -  Physician Assistants and Nurse Practitioners) who all work together to provide you with the care you need, when you need it.  We recommend signing up for the patient portal called "MyChart".  Sign up information is provided on this After Visit Summary.  MyChart is used to connect with patients for Virtual Visits (Telemedicine).  Patients are able to view lab/test results, encounter notes, upcoming appointments, etc.  Non-urgent messages can be sent to your provider as well.   To learn more about what you can do with MyChart, go to [email or web address].    Your next appointment:   4-6 week(s)  The format for your next appointment:   In Person  Provider:   Buford Dresser, MD{    I,Mathew Stumpf,acting as a scribe for Buford Dresser, MD.,have documented all relevant documentation on the behalf of Buford Dresser, MD,as directed by  Buford Dresser, MD while in the presence of Buford Dresser, MD.  I, Buford Dresser, MD, have reviewed all documentation for this visit. The documentation on 06/21/22 for the exam, diagnosis, procedures, and orders are all accurate and complete.    Signed, Buford Dresser, MD PhD 06/06/2022     [facility]

## 2022-06-06 ENCOUNTER — Ambulatory Visit (HOSPITAL_COMMUNITY)
Admission: RE | Admit: 2022-06-06 | Discharge: 2022-06-06 | Disposition: A | Discharge status: Home / Self Care | Payer: PPO | Source: Ambulatory Visit | Attending: Vascular Surgery | Admitting: Vascular Surgery

## 2022-06-06 ENCOUNTER — Encounter (HOSPITAL_BASED_OUTPATIENT_CLINIC_OR_DEPARTMENT_OTHER): Payer: Self-pay | Admitting: Cardiology

## 2022-06-06 ENCOUNTER — Ambulatory Visit (INDEPENDENT_AMBULATORY_CARE_PROVIDER_SITE_OTHER): Payer: HMO | Admitting: Cardiology

## 2022-06-06 VITALS — BP 150/80 | HR 83 | Ht 72.0 in | Wt 208.2 lb

## 2022-06-06 DIAGNOSIS — Z7189 Other specified counseling: Secondary | ICD-10-CM

## 2022-06-06 DIAGNOSIS — E7800 Pure hypercholesterolemia, unspecified: Secondary | ICD-10-CM | POA: Diagnosis not present

## 2022-06-06 DIAGNOSIS — I6522 Occlusion and stenosis of left carotid artery: Secondary | ICD-10-CM | POA: Insufficient documentation

## 2022-06-06 DIAGNOSIS — I10 Essential (primary) hypertension: Secondary | ICD-10-CM

## 2022-06-06 DIAGNOSIS — I6523 Occlusion and stenosis of bilateral carotid arteries: Secondary | ICD-10-CM

## 2022-06-06 DIAGNOSIS — Z8673 Personal history of transient ischemic attack (TIA), and cerebral infarction without residual deficits: Secondary | ICD-10-CM

## 2022-06-06 MED ORDER — AMLODIPINE BESYLATE 5 MG PO TABS: 5.0000 mg | ORAL_TABLET | Freq: Every day | ORAL | 3 refills | Status: DC

## 2022-06-06 NOTE — Therapy (Signed)
OUTPATIENT PHYSICAL THERAPY NEURO TREATMENT   Patient Name: Ivan Lawson MRN: [id number] DOB:Jan 28, 1956, 66 y.o., male Today's Date: 06/07/2022   PCP: Gaynelle Arabian REFERRING PROVIDER: Lauraine Rinne   PT End of Session - 06/07/22 0930     Visit Number 9    Date for PT Re-Evaluation 07/02/22    Progress Note Due on Visit 10    PT Start Time 0930    PT Stop Time [id number]    PT Time Calculation (min) 45 min    Equipment Utilized During Treatment Gait belt    Activity Tolerance Patient tolerated treatment well    Behavior During Therapy WFL for tasks assessed/performed                     Past Medical History:  Diagnosis Date   Hyperthyroidism    Past Surgical History:  Procedure Laterality Date   [place]     Patient Active Problem List   Diagnosis Date Noted   Left pontine cerebrovascular accident ([place]) 04/16/2022   Basilar artery stenosis 04/13/2022   Stroke-like symptom 04/12/2022   Cerebral thrombosis with cerebral infarction 04/12/2022   Acute ischemic stroke ([place]) 04/06/2022   Sinus bradycardia 04/06/2022   Erythrocytosis 04/06/2022   Acquired hypothyroidism 04/06/2022   B12 deficiency 01/07/2018   Hypothyroidism following radioiodine therapy 09/25/2016   Elevated PSA 05/03/2016   Numbness 03/22/2016   HTN (hypertension) 11/22/2015   Bilateral hearing loss 11/22/2015   Tinnitus 11/22/2015    ONSET DATE: 04/06/22  REFERRING DIAG: I63.9  THERAPY DIAG:  Hemiplegia and hemiparesis following cerebral infarction affecting right dominant side (HCC)  Left pontine cerebrovascular accident ([place])  Other lack of coordination  Other abnormalities of gait and mobility  Rationale for Evaluation and Treatment Rehabilitation  SUBJECTIVE:                                                                                                                                                                                               SUBJECTIVE STATEMENT: "Doing fine. I have some stiffness in the mornings but it limbers up as I move"   Pt accompanied by: self  PERTINENT HISTORY: Acute L paramedian pons infarct w/ increased extent of involvement compared w/ 04/06/22  PMH includes gout that flared up last Wednesday, HTN, acquired hypothyroidism following radioactive iodine therapy for Graves' disease, and prediabetes  PAIN:  Are you having pain? Yes: NPRS scale: 0/10  PRECAUTIONS: Fall  WEIGHT BEARING RESTRICTIONS No  FALLS: Has patient fallen in last 6 months? No  LIVING ENVIRONMENT: Lives with: lives alone Lives  in: House/apartment Stairs: Yes: External: 5 steps; can reach both Has following equipment at home: Walker - 4 wheeled and Wheelchair (manual)  PLOF: Independent, retired  PATIENT GOALS be able to walk naturally, 10,000-12,000 steps a day again  OBJECTIVE:   UPPER EXTREMITY ROM: WFL UPPER EXTREMITY MMT: 4+ flexion R  LOWER EXTREMITY ROM:  WFL  MMT Right Eval Left Eval  Hip flexion 4 4+  Hip extension    Hip abduction    Hip adduction    Hip internal rotation 4 4  Hip external rotation 4 4  Knee flexion 4- 4+  Knee extension 4- 4+  Ankle dorsiflexion 5 5  Ankle plantarflexion 5 5  Ankle inversion    Ankle eversion     (Blank rows = not tested)   GAIT: Gait pattern: decreased arm swing- Right, decreased step length- Right, decreased stance time- Right, decreased ankle dorsiflexion- Right, ataxic, trendelenburg, lateral lean- Left, and decreased trunk rotation Distance walked: 243ft Assistive device utilized: None Level of assistance: Complete Independence Comments: Decreased step length, L shoulder height is elevated, favors LLE, trunk lean to L, trendelenberg , decreased speed  FUNCTIONAL TESTs:  5 times sit to stand: 12.55s Timed up and go (TUG): 11.05s Functional gait assessment: 13/30 SLS L 2 secs at best R unable to do w/o support  4 stage balance (30s) EO firm EC  firm min sway EO foam  EC foam CGA   TODAY'S TREATMENT:  06/07/22 Bike L6 x66mins Resisted gait 20# forwards and sidesteps, 15# backwards Hip abd/ext 5# 2x10 Step up and down on airex 6" 20reps STS on airex holding #3 2x10   06/05/22 Bike L5 x48mins Tandem walking on beam Side steps on beam  Tandem walking     Feet together catching yellow ball on airex    Forward lunges on BOSU x10 BLE   Farmers carry 20#    Lateral band walks with catch    Mini squats on BOSU [facility]    05/31/22 Nustep L5 x76mins Tandem on Airex   L 14 at best  R 12 at best    Side step onto airex 6" CGA Resisted gait w/heel tap 6" and side steps 20# Abd pushes on treadmill RLE only x10 Leg press 80# 2x10 x10 eccentric RLE, 40# RLE only  Cybex ext 35# BLEx10, RLE eccentric x10, RLE only x10 Cybex flex 25# BLE2x10, 15# RLE only x10   05/29/22 Nustep L5 x57mins Tandem walking  SLS  L at best 20s no hands  R at best 9s Walking hitting ball with direction changes  Step ups on 6" with airex on top, CGA Leg press 60# x10BLE, x10 eccentric RLE, 40#x10 RLE only   05/21/22 Bike L5, 8 mins Feet together on foam catch  Half tandem on foam catch Cybex ext  20# 2x10 Cybex flexion 15#, 20# 2x10  Resisted gait 20#    05/20/22 Nustep L5, 6 mins  Tandem walking w/modA Catch w/yellow ball standing on foam 20reps Walking on balance beam forwards/backwards/sideways SLS at best 7 on L, 5 on R Banded walks    05/16/22 Nustep L5, 8 mins Tandem walking modA, 2 LOB  Catching forwards, back, sideways  Side steps on foam, minA Walking on balance beam in parallel bars  Resisted gait 25# forwards/sideways, 15# backwards  05/24/22 Nustep L5,  Half tandem 30s  Tandem  L 15s   R 30s SLS  R 2,2,3  L 3, 4,4  Tandem walking 13steps x2 with modA  Walking backwards w/minA 2x63ft Side steps and catch  Step ups 6" no support, minA  Resisted gait forwards and sideways 20# minA   PATIENT  EDUCATION: Education details: POC, fall risk Person educated: Patient Education method: Explanation and Verbal cues Education comprehension: verbalized understanding and returned demonstration   HOME EXERCISE PROGRAM: TBD   GOALS: Goals reviewed with patient? Yes  SHORT TERM GOALS: Target date: 06/05/22  Patient will be independent with initial HEP. Goal status: INITIAL  2.  Patient will be educated on strategies to decrease risk of falls.  Goal status: INITIAL   LONG TERM GOALS: Target date: 07/02/22  Patient will be independent with advanced/ongoing HEP to improve outcomes and carryover.  Goal status: INITIAL  2.  Patient will demonstrate at least 20/30 on FGA to improve gait stability and reduce risk for falls. Baseline: 13/30 Goal status: INITIAL  3.  Patient will demonstrate decreased fall risk by scoring < 12 sec on 5xSTS. Goal status: INITIAL  4. Patient will demonstrate SLS >10 secs to decrease fall risk and improve balance.    Goal status: INITIAL   ASSESSMENT:  CLINICAL IMPRESSION: Patient has difficulty with hip ext and abd when standing on his RLE. He tends to lean towards the right and needs tactile cues to realign to the middle. STS onto airex requires heavy cueing for form because he bends too forward at the trunk. Able to correct when holding out weight but knees concave. Needed bands around knees to cue to push into abduction. He is showing good progress overall with strength and balance.  OBJECTIVE IMPAIRMENTS Abnormal gait, decreased balance, decreased coordination, and decreased strength.   ACTIVITY LIMITATIONS transfers and locomotion level  PARTICIPATION LIMITATIONS: driving, community activity, and yard work  PERSONAL FACTORS 1-2 comorbidities: HTN, hx of strokes  are also affecting patient's functional outcome.   REHAB POTENTIAL: Good  CLINICAL DECISION MAKING: Stable/uncomplicated  EVALUATION COMPLEXITY: Low  PLAN: PT FREQUENCY:  2x/week  PT DURATION: 8 weeks  PLANNED INTERVENTIONS: Therapeutic exercises, Therapeutic activity, Neuromuscular re-education, Balance training, Gait training, Patient/Family education, Joint mobilization, Stair training, Cryotherapy, Moist heat, Biofeedback, and Re-evaluation  PLAN FOR NEXT SESSION: static and dynamic balance activities, cognitive dual tasking   [place], PT 06/07/2022, 10:16 AM

## 2022-06-06 NOTE — Patient Instructions (Addendum)
Medication Instructions:  Increase amlodipine from 2.5 mg to 5 mg daily (can take 2 of the 2.5 mg pills). Do this for about 2 weeks. If blood pressure still consistently >140 on the top, increase to 10 mg amlodipine daily (can take 4 of the 2.5 mg tabs). We will have you come back in 4-6 weeks to check BP and see if we need to add additional meds. Goal is ~130/80. We don't want it too low given the stroke, so if you see <110/70 consistently please let us know.  *If you need a refill on your cardiac medications before your next appointment, please call your pharmacy*   Lab Work: None ordered today   Testing/Procedures: None ordered today   Follow-Up: At [facility], you and your health needs are our priority.  As part of our continuing mission to provide you with exceptional heart care, we have created designated Provider Care Teams.  These Care Teams include your primary Cardiologist (physician) and Advanced Practice Providers (APPs -  Physician Assistants and Nurse Practitioners) who all work together to provide you with the care you need, when you need it.  We recommend signing up for the patient portal called "MyChart".  Sign up information is provided on this After Visit Summary.  MyChart is used to connect with patients for Virtual Visits (Telemedicine).  Patients are able to view lab/test results, encounter notes, upcoming appointments, etc.  Non-urgent messages can be sent to your provider as well.   To learn more about what you can do with MyChart, go to [email or web address].    Your next appointment:   4-6 week(s)  The format for your next appointment:   In Person  Provider:   Jodelle Red, MD{

## 2022-06-07 ENCOUNTER — Ambulatory Visit: Payer: PPO | Admitting: Occupational Therapy

## 2022-06-07 ENCOUNTER — Ambulatory Visit: Payer: PPO

## 2022-06-07 ENCOUNTER — Encounter: Payer: Self-pay | Admitting: Occupational Therapy

## 2022-06-07 DIAGNOSIS — I639 Cerebral infarction, unspecified: Secondary | ICD-10-CM

## 2022-06-07 DIAGNOSIS — R278 Other lack of coordination: Secondary | ICD-10-CM

## 2022-06-07 DIAGNOSIS — R2689 Other abnormalities of gait and mobility: Secondary | ICD-10-CM

## 2022-06-07 DIAGNOSIS — I69351 Hemiplegia and hemiparesis following cerebral infarction affecting right dominant side: Secondary | ICD-10-CM | POA: Diagnosis not present

## 2022-06-07 NOTE — Therapy (Unsigned)
OUTPATIENT OCCUPATIONAL THERAPY TREATMENT NOTE & DISCHARGE SUMMARY   Patient Name: Ivan Lawson MRN: [id number] DOB:1955/12/05, 66 y.o., male 45 Date: 06/07/2022  PCP: Gaynelle Arabian, MD REFERRING PROVIDER: Cathlyn Parsons, PA-C    OT End of Session - 06/07/22 1020     Visit Number 5    Number of Visits 17    Date for OT Re-Evaluation 07/26/22    Authorization Type [employer]    Authorization Time Period VL: [place]    Progress Note Due on Visit 10    OT Start Time 1017    OT Stop Time 1057    OT Time Calculation (min) 40 min    Activity Tolerance Patient tolerated treatment well    Behavior During Therapy WFL for tasks assessed/performed            OCCUPATIONAL THERAPY DISCHARGE SUMMARY  Visits from Start of Care: 5  Current functional level related to goals / functional outcomes: Pt reports Mod Ind w/ all BADLs and most IADLs (no driving); see goals below -- pt has met 3/3 STGs and 5/5 LTGs   Remaining deficits: Mild FMC deficit of R hand compared to non-dominant hand; decreased coordination of RLE and limitations w/ high level balance and functional mobility skills currently being addressed in OP PT   Education / Equipment: CVA condition-specific education (including NMR, typical recovery patterns, BEFAST acronym for stroke recognition) and pt-specific HEP    Patient agrees to discharge. Patient goals were met. Patient is being discharged due to meeting the stated rehab goals and being pleased with his current functional level.   Past Medical History:  Diagnosis Date   Hyperthyroidism    Past Surgical History:  Procedure Laterality Date   BACK SURGERY  1990   FINGER SURGERY     Patient Active Problem List   Diagnosis Date Noted   Left pontine cerebrovascular accident ([place]) 04/16/2022   Basilar artery stenosis 04/13/2022   Stroke-like symptom 04/12/2022   Cerebral thrombosis with cerebral infarction 04/12/2022   Acute ischemic stroke  ([place]) 04/06/2022   Sinus bradycardia 04/06/2022   Erythrocytosis 04/06/2022   Acquired hypothyroidism 04/06/2022   B12 deficiency 01/07/2018   Hypothyroidism following radioiodine therapy 09/25/2016   Elevated PSA 05/03/2016   Numbness 03/22/2016   HTN (hypertension) 11/22/2015   Bilateral hearing loss 11/22/2015   Tinnitus 11/22/2015    ONSET DATE: 04/12/22  REFERRING DIAG: I63.9 (ICD-10-CM) - Cerebral infarction, unspecified   THERAPY DIAG: Hemiplegia and hemiparesis following cerebral infarction affecting right dominant side (HCC)  Left pontine cerebrovascular accident ([place])  Other lack of coordination  Other abnormalities of gait and mobility  Rationale for Evaluation and Treatment Rehabilitation  SUBJECTIVE:   SUBJECTIVE STATEMENT: "The theraband exercises are the ticket;" pt reports things have been going really well and he does not have any functional concerns at this time Pt accompanied by: self  PAIN: Are you having pain? 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; Training and development officer  PATIENT GOALS: Get dexterity back in R hand; return to fishing, throwing a baseball  OBJECTIVE:   TODAY'S TREATMENT - 06/07/22: Using 9-HPT to continue to address RUE [facility] w/ increased speed and accuracy and reassess progress toward goals; completed 3x w/  OT providing verbal cues for typical movement patterns prn. Task graded up w/ pt picking up pegs one at a time and rotating in-hand to place back into pegboard; OT encouraged pt to incorporate this skill into coordination activities HEP, attempting w/ differently sized hex bolts See  'Education' below   PATIENT EDUCATION: Ongoing condition-specific education, answering pt questions as best as able Reviewed full HEP and provided recommendations for progression of exercises prn Reassessed progress toward remaining goals and provided corresponding education and interpretation of results/change Person educated: Patient Education method: Explanation and Demonstration Education comprehension: verbalized understanding and returned demonstration   HOME EXERCISE PROGRAM: Coordination activities (printed handout) MedBridge Code: [email or web address] - Seated Single Arm Shoulder Flexion  - 2 x daily - 2 sets - 15 reps - Seated Shoulder External Rotation with Resistance  - 2 x daily - 2 sets - 15 reps - Seated Shoulder Row with Resistance Anchored at Feet  - 2 x daily - 2 sets - 15 reps - Seated Elbow Flexion with Resistance  - 2 x daily - 2 sets - 15 reps   GOALS: Goals reviewed with patient? Yes  SHORT TERM GOALS: Target date: 06/16/22  STG  Status:  1 Pt will increase GMC of R, dominant UE, as evidenced by increasing Box and Blocks score by at least 4 Baseline: Right 36 blocks, Left 51 blocks Met - 05/31/22; 51 w/ RUE  2 Pt will demonstrate independence w/ initial HEP designed for gross and fine motor control/coordination of RUE Met - 06/07/22  3 Pt will improve grip strength of R hand to at least 60 lbs for improved functional use of R, dominant UE Baseline: Right: 53 lbs; Left: 100 lbs Met - 05/31/22; 65 lbs w/ RUE     LONG TERM GOALS: Target date: 07/26/22  LTG  Status:  1 Pt will improve score of UEFI to at least 64/80 to indicate improved functional use of RUE during all ADLs Baseline: 58/80 Met - 06/07/22: 75/80  2 Pt will improve grip strength of R hand to at least 75 lbs for improved functional use of R, dominant UE by d/c Baseline: Right: 53 lbs; Left: 100 lbs Met - 06/07/22: 78 lb w/ RUE  3 Pt will increase GMC of R, dominant UE, as evidenced by increasing Box and Blocks  score to within 4 blocks compared to L side Baseline: Right 36 blocks, Left 51 blocks Met - 05/31/22; 51 w/ RUE  4 Pt to improve participation in functional FM tasks w/ R, dominant hand as evidenced by decreasing time to complete 9-HPT by at least 4 sec Baseline: Right: 38.4 sec; Left: 27.1 sec Met - 06/07/22: 32.09 sec w/ RUE 05/31/22: 33.94 sec  5 Pt will demonstrate improved RUE GMC and coordination as evidenced by increasing accuracy w/ finger-to-nose test within 20 sec Baseline: to be assessed Met - 06/07/22: 14 taps in 20 sec     ASSESSMENT:  CLINICAL IMPRESSION: Mr. Quintela is a 66 y/o male who has been seen in OP OT for RUE weakness as well as decreased control and coordination s/p L pontine CVA in May 2023. Considering improvements and current functional status, OT reassessed progress toward goals, providing relevant interpretation of results and changes. Pt has shown improvements in all areas addressed in OP therapy w/ STGs and LTGs met. In preparation for d/c, OT completed condition-specific education, reviewed and updated HEP, and discussed current participation in functional daily activities. Pt is appropriate for d/c from skilled OT to HEP  at this time, reports he is satisfied with progress, and is currently agreeable to discharge plan.  PERFORMANCE DEFICITS in functional skills including ADLs, IADLs, coordination, dexterity, sensation, strength, FMC, GMC, mobility, balance, decreased knowledge of precautions, decreased knowledge of use of DME, and UE functional use, and cognitive skills including safety awareness.  IMPAIRMENTS are limiting patient from IADLs, work, and leisure.   COMORBIDITIES may have co-morbidities  that affects occupational performance. Patient will benefit from skilled OT to address above impairments and improve overall function.   PLAN: OT FREQUENCY: 2x/week  OT DURATION: 8 weeks  PLANNED INTERVENTIONS: self care/ADL training, therapeutic exercise,  therapeutic activity, neuromuscular re-education, manual therapy, balance training, functional mobility training, electrical stimulation, moist heat, cryotherapy, patient/family education, and DME and/or AE instructions  RECOMMENDED OTHER SERVICES: Currently receiving PT services at this location  CONSULTED AND AGREED WITH PLAN OF CARE: Patient  PLAN FOR NEXT SESSION: D/C   Kathrine Cords, MSOT, OTR/L 06/07/2022, 10:57 AM

## 2022-06-11 ENCOUNTER — Ambulatory Visit: Payer: PPO

## 2022-06-11 DIAGNOSIS — I69351 Hemiplegia and hemiparesis following cerebral infarction affecting right dominant side: Secondary | ICD-10-CM

## 2022-06-11 DIAGNOSIS — I639 Cerebral infarction, unspecified: Secondary | ICD-10-CM

## 2022-06-11 DIAGNOSIS — R2689 Other abnormalities of gait and mobility: Secondary | ICD-10-CM

## 2022-06-11 DIAGNOSIS — R278 Other lack of coordination: Secondary | ICD-10-CM

## 2022-06-11 NOTE — Therapy (Signed)
OUTPATIENT PHYSICAL THERAPY NEURO TREATMENT   Patient Name: Ivan Lawson MRN: [id number] DOB:1956/11/18, 66 y.o., male Today's Date: 06/11/2022   PCP: Gaynelle Arabian REFERRING PROVIDER: Lauraine Rinne   PT End of Session - 06/11/22 1058     Visit Number 10    Date for PT Re-Evaluation 07/02/22    Progress Note Due on Visit 10    PT Start Time 1100    PT Stop Time 1143    PT Time Calculation (min) 43 min    Equipment Utilized During Treatment Gait belt    Activity Tolerance Patient tolerated treatment well    Behavior During Therapy WFL for tasks assessed/performed                      Past Medical History:  Diagnosis Date   Hyperthyroidism    Past Surgical History:  Procedure Laterality Date   [place]   FINGER SURGERY     Patient Active Problem List   Diagnosis Date Noted   Left pontine cerebrovascular accident ([place]) 04/16/2022   Basilar artery stenosis 04/13/2022   Stroke-like symptom 04/12/2022   Cerebral thrombosis with cerebral infarction 04/12/2022   Acute ischemic stroke ([place]) 04/06/2022   Sinus bradycardia 04/06/2022   Erythrocytosis 04/06/2022   Acquired hypothyroidism 04/06/2022   B12 deficiency 01/07/2018   Hypothyroidism following radioiodine therapy 09/25/2016   Elevated PSA 05/03/2016   Numbness 03/22/2016   HTN (hypertension) 11/22/2015   Bilateral hearing loss 11/22/2015   Tinnitus 11/22/2015   Progress Note Reporting Period 05/07/22 to 06/11/22  See note below for Objective Data and Assessment of Progress/Goals.     ONSET DATE: 04/06/22  REFERRING DIAG: I63.9  THERAPY DIAG:  Hemiplegia and hemiparesis following cerebral infarction affecting right dominant side (HCC)  Left pontine cerebrovascular accident [facility])  Other lack of coordination  Other abnormalities of gait and mobility  Rationale for Evaluation and Treatment Rehabilitation  SUBJECTIVE:                                                                                                                                                                                               SUBJECTIVE STATEMENT: "Doing fine. I have some stiffness in the mornings but it limbers up as I move"   Pt accompanied by: self  PERTINENT HISTORY: Acute L paramedian pons infarct w/ increased extent of involvement compared w/ 04/06/22  PMH includes gout that flared up last Wednesday, HTN, acquired hypothyroidism following radioactive iodine therapy for Graves' disease, and prediabetes  PAIN:  Are you having pain? Yes: NPRS scale: 0/10  PRECAUTIONS: Fall  WEIGHT BEARING RESTRICTIONS No  FALLS: Has patient fallen in last 6 months? No  LIVING ENVIRONMENT: Lives with: lives alone Lives in: House/apartment Stairs: Yes: External: 5 steps; can reach both Has following equipment at home: Walker - 4 wheeled and Wheelchair (manual)  PLOF: Independent, retired  PATIENT GOALS be able to walk naturally, 10,000-12,000 steps a day again  OBJECTIVE:   UPPER EXTREMITY ROM: WFL UPPER EXTREMITY MMT: 4+ flexion R  LOWER EXTREMITY ROM:  [facility]  MMT Right Eval Left Eval Right 7/11 Left  7/11  Hip flexion 4 4+ 4+ 5  Hip extension      Hip abduction      Hip adduction      Hip internal rotation 4 4 4+ 4+  Hip external rotation 4 4 4+ 4+  Knee flexion 4- 4+ 4+ 5  Knee extension 4- 4+ 4+ 5  Ankle dorsiflexion '5 5 5 5  ' Ankle plantarflexion '5 5  5  ' Ankle inversion      Ankle eversion       (Blank rows = not tested)   GAIT: Gait pattern: decreased arm swing- Right, decreased step length- Right, decreased stance time- Right, decreased ankle dorsiflexion- Right, ataxic, trendelenburg, lateral lean- Left, and decreased trunk rotation Distance walked: 250f Assistive device utilized: None Level of assistance: Complete Independence Comments: Decreased step length, L shoulder height is elevated, favors LLE, trunk lean to L, trendelenberg , decreased speed  FUNCTIONAL  TESTs:  5 times sit to stand: 12.55s, 9.20s  Timed up and go (TUG): 11.05s, 10.49s Functional gait assessment: 13/30 SLS L 2 secs at best R unable to do w/o support  4 stage balance (30s) EO firm EC firm min sway EO foam  EC foam CGA   TODAY'S TREATMENT:  06/11/22 Reassess  Strength  5xSTS 9.20s TUG 10.49  4 stage balance able to stand on foam with minimal to no sway, steady balance. Min sway with eyes closed but able to find his balance.   SLS  R 6   L 12  FGA 22/30  Nustep L5 x574ms HS stretch 30s each x2 SK2C 30s each     06/07/22 Bike L6 x6m27m Resisted gait 20# forwards and sidesteps, 15# backwards Hip abd/ext 5# 2x10 Step up and down on airex 6" 20reps STS on airex holding #3 2x10   06/05/22 Bike L5 x6mi62mTandem walking on beam Side steps on beam  Tandem walking     Feet together catching yellow ball on airex    Forward lunges on BOSU x10 BLE   Farmers carry 20#    Lateral band walks with catch    [facility]OSU [facility] Nustep L5 x6min51mandem on Airex   L 14 at best  R 12 at best    Side step onto airex 6" CGA Resisted gait w/heel tap 6" and side steps 20# Abd pushes on treadmill RLE only x10 Leg press 80# 2x10 x10 eccentric RLE, 40# RLE only  Cybex ext 35# BLEx10, RLE eccentric x10, RLE only x10 Cybex flex 25# BLE2x10, 15# RLE only x10   05/29/22 Nustep L5 x6mins38mndem walking  SLS  L at best 20s no hands  R at best 9s Walking hitting ball with direction changes  Step ups on 6" with airex on top, CGA Leg press 60# x10BLE, x10 eccentric RLE, 40#x10 RLE only   05/21/22 Bike L5, 8 mins Feet together on foam catch  Half tandem on foam catch Cybex ext  20# 2x10 Cybex flexion 15#, 20# 2x10  Resisted gait 20#    05/20/22 Nustep L5, 6 mins  Tandem walking w/modA Catch w/yellow ball standing on foam 20reps Walking on balance beam forwards/backwards/sideways SLS at best 7 on L, 5 on R Banded walks     05/16/22 Nustep L5, 8 mins Tandem walking modA, 2 LOB  Catching forwards, back, sideways  Side steps on foam, minA Walking on balance beam in parallel bars  Resisted gait 25# forwards/sideways, 15# backwards  05/24/22 Nustep L5, 37mns  Half tandem 30s  Tandem  L 15s   R 30s SLS  R 2,2,3  L 3, 4,4  Tandem walking 13steps x2 with modA  Walking backwards w/minA 2x235fSide steps and catch  Step ups 6" no support, minA  Resisted gait forwards and sideways 20# minA   PATIENT EDUCATION: Education details: POC, fall risk Person educated: Patient Education method: Explanation and Verbal cues Education comprehension: verbalized understanding and returned demonstration   HOME EXERCISE PROGRAM: [id number]   GOALS: Goals reviewed with patient? Yes  SHORT TERM GOALS: Target date: 06/05/22  Patient will be independent with initial HEP. Goal status: IN PROGRESS  2.  Patient will be educated on strategies to decrease risk of falls.  Goal status: MET   LONG TERM GOALS: Target date: 07/02/22  Patient will be independent with advanced/ongoing HEP to improve outcomes and carryover.  Goal status: INITIAL  2.  Patient will demonstrate at least 25/30 on FGA to improve gait stability and reduce risk for falls. Baseline: 13/30, 22/30 Goal status: REVISED  3.  Patient will demonstrate decreased fall risk by scoring < 12 sec on 5xSTS. Goal status: MET  4. Patient will demonstrate SLS >10 secs to decrease fall risk and improve balance.   Goal status: IN PROGRESS    5. Patient will be able to stand with narrow base for >10s and walk at least 10 tandem. steps   Goal status: INITIAL  ASSESSMENT:  CLINICAL IMPRESSION: Completed a progress note today. He has made great progress in regards to balance and gait to decrease fall risk. Patient states he is able to walk around without having to reach out and hold on to stuff. He also states he has noticed that he is getting stronger and  his balance is better. Still having trouble with tandem walking and single leg stance. Revised his goals for FGA and added a goal for tandem standing and walking.   OBJECTIVE IMPAIRMENTS Abnormal gait, decreased balance, decreased coordination, and decreased strength.   ACTIVITY LIMITATIONS transfers and locomotion level  PARTICIPATION LIMITATIONS: driving, community activity, and yard work  PERSONAL FACTORS 1-2 comorbidities: HTN, hx of strokes  are also affecting patient's functional outcome.   REHAB POTENTIAL: Good  CLINICAL DECISION MAKING: Stable/uncomplicated  EVALUATION COMPLEXITY: Low  PLAN: PT FREQUENCY: 2x/week  PT DURATION: 8 weeks  PLANNED INTERVENTIONS: Therapeutic exercises, Therapeutic activity, Neuromuscular re-education, Balance training, Gait training, Patient/Family education, Joint mobilization, Stair training, Cryotherapy, Moist heat, Biofeedback, and Re-evaluation  PLAN FOR NEXT SESSION: static and dynamic balance activities, cognitive dual tasking   MoOakleyPT 06/11/2022, 11:45 AM

## 2022-06-14 ENCOUNTER — Ambulatory Visit: Payer: PPO

## 2022-06-14 DIAGNOSIS — I639 Cerebral infarction, unspecified: Secondary | ICD-10-CM

## 2022-06-14 DIAGNOSIS — R278 Other lack of coordination: Secondary | ICD-10-CM

## 2022-06-14 DIAGNOSIS — R2689 Other abnormalities of gait and mobility: Secondary | ICD-10-CM

## 2022-06-14 DIAGNOSIS — I69351 Hemiplegia and hemiparesis following cerebral infarction affecting right dominant side: Secondary | ICD-10-CM | POA: Diagnosis not present

## 2022-06-14 NOTE — Therapy (Signed)
OUTPATIENT PHYSICAL THERAPY NEURO TREATMENT   Patient Name: Ivan Lawson MRN: [id number] DOB:1956/05/26, 66 y.o., male Today's Date: 06/14/2022   PCP: Gaynelle Arabian REFERRING PROVIDER: Lauraine Rinne   PT End of Session - 06/14/22 1103     Visit Number 11    Date for PT Re-Evaluation 07/02/22    Progress Note Due on Visit 10    PT Start Time 1103    PT Stop Time 1145    PT Time Calculation (min) 42 min    Equipment Utilized During Treatment Gait belt    Activity Tolerance Patient tolerated treatment well    Behavior During Therapy WFL for tasks assessed/performed                       Past Medical History:  Diagnosis Date   Hyperthyroidism    Past Surgical History:  Procedure Laterality Date   [place]     Patient Active Problem List   Diagnosis Date Noted   Left pontine cerebrovascular accident ([place]) 04/16/2022   Basilar artery stenosis 04/13/2022   Stroke-like symptom 04/12/2022   Cerebral thrombosis with cerebral infarction 04/12/2022   Acute ischemic stroke ([place]) 04/06/2022   Sinus bradycardia 04/06/2022   Erythrocytosis 04/06/2022   Acquired hypothyroidism 04/06/2022   B12 deficiency 01/07/2018   Hypothyroidism following radioiodine therapy 09/25/2016   Elevated PSA 05/03/2016   Numbness 03/22/2016   HTN (hypertension) 11/22/2015   Bilateral hearing loss 11/22/2015   Tinnitus 11/22/2015   Progress Note Reporting Period 05/07/22 to 06/11/22  See note below for Objective Data and Assessment of Progress/Goals.     ONSET DATE: 04/06/22  REFERRING DIAG: I63.9  THERAPY DIAG:  Hemiplegia and hemiparesis following cerebral infarction affecting right dominant side (HCC)  Left pontine cerebrovascular accident [facility])  Other lack of coordination  Other abnormalities of gait and mobility  Rationale for Evaluation and Treatment Rehabilitation  SUBJECTIVE:                                                                                                                                                                                               SUBJECTIVE STATEMENT: Doing okay, strength is getting better but the balance I am still working on.   Pt accompanied by: self  PERTINENT HISTORY: Acute L paramedian pons infarct w/ increased extent of involvement compared w/ 04/06/22  PMH includes gout that flared up last Wednesday, HTN, acquired hypothyroidism following radioactive iodine therapy for Graves' disease, and prediabetes  PAIN:  Are you having pain? Yes: NPRS scale: 0/10  PRECAUTIONS: Fall  WEIGHT  BEARING RESTRICTIONS No  FALLS: Has patient fallen in last 6 months? No  LIVING ENVIRONMENT: Lives with: lives alone Lives in: House/apartment Stairs: Yes: External: 5 steps; can reach both Has following equipment at home: Walker - 4 wheeled and Wheelchair (manual)  PLOF: Independent, retired  PATIENT GOALS be able to walk naturally, 10,000-12,000 steps a day again  OBJECTIVE:   UPPER EXTREMITY ROM: WFL UPPER EXTREMITY MMT: 4+ flexion R  LOWER EXTREMITY ROM:  [facility]  MMT Right Eval Left Eval Right 7/11 Left  7/11  Hip flexion 4 4+ 4+ 5  Hip extension      Hip abduction      Hip adduction      Hip internal rotation 4 4 4+ 4+  Hip external rotation 4 4 4+ 4+  Knee flexion 4- 4+ 4+ 5  Knee extension 4- 4+ 4+ 5  Ankle dorsiflexion _0 Ankle plantarflexion _1 Ankle inversion      Ankle eversion       (Blank rows = not tested)   GAIT: Gait pattern: decreased arm swing- Right, decreased step length- Right, decreased stance time- Right, decreased ankle dorsiflexion- Right, ataxic, trendelenburg, lateral lean- Left, and decreased trunk rotation Distance walked: 214f Assistive device utilized: None Level of assistance: Complete Independence Comments: Decreased step length, L shoulder height is elevated, favors LLE, trunk lean to L, trendelenberg , decreased speed  FUNCTIONAL  TESTs:  5 times sit to stand: 12.55s, 9.20s  Timed up and go (TUG): 11.05s, 10.49s Functional gait assessment: 13/30 SLS L 2 secs at best R unable to do w/o support  4 stage balance (30s) EO firm EC firm min sway EO foam  EC foam CGA   TODAY'S TREATMENT:  06/14/22 Bike L5 x548ms Backwards walking on treadmill  Tandem standing 30s each side  Tandem standing catching ball on firm  Mini squats on BOSU w/fingertip support Forwards and side lunges on BOSU x10 each side SL mini squats w/2HHA in // bars   06/11/22 Reassess  Strength  5xSTS 9.20s TUG 10.49 4 stage balance able to stand on foam with minimal to no sway, steady balance. Min sway with eyes closed but able to find his balance.   SLS  R 6   L 12  FGA 22/30  Nustep L5 x5m57m HS stretch 30s each x2 SK2C 30s each    06/07/22 Bike L6 x6mi81mResisted gait 20# forwards and sidesteps, 15# backwards Hip abd/ext 5# 2x10 Step up and down on airex 6" 20reps STS on airex holding #3 2x10   06/05/22 Bike L5 x6min16mandem walking on beam Side steps on beam  Tandem walking     Feet together catching yellow ball on airex    Forward lunges on BOSU x10 BLE   Farmers carry 20#    Lateral band walks with catch    Mini squats on BOSU 2H[facility]/30/23 Nustep L5 x6mins64mndem on Airex   L 14 at best  R 12 at best    Side step onto airex 6" CGA Resisted gait w/heel tap 6" and side steps 20# Abd pushes on treadmill RLE only x10 Leg press 80# 2x10 x10 eccentric RLE, 40# RLE only  Cybex ext 35# BLEx10, RLE eccentric x10, RLE only x10 Cybex flex 25# BLE2x10, 15# RLE only x10   05/29/22 Nustep L5 x6mins 70mdem walking  SLS  L at best 20s no hands  R at best 9s Walking hitting ball  with direction changes  Step ups on 6" with airex on top, CGA Leg press 60# x10BLE, x10 eccentric RLE, 40#x10 RLE only   05/21/22 Bike L5, 8 mins Feet together on foam catch  Half tandem on foam catch Cybex ext  20# 2x10 Cybex flexion  15#, 20# 2x10  Resisted gait 20#    05/20/22 Nustep L5, 6 mins  Tandem walking w/modA Catch w/yellow ball standing on foam 20reps Walking on balance beam forwards/backwards/sideways SLS at best 7 on L, 5 on R Banded walks    05/16/22 Nustep L5, 8 mins Tandem walking modA, 2 LOB  Catching forwards, back, sideways  Side steps on foam, minA Walking on balance beam in parallel bars  Resisted gait 25# forwards/sideways, 15# backwards  05/24/22 Nustep L5, 25mns  Half tandem 30s  Tandem  L 15s   R 30s SLS  R 2,2,3  L 3, 4,4  Tandem walking 13steps x2 with modA  Walking backwards w/minA 2x228fSide steps and catch  Step ups 6" no support, minA  Resisted gait forwards and sideways 20# minA   PATIENT EDUCATION: Education details: POC, fall risk Person educated: Patient Education method: Explanation and Verbal cues Education comprehension: verbalized understanding and returned demonstration   HOME EXERCISE PROGRAM: [id number]   GOALS: Goals reviewed with patient? Yes  SHORT TERM GOALS: Target date: 06/05/22  Patient will be independent with initial HEP. Goal status: IN PROGRESS  2.  Patient will be educated on strategies to decrease risk of falls.  Goal status: MET   LONG TERM GOALS: Target date: 07/02/22  Patient will be independent with advanced/ongoing HEP to improve outcomes and carryover.  Goal status: INITIAL  2.  Patient will demonstrate at least 25/30 on FGA to improve gait stability and reduce risk for falls. Baseline: 13/30, 22/30 Goal status: REVISED  3.  Patient will demonstrate decreased fall risk by scoring < 12 sec on 5xSTS. Goal status: MET  4. Patient will demonstrate SLS >10 secs to decrease fall risk and improve balance.   Goal status: IN PROGRESS    5. Patient will be able to stand with narrow base for >10s and walk at least 10 tandem. steps   Goal status: INITIAL  ASSESSMENT:  CLINICAL IMPRESSION:  Continued with balance and LE  strengthening tasks. Some difficulty with backwards walking on treadmill to start and maintain momentum. More difficulty with tandem standing and catching when RLE is in the back. Tolerates high level balance well with some support, has the most difficulty with lunges on BOSU due to LOB. Unable to do SL squats to chair do modified to do with supports in bars. He will benefit from progressive strength and balance training.   OBJECTIVE IMPAIRMENTS Abnormal gait, decreased balance, decreased coordination, and decreased strength.   ACTIVITY LIMITATIONS transfers and locomotion level  PARTICIPATION LIMITATIONS: driving, community activity, and yard work  PERSONAL FACTORS 1-2 comorbidities: HTN, hx of strokes  are also affecting patient's functional outcome.   REHAB POTENTIAL: Good  CLINICAL DECISION MAKING: Stable/uncomplicated  EVALUATION COMPLEXITY: Low  PLAN: PT FREQUENCY: 2x/week  PT DURATION: 8 weeks  PLANNED INTERVENTIONS: Therapeutic exercises, Therapeutic activity, Neuromuscular re-education, Balance training, Gait training, Patient/Family education, Joint mobilization, Stair training, Cryotherapy, Moist heat, Biofeedback, and Re-evaluation  PLAN FOR NEXT SESSION: static and dynamic balance activities, cognitive dual tasking   MoWiltonPT 06/14/2022, 11:46 AM

## 2022-06-19 ENCOUNTER — Encounter: Payer: Self-pay | Admitting: Physical Therapy

## 2022-06-19 ENCOUNTER — Ambulatory Visit: Payer: PPO | Admitting: Physical Therapy

## 2022-06-19 DIAGNOSIS — R2689 Other abnormalities of gait and mobility: Secondary | ICD-10-CM

## 2022-06-19 DIAGNOSIS — R278 Other lack of coordination: Secondary | ICD-10-CM

## 2022-06-19 DIAGNOSIS — I69351 Hemiplegia and hemiparesis following cerebral infarction affecting right dominant side: Secondary | ICD-10-CM

## 2022-06-19 DIAGNOSIS — I639 Cerebral infarction, unspecified: Secondary | ICD-10-CM

## 2022-06-19 NOTE — Therapy (Signed)
OUTPATIENT PHYSICAL THERAPY NEURO TREATMENT   Patient Name: Ivan Lawson MRN: [id number] DOB:September 22, 1956, 66 y.o., male Today's Date: 06/19/2022   PCP: Gaynelle Arabian REFERRING PROVIDER: Lauraine Rinne   PT End of Session - 06/19/22 1307     Visit Number 12    Date for PT Re-Evaluation 07/02/22    Progress Note Due on Visit 10    PT Start Time 1238    PT Stop Time 1312    PT Time Calculation (min) 34 min    Equipment Utilized During Treatment Gait belt    Activity Tolerance Patient tolerated treatment well    Behavior During Therapy WFL for tasks assessed/performed                        Past Medical History:  Diagnosis Date   Hyperthyroidism    Past Surgical History:  Procedure Laterality Date   [place]     Patient Active Problem List   Diagnosis Date Noted   Left pontine cerebrovascular accident ([place]) 04/16/2022   Basilar artery stenosis 04/13/2022   Stroke-like symptom 04/12/2022   Cerebral thrombosis with cerebral infarction 04/12/2022   Acute ischemic stroke ([place]) 04/06/2022   Sinus bradycardia 04/06/2022   Erythrocytosis 04/06/2022   Acquired hypothyroidism 04/06/2022   B12 deficiency 01/07/2018   Hypothyroidism following radioiodine therapy 09/25/2016   Elevated PSA 05/03/2016   Numbness 03/22/2016   HTN (hypertension) 11/22/2015   Bilateral hearing loss 11/22/2015   Tinnitus 11/22/2015   Progress Note Reporting Period 05/07/22 to 06/11/22  See note below for Objective Data and Assessment of Progress/Goals.     ONSET DATE: 04/06/22  REFERRING DIAG: I63.9  THERAPY DIAG:  Hemiplegia and hemiparesis following cerebral infarction affecting right dominant side (HCC)  Left pontine cerebrovascular accident [facility])  Other lack of coordination  Other abnormalities of gait and mobility  Rationale for Evaluation and Treatment Rehabilitation  SUBJECTIVE:                                                                                                                                                                                               SUBJECTIVE STATEMENT: Patient reports no new issues. He is performing his HEP. He feels like his balance is improving, but He still feels somewhat unsteady, especially with higher level challenging  Pt accompanied by: self  PERTINENT HISTORY: Acute L paramedian pons infarct w/ increased extent of involvement compared w/ 04/06/22  PMH includes gout that flared up last Wednesday, HTN, acquired hypothyroidism following radioactive iodine therapy for Graves' disease, and prediabetes  PAIN:  Are you having pain? Yes: NPRS scale: 0/10  PRECAUTIONS: Fall  WEIGHT BEARING RESTRICTIONS No  FALLS: Has patient fallen in last 6 months? No  LIVING ENVIRONMENT: Lives with: lives alone Lives in: House/apartment Stairs: Yes: External: 5 steps; can reach both Has following equipment at home: Walker - 4 wheeled and Wheelchair (manual)  PLOF: Independent, retired  PATIENT GOALS be able to walk naturally, 10,000-12,000 steps a day again  OBJECTIVE:   UPPER EXTREMITY ROM: WFL UPPER EXTREMITY MMT: 4+ flexion R  LOWER EXTREMITY ROM:  [facility]  MMT Right Eval Left Eval Right 7/11 Left  7/11  Hip flexion 4 4+ 4+ 5  Hip extension      Hip abduction      Hip adduction      Hip internal rotation 4 4 4+ 4+  Hip external rotation 4 4 4+ 4+  Knee flexion 4- 4+ 4+ 5  Knee extension 4- 4+ 4+ 5  Ankle dorsiflexion '5 5 5 5  ' Ankle plantarflexion '5 5  5  ' Ankle inversion      Ankle eversion       (Blank rows = not tested)   GAIT: Gait pattern: decreased arm swing- Right, decreased step length- Right, decreased stance time- Right, decreased ankle dorsiflexion- Right, ataxic, trendelenburg, lateral lean- Left, and decreased trunk rotation Distance walked: 235f Assistive device utilized: None Level of assistance: Complete Independence Comments: Decreased step length, L  shoulder height is elevated, favors LLE, trunk lean to L, trendelenberg , decreased speed  FUNCTIONAL TESTs:  5 times sit to stand: 12.55s, 9.20s  Timed up and go (TUG): 11.05s, 10.49s Functional gait assessment: 13/30 SLS L 2 secs at best R unable to do w/o support  4 stage balance (30s) EO firm EC firm min sway EO foam  EC foam CGA   TODAY'S TREATMENT:  06/19/22 Nu-Step L5 x 6 minutes. Progressive single limb challenges- stood in front of 6" step, alternating taps on steps, progressed to multiple taps with each foot, then tapping with rotation. 10 reps each B. Stood in front of 5 cones placed in semicircle on the floor-alternately tapping each cone then progressed to tapping 2 cones and then 3 cones in a row, including rotation across midline. He required occasional light steadying A. Monster walks with red Tband around ankles, 2 x 20', occasional min a for balance. Shuffling R and L, 2 x 20'. Patient was able to achieve and maintain a true shuffle, slightly slow. Jump switch with UUE support, lateral, then in stride-more difficulty with stride, but able to perform both and started to increase speed.  06/14/22 Bike L5 x561ms Backwards walking on treadmill  Tandem standing 30s each side  Tandem standing catching ball on firm  Mini squats on BOSU w/fingertip support Forwards and side lunges on BOSU x10 each side SL mini squats w/2HHA in // bars   06/11/22 Reassess  Strength  5xSTS 9.20s TUG 10.49 4 stage balance able to stand on foam with minimal to no sway, steady balance. Min sway with eyes closed but able to find his balance.   SLS  R 6   L 12  FGA 22/30  Nustep L5 x5m33m HS stretch 30s each x2 SK2C 30s each    06/07/22 Bike L6 x6mi21mResisted gait 20# forwards and sidesteps, 15# backwards Hip abd/ext 5# 2x10 Step up and down on airex 6" 20reps STS on airex holding #3 2x10   06/05/22 Bike L5 x6min62mandem walking on beam Side steps on beam  Tandem walking      Feet together catching yellow ball on airex    Forward lunges on BOSU x10 BLE   Farmers carry 20#    Lateral band walks with catch    Mini squats on BOSU [facility]    05/31/22 Nustep L5 x13mns Tandem on Airex   L 14 at best  R 12 at best    Side step onto airex 6" CGA Resisted gait w/heel tap 6" and side steps 20# Abd pushes on treadmill RLE only x10 Leg press 80# 2x10 x10 eccentric RLE, 40# RLE only  Cybex ext 35# BLEx10, RLE eccentric x10, RLE only x10 Cybex flex 25# BLE2x10, 15# RLE only x10   05/29/22 Nustep L5 x61ms Tandem walking  SLS  L at best 20s no hands  R at best 9s Walking hitting ball with direction changes  Step ups on 6" with airex on top, CGA Leg press 60# x10BLE, x10 eccentric RLE, 40#x10 RLE only   05/21/22 Bike L5, 8 mins Feet together on foam catch  Half tandem on foam catch Cybex ext  20# 2x10 Cybex flexion 15#, 20# 2x10  Resisted gait 20#    05/20/22 Nustep L5, 6 mins  Tandem walking w/modA Catch w/yellow ball standing on foam 20reps Walking on balance beam forwards/backwards/sideways SLS at best 7 on L, 5 on R Banded walks    05/16/22 Nustep L5, 8 mins Tandem walking modA, 2 LOB  Catching forwards, back, sideways  Side steps on foam, minA Walking on balance beam in parallel bars  Resisted gait 25# forwards/sideways, 15# backwards  05/24/22 Nustep L5, 42m21m  Half tandem 30s  Tandem  L 15s   R 30s SLS  R 2,2,3  L 3, 4,4  Tandem walking 13steps x2 with modA  Walking backwards w/minA 2x20f13fde steps and catch  Step ups 6" no support, minA  Resisted gait forwards and sideways 20# minA   PATIENT EDUCATION: Education details: POC, fall risk Person educated: Patient Education method: Explanation and Verbal cues Education comprehension: verbalized understanding and returned demonstration   HOME EXERCISE PROGRAM: [id number]   GOALS: Goals reviewed with patient? Yes  SHORT TERM GOALS: Target date: 06/05/22  Patient will be  independent with initial HEP. Goal status: IN PROGRESS  2.  Patient will be educated on strategies to decrease risk of falls.  Goal status: MET   LONG TERM GOALS: Target date: 07/02/22  Patient will be independent with advanced/ongoing HEP to improve outcomes and carryover.  Goal status: INITIAL  2.  Patient will demonstrate at least 25/30 on FGA to improve gait stability and reduce risk for falls. Baseline: 13/30, 22/30 Goal status: REVISED  3.  Patient will demonstrate decreased fall risk by scoring < 12 sec on 5xSTS. Goal status: MET  4. Patient will demonstrate SLS >10 secs to decrease fall risk and improve balance.   Goal status: IN PROGRESS    5. Patient will be able to stand with narrow base for >10s and walk at least 10 tandem. steps   Goal status: INITIAL  ASSESSMENT:  CLINICAL IMPRESSION:  Patient arrived a bit late today. Treatmetn focused on SLS, balnce, and speed of movement. He participated very well and progressed through increasingly difficult challenges, requiring occasional min A with the more advanced activities.  OBJECTIVE IMPAIRMENTS Abnormal gait, decreased balance, decreased coordination, and decreased strength.   ACTIVITY LIMITATIONS transfers and locomotion level  PARTICIPATION LIMITATIONS: driving, community activity, and yard work  PERSONAL FACTORS 1-2 comorbidities: HTN, hx  of strokes  are also affecting patient's functional outcome.   REHAB POTENTIAL: Good  CLINICAL DECISION MAKING: Stable/uncomplicated  EVALUATION COMPLEXITY: Low  PLAN: PT FREQUENCY: 2x/week  PT DURATION: 8 weeks  PLANNED INTERVENTIONS: Therapeutic exercises, Therapeutic activity, Neuromuscular re-education, Balance training, Gait training, Patient/Family education, Joint mobilization, Stair training, Cryotherapy, Moist heat, Biofeedback, and Re-evaluation  PLAN FOR NEXT SESSION: Continue with progressive balance activities, cognitive dual tasking   Marcelina Morel,  DPT 06/19/2022, 1:16 PM

## 2022-06-21 ENCOUNTER — Ambulatory Visit: Payer: PPO | Admitting: Physical Therapy

## 2022-06-21 ENCOUNTER — Encounter: Payer: Self-pay | Admitting: Physical Medicine & Rehabilitation

## 2022-06-21 ENCOUNTER — Encounter: Payer: Self-pay | Admitting: Physical Therapy

## 2022-06-21 ENCOUNTER — Encounter: Payer: PPO | Attending: Registered Nurse | Admitting: Physical Medicine & Rehabilitation

## 2022-06-21 ENCOUNTER — Encounter (HOSPITAL_BASED_OUTPATIENT_CLINIC_OR_DEPARTMENT_OTHER): Payer: Self-pay | Admitting: Cardiology

## 2022-06-21 VITALS — BP 169/98 | HR 73 | Ht 72.0 in | Wt 206.8 lb

## 2022-06-21 DIAGNOSIS — I639 Cerebral infarction, unspecified: Secondary | ICD-10-CM | POA: Diagnosis not present

## 2022-06-21 DIAGNOSIS — I69351 Hemiplegia and hemiparesis following cerebral infarction affecting right dominant side: Secondary | ICD-10-CM | POA: Diagnosis not present

## 2022-06-21 DIAGNOSIS — Z8673 Personal history of transient ischemic attack (TIA), and cerebral infarction without residual deficits: Secondary | ICD-10-CM | POA: Insufficient documentation

## 2022-06-21 DIAGNOSIS — I6523 Occlusion and stenosis of bilateral carotid arteries: Secondary | ICD-10-CM | POA: Insufficient documentation

## 2022-06-21 DIAGNOSIS — E7800 Pure hypercholesterolemia, unspecified: Secondary | ICD-10-CM | POA: Insufficient documentation

## 2022-06-21 DIAGNOSIS — R2689 Other abnormalities of gait and mobility: Secondary | ICD-10-CM

## 2022-06-21 NOTE — Progress Notes (Signed)
Subjective:    Patient ID: Ivan Lawson, male    DOB: 1956/10/19, 66 y.o.   MRN: [id number] Admit date: 04/16/2022 Discharge date: 04/25/2022 MRI revealed left pontine stroke. Ivan Lawson was admitted to rehab 04/16/2022 for inpatient therapies to consist of PT, ST and OT at least three hours five days a week. Past admission physiatrist, therapy team and rehab RN have worked together to provide customized collaborative inpatient rehab. RUE strength and dysarthria improving daily since admission per patient. Constipation bothersome and was administered Senekot and sorbitol as needed. Lovenox discontinued 2/23 as patient is ambulatory. BUN and creatinine lower and potassium normal on 5/24. Pai and swelling lf right index finger treated with Voltaren gel. Must avoid NSAIDs. Received triamcinolone IM injection prior to discharge on 04/25/2022. HPI Finished OP OT Has had cont OP PT  Mod I for amb on uneven surfaces Released to driving by Neuro Pain Inventory Average Pain 1 Pain Right Now 0 My pain is dull  In the last 24 hours, has pain interfered with the following? General activity 0 Relation with others 0 Enjoyment of life 0 What TIME of day is your pain at its worst? morning  Sleep (in general) Fair  Pain is worse with: inactivity Pain improves with: pacing activities Relief from Meds: 3  Family History  Problem Relation Age of Onset   Thyroid disease Neg Hx    Social History   Socioeconomic History   Marital status: Single    Spouse name: Not on file   Number of children: Not on file   Years of education: Not on file   Highest education level: Not on file  Occupational History   Not on file  Tobacco Use   Smoking status: Never   Smokeless tobacco: Never  Vaping Use   Vaping Use: Never used  Substance and Sexual Activity   Alcohol use: Not Currently   Drug use: Not Currently   Sexual activity: Not on file  Other Topics Concern   Not on file  Social History  Narrative   Not on file   Social Determinants of Health   Financial Resource Strain: Low Risk  (06/06/2022)   Overall Financial Resource Strain (CARDIA)    Difficulty of Paying Living Expenses: Not hard at all  Food Insecurity: No Food Insecurity (06/06/2022)   Hunger Vital Sign    Worried About Running Out of Food in the Last Year: Never true    Ran Out of Food in the Last Year: Never true  Transportation Needs: No Transportation Needs (06/06/2022)   PRAPARE - Administrator, Civil Service (Medical): No    Lack of Transportation (Non-Medical): No  Physical Activity: Inactive (06/06/2022)   Exercise Vital Sign    Days of Exercise per Week: 0 days    Minutes of Exercise per Session: 0 min  Stress: Not on file  Social Connections: Not on file   Past Surgical History:  Procedure Laterality Date   BACK SURGERY  1990   FINGER SURGERY     Past Surgical History:  Procedure Laterality Date   BACK SURGERY  1990   FINGER SURGERY     Past Medical History:  Diagnosis Date   Hyperthyroidism    BP (!) 169/98   Pulse 73   Ht 6' (1.829 m)   Wt 206 lb 12.8 oz (93.8 kg)   SpO2 98%   BMI 28.05 kg/m   Opioid Risk Score:   Fall Risk Score:  `1  Depression screen [facility] 2/9     05/08/2022    1:09 PM  Depression screen PHQ 2/9  Decreased Interest 0  Down, Depressed, Hopeless 0  PHQ - 2 Score 0  Altered sleeping 0  Tired, decreased energy 0  Change in appetite 0  Feeling bad or failure about yourself  0  Trouble concentrating 0  Moving slowly or fidgety/restless 0  Suicidal thoughts 0  PHQ-9 Score 0     Review of Systems  Musculoskeletal:        Left hip pain   All other systems reviewed and are negative.     Objective:   Physical Exam Vitals and nursing note reviewed.  Constitutional:      Appearance: He is normal weight.  HENT:     Head: Normocephalic and atraumatic.  Eyes:     Extraocular Movements: Extraocular movements intact.     Conjunctiva/sclera:  Conjunctivae normal.     Pupils: Pupils are equal, round, and reactive to light.  Neurological:     Mental Status: He is alert. He is disoriented.     Comments: 5/5 in BUE Normal sensation BUR and BLE Amb without device, no toe drag or knee instability  Abnl Tandem gait   Psychiatric:        Mood and Affect: Mood normal.        Behavior: Behavior normal.   Neg Romberg        Assessment & Plan:    Left pontine infarct excellent recovery , mild balance deficit, but otherwise back to Mod I level F/u PM&R on prn basis

## 2022-06-21 NOTE — Therapy (Signed)
OUTPATIENT PHYSICAL THERAPY NEURO TREATMENT   Patient Name: Ivan Lawson MRN: [id number] DOB:May 29, 1956, 66 y.o., male Today's Date: 06/21/2022   PCP: Gaynelle Arabian REFERRING PROVIDER: Lauraine Rinne   PT End of Session - 06/21/22 0927     Visit Number 13    Date for PT Re-Evaluation 07/02/22    PT Start Time 0925    PT Stop Time 1010    PT Time Calculation (min) 45 min    Activity Tolerance Patient tolerated treatment well    Behavior During Therapy [facility] for tasks assessed/performed                        Past Medical History:  Diagnosis Date   Hyperthyroidism    Past Surgical History:  Procedure Laterality Date   [place]   FINGER SURGERY     Patient Active Problem List   Diagnosis Date Noted   Left pontine cerebrovascular accident ([place]) 04/16/2022   Basilar artery stenosis 04/13/2022   Stroke-like symptom 04/12/2022   Cerebral thrombosis with cerebral infarction 04/12/2022   Acute ischemic stroke ([place]) 04/06/2022   Sinus bradycardia 04/06/2022   Erythrocytosis 04/06/2022   Acquired hypothyroidism 04/06/2022   B12 deficiency 01/07/2018   Hypothyroidism following radioiodine therapy 09/25/2016   Elevated PSA 05/03/2016   Numbness 03/22/2016   HTN (hypertension) 11/22/2015   Bilateral hearing loss 11/22/2015   Tinnitus 11/22/2015   Progress Note Reporting Period 05/07/22 to 06/11/22  See note below for Objective Data and Assessment of Progress/Goals.     ONSET DATE: 04/06/22  REFERRING DIAG: I63.9  THERAPY DIAG:  Hemiplegia and hemiparesis following cerebral infarction affecting right dominant side (HCC)  Left pontine cerebrovascular accident ([place])  Other abnormalities of gait and mobility  Rationale for Evaluation and Treatment Rehabilitation  SUBJECTIVE:                                                                                                                                                                                               SUBJECTIVE STATEMENT: Doing okay, I was out walking in a field and looked up to see a plane and I almost lost my balance  Pt accompanied by: self  PERTINENT HISTORY: Acute L paramedian pons infarct w/ increased extent of involvement compared w/ 04/06/22  PMH includes gout that flared up last Wednesday, HTN, acquired hypothyroidism following radioactive iodine therapy for Graves' disease, and prediabetes  PAIN:  Are you having pain? Yes: NPRS scale: 0/10  PRECAUTIONS: Fall  WEIGHT BEARING RESTRICTIONS No  FALLS: Has patient fallen in last 6 months? No  LIVING  ENVIRONMENT: Lives with: lives alone Lives in: House/apartment Stairs: Yes: External: 5 steps; can reach both Has following equipment at home: Walker - 4 wheeled and Wheelchair (manual)  PLOF: Independent, retired  PATIENT GOALS be able to walk naturally, 10,000-12,000 steps a day again  OBJECTIVE:   UPPER EXTREMITY ROM: WFL UPPER EXTREMITY MMT: 4+ flexion R  LOWER EXTREMITY ROM:  [facility]  MMT Right Eval Left Eval Right 7/11 Left  7/11  Hip flexion 4 4+ 4+ 5  Hip extension      Hip abduction      Hip adduction      Hip internal rotation 4 4 4+ 4+  Hip external rotation 4 4 4+ 4+  Knee flexion 4- 4+ 4+ 5  Knee extension 4- 4+ 4+ 5  Ankle dorsiflexion '5 5 5 5  ' Ankle plantarflexion '5 5  5  ' Ankle inversion      Ankle eversion       (Blank rows = not tested)   GAIT: Gait pattern: decreased arm swing- Right, decreased step length- Right, decreased stance time- Right, decreased ankle dorsiflexion- Right, ataxic, trendelenburg, lateral lean- Left, and decreased trunk rotation Distance walked: 269f Assistive device utilized: None Level of assistance: Complete Independence Comments: Decreased step length, L shoulder height is elevated, favors LLE, trunk lean to L, trendelenberg , decreased speed  FUNCTIONAL TESTs:  5 times sit to stand: 12.55s, 9.20s  Timed up and go (TUG): 11.05s,  10.49s Functional gait assessment: 13/30 SLS L 2 secs at best R unable to do w/o support  4 stage balance (30s) EO firm EC firm min sway EO foam  EC foam CGA   TODAY'S TREATMENT:  06/21/22 Nustep level 5 x 6 minutes LE's only Gait around the back building good pace Walking ball toss On airex ball toss, head turns up and down as well, eyes closed Side stepping ball toss Step on and off airex side stepping Side stepping and tandem walk on the airex balance beam On airex high toe cone touch SLS cone touch with alternating hands On bosu balance ball toss Gave handout of advancing balance activities eyes open and closed , narrow BOS and on foam    06/19/22 Nu-Step L5 x 6 minutes. Progressive single limb challenges- stood in front of 6" step, alternating taps on steps, progressed to multiple taps with each foot, then tapping with rotation. 10 reps each B. Stood in front of 5 cones placed in semicircle on the floor-alternately tapping each cone then progressed to tapping 2 cones and then 3 cones in a row, including rotation across midline. He required occasional light steadying A. Monster walks with red Tband around ankles, 2 x 20', occasional min a for balance. Shuffling R and L, 2 x 20'. Patient was able to achieve and maintain a true shuffle, slightly slow. Jump switch with UUE support, lateral, then in stride-more difficulty with stride, but able to perform both and started to increase speed.  06/14/22 Bike L5 x581ms Backwards walking on treadmill  Tandem standing 30s each side  Tandem standing catching ball on firm  Mini squats on BOSU w/fingertip support Forwards and side lunges on BOSU x10 each side SL mini squats w/2HHA in // bars    PATIENT EDUCATION: Education details: POC, fall risk Person educated: Patient Education method: Explanation and Verbal cues Education comprehension: verbalized understanding and returned demonstration   HOME EXERCISE  PROGRAM: [id number]   GOALS: Goals reviewed with patient? Yes  SHORT TERM GOALS: Target date: 06/05/22  Patient will be independent with initial HEP. Goal status: IN PROGRESS  2.  Patient will be educated on strategies to decrease risk of falls.  Goal status: MET   LONG TERM GOALS: Target date: 07/02/22  Patient will be independent with advanced/ongoing HEP to improve outcomes and carryover.  Goal status: INITIAL  2.  Patient will demonstrate at least 25/30 on FGA to improve gait stability and reduce risk for falls. Baseline: 13/30, 22/30 Goal status: REVISED  3.  Patient will demonstrate decreased fall risk by scoring < 12 sec on 5xSTS. Goal status: MET  4. Patient will demonstrate SLS >10 secs to decrease fall risk and improve balance.   Goal status: IN PROGRESS    5. Patient will be able to stand with narrow base for >10s and walk at least 10 tandem. steps   Goal status: INITIAL  ASSESSMENT:  CLINICAL IMPRESSION:  Patient reported an issue while out walking looking up to see a plane, loss of blaance but a recovery, I added more balance activities with the head motions and the dynamic surfaces.  OBJECTIVE IMPAIRMENTS Abnormal gait, decreased balance, decreased coordination, and decreased strength.   ACTIVITY LIMITATIONS transfers and locomotion level  PARTICIPATION LIMITATIONS: driving, community activity, and yard work  PERSONAL FACTORS 1-2 comorbidities: HTN, hx of strokes  are also affecting patient's functional outcome.   REHAB POTENTIAL: Good  CLINICAL DECISION MAKING: Stable/uncomplicated  EVALUATION COMPLEXITY: Low  PLAN: PT FREQUENCY: 2x/week  PT DURATION: 8 weeks  PLANNED INTERVENTIONS: Therapeutic exercises, Therapeutic activity, Neuromuscular re-education, Balance training, Gait training, Patient/Family education, Joint mobilization, Stair training, Cryotherapy, Moist heat, Biofeedback, and Re-evaluation  PLAN FOR NEXT SESSION: Continue with  progressive balance activities, cognitive dual tasking   Lum Babe, PT 06/21/2022, 9:27 AM

## 2022-06-26 ENCOUNTER — Ambulatory Visit: Payer: PPO | Admitting: Physical Therapy

## 2022-06-26 ENCOUNTER — Telehealth: Payer: Self-pay | Admitting: Cardiology

## 2022-06-26 ENCOUNTER — Encounter: Payer: Self-pay | Admitting: Physical Therapy

## 2022-06-26 DIAGNOSIS — I69351 Hemiplegia and hemiparesis following cerebral infarction affecting right dominant side: Secondary | ICD-10-CM | POA: Diagnosis not present

## 2022-06-26 DIAGNOSIS — R2689 Other abnormalities of gait and mobility: Secondary | ICD-10-CM

## 2022-06-26 DIAGNOSIS — I639 Cerebral infarction, unspecified: Secondary | ICD-10-CM

## 2022-06-26 MED ORDER — AMLODIPINE BESYLATE 10 MG PO TABS: 10.0000 mg | ORAL_TABLET | Freq: Every day | ORAL | 3 refills | Status: DC

## 2022-06-26 NOTE — Therapy (Signed)
OUTPATIENT PHYSICAL THERAPY NEURO TREATMENT   Patient Name: Ivan Lawson MRN: [id number] DOB:1956/08/16, 66 y.o., male Today's Date: 06/26/2022   PCP: Gaynelle Arabian REFERRING PROVIDER: Lauraine Rinne   PT End of Session - 06/26/22 0847     Visit Number 14    Date for PT Re-Evaluation 07/02/22    PT Start Time 0843    PT Stop Time 0930    PT Time Calculation (min) 47 min    Activity Tolerance Patient tolerated treatment well    Behavior During Therapy [facility] for tasks assessed/performed                        Past Medical History:  Diagnosis Date   Hyperthyroidism    Past Surgical History:  Procedure Laterality Date   [place]   FINGER SURGERY     Patient Active Problem List   Diagnosis Date Noted   Bilateral carotid artery stenosis 06/21/2022   History of CVA (cerebrovascular accident) 06/21/2022   Pure hypercholesterolemia 06/21/2022   Left pontine cerebrovascular accident ([place]) 04/16/2022   Basilar artery stenosis 04/13/2022   Stroke-like symptom 04/12/2022   Cerebral thrombosis with cerebral infarction 04/12/2022   Acute ischemic stroke ([place]) 04/06/2022   Sinus bradycardia 04/06/2022   Erythrocytosis 04/06/2022   Acquired hypothyroidism 04/06/2022   B12 deficiency 01/07/2018   Hypothyroidism following radioiodine therapy 09/25/2016   Elevated PSA 05/03/2016   Numbness 03/22/2016   Essential hypertension 11/22/2015   Bilateral hearing loss 11/22/2015   Tinnitus 11/22/2015   Progress Note Reporting Period 05/07/22 to 06/11/22  See note below for Objective Data and Assessment of Progress/Goals.     ONSET DATE: 04/06/22  REFERRING DIAG: I63.9  THERAPY DIAG:  Hemiplegia and hemiparesis following cerebral infarction affecting right dominant side (HCC)  Left pontine cerebrovascular accident ([place])  Other abnormalities of gait and mobility  Rationale for Evaluation and Treatment Rehabilitation  SUBJECTIVE:                                                                                                                                                                                               SUBJECTIVE STATEMENT:  I have tried some of the new exercises I like them and think they helped  PERTINENT HISTORY: Acute L paramedian pons infarct w/ increased extent of involvement compared w/ 04/06/22  PMH includes gout that flared up last Wednesday, HTN, acquired hypothyroidism following radioactive iodine therapy for Graves' disease, and prediabetes  PAIN:  Are you having pain? Yes: NPRS scale: 0/10  PRECAUTIONS: Fall  WEIGHT BEARING RESTRICTIONS No  FALLS: Has  patient fallen in last 6 months? No  LIVING ENVIRONMENT: Lives with: lives alone Lives in: House/apartment Stairs: Yes: External: 5 steps; can reach both Has following equipment at home: Walker - 4 wheeled and Wheelchair (manual)  PLOF: Independent, retired  PATIENT GOALS be able to walk naturally, 10,000-12,000 steps a day again  OBJECTIVE:   UPPER EXTREMITY ROM: WFL UPPER EXTREMITY MMT: 4+ flexion R  LOWER EXTREMITY ROM:  [facility]  MMT Right Eval Left Eval Right 7/11 Left  7/11  Hip flexion 4 4+ 4+ 5  Hip extension      Hip abduction      Hip adduction      Hip internal rotation 4 4 4+ 4+  Hip external rotation 4 4 4+ 4+  Knee flexion 4- 4+ 4+ 5  Knee extension 4- 4+ 4+ 5  Ankle dorsiflexion '5 5 5 5  ' Ankle plantarflexion '5 5  5  ' Ankle inversion      Ankle eversion       (Blank rows = not tested)   GAIT: Gait pattern: decreased arm swing- Right, decreased step length- Right, decreased stance time- Right, decreased ankle dorsiflexion- Right, ataxic, trendelenburg, lateral lean- Left, and decreased trunk rotation Distance walked: 23f Assistive device utilized: None Level of assistance: Complete Independence Comments: Decreased step length, L shoulder height is elevated, favors LLE, trunk lean to L, trendelenberg , decreased  speed  FUNCTIONAL TESTs:  5 times sit to stand: 12.55s, 9.20s  Timed up and go (TUG): 11.05s, 10.49s Functional gait assessment: 13/30 SLS L 2 secs at best R unable to do w/o support  4 stage balance (30s) EO firm EC firm min sway EO foam  EC foam CGA   TODAY'S TREATMENT:  06/26/22 NuStep level 5 x 6 minutes LE only Gait around the building in the back good pace On airex ball toss, walking ball toss Resisted gait all directions 50# 20# farmer carry, overhead carry Tandem walk,  On bosu ball toss, direction changes   06/21/22 Nustep level 5 x 6 minutes LE's only Gait around the back building good pace Walking ball toss On airex ball toss, head turns up and down as well, eyes closed Side stepping ball toss Step on and off airex side stepping Side stepping and tandem walk on the airex balance beam On airex high toe cone touch SLS cone touch with alternating hands On bosu balance ball toss Gave handout of advancing balance activities eyes open and closed , narrow BOS and on foam    06/19/22 Nu-Step L5 x 6 minutes. Progressive single limb challenges- stood in front of 6" step, alternating taps on steps, progressed to multiple taps with each foot, then tapping with rotation. 10 reps each B. Stood in front of 5 cones placed in semicircle on the floor-alternately tapping each cone then progressed to tapping 2 cones and then 3 cones in a row, including rotation across midline. He required occasional light steadying A. Monster walks with red Tband around ankles, 2 x 20', occasional min a for balance. Shuffling R and L, 2 x 20'. Patient was able to achieve and maintain a true shuffle, slightly slow. Jump switch with UUE support, lateral, then in stride-more difficulty with stride, but able to perform both and started to increase speed.  06/14/22 Bike L5 x543ms Backwards walking on treadmill  Tandem standing 30s each side  Tandem standing catching ball on firm  Mini squats on  BOSU w/fingertip support Forwards and side lunges on BOSU x10 each side  SL mini squats w/2HHA in // bars    PATIENT EDUCATION: Education details: POC, fall risk Person educated: Patient Education method: Explanation and Verbal cues Education comprehension: verbalized understanding and returned demonstration   HOME EXERCISE PROGRAM: [id number]   GOALS: Goals reviewed with patient? Yes  SHORT TERM GOALS: Target date: 06/05/22  Patient will be independent with initial HEP. Goal status:  met  2.  Patient will be educated on strategies to decrease risk of falls.  Goal status: MET   LONG TERM GOALS: Target date: 07/02/22  Patient will be independent with advanced/ongoing HEP to improve outcomes and carryover.  Goal status: INITIAL  2.  Patient will demonstrate at least 25/30 on FGA to improve gait stability and reduce risk for falls. Baseline: 13/30, 22/30 Goal status: REVISED  3.  Patient will demonstrate decreased fall risk by scoring < 12 sec on 5xSTS. Goal status: MET  4. Patient will demonstrate SLS >10 secs to decrease fall risk and improve balance.   Goal status: IN PROGRESS    5. Patient will be able to stand with narrow base for >10s and walk at least 10 tandem. steps   Goal status: progressing  ASSESSMENT:  CLINICAL IMPRESSION:  Patient continues to progress the function and the balance, he had most issues with tandem type activities and on the dynamic surfaces.  I continue to try to advance his higher level activtieis, he is doing well but he does become frustrated at times OBJECTIVE IMPAIRMENTS Abnormal gait, decreased balance, decreased coordination, and decreased strength.   ACTIVITY LIMITATIONS transfers and locomotion level  PARTICIPATION LIMITATIONS: driving, community activity, and yard work  PERSONAL FACTORS 1-2 comorbidities: HTN, hx of strokes  are also affecting patient's functional outcome.   REHAB POTENTIAL: Good  CLINICAL DECISION MAKING:  Stable/uncomplicated  EVALUATION COMPLEXITY: Low  PLAN: PT FREQUENCY: 2x/week  PT DURATION: 8 weeks  PLANNED INTERVENTIONS: Therapeutic exercises, Therapeutic activity, Neuromuscular re-education, Balance training, Gait training, Patient/Family education, Joint mobilization, Stair training, Cryotherapy, Moist heat, Biofeedback, and Re-evaluation  PLAN FOR NEXT SESSION: Continue with progressive balance activities, cognitive dual tasking   Lum Babe, PT 06/26/2022, 8:48 AM

## 2022-06-26 NOTE — Telephone Encounter (Signed)
Pt c/o medication issue:  1. Name of Medication: amLODipine (NORVASC) 5 MG tablet  2. How are you currently taking this medication (dosage and times per day)? 2.5 mg 4 tablets daily per Dr. Cristal Deer   3. Are you having a reaction (difficulty breathing--STAT)? No  4. What is your medication issue? Pt states that he will be running out of this medication soon due to the increase in how many he is suppose to be taking and would like 10 mg amLODipine called into his pharmacy at [facility] until his next office visit.

## 2022-06-27 NOTE — Therapy (Signed)
OUTPATIENT PHYSICAL THERAPY NEURO TREATMENT   Patient Name: Ivan Lawson MRN: [id number] DOB:14-Nov-1956, 66 y.o., male Today's Date: 06/28/2022   PCP: Gaynelle Arabian REFERRING PROVIDER: Lauraine Rinne   PT End of Session - 06/28/22 0918     Visit Number 15    Date for PT Re-Evaluation 07/02/22    PT Start Time 0919    PT Stop Time 1003    PT Time Calculation (min) 44 min    Activity Tolerance Patient tolerated treatment well    Behavior During Therapy [facility] for tasks assessed/performed             Past Medical History:  Diagnosis Date   Hyperthyroidism    Past Surgical History:  Procedure Laterality Date   [place]   FINGER SURGERY     Patient Active Problem List   Diagnosis Date Noted   Bilateral carotid artery stenosis 06/21/2022   History of CVA (cerebrovascular accident) 06/21/2022   Pure hypercholesterolemia 06/21/2022   Left pontine cerebrovascular accident ([place]) 04/16/2022   Basilar artery stenosis 04/13/2022   Stroke-like symptom 04/12/2022   Cerebral thrombosis with cerebral infarction 04/12/2022   Acute ischemic stroke ([place]) 04/06/2022   Sinus bradycardia 04/06/2022   Erythrocytosis 04/06/2022   Acquired hypothyroidism 04/06/2022   B12 deficiency 01/07/2018   Hypothyroidism following radioiodine therapy 09/25/2016   Elevated PSA 05/03/2016   Numbness 03/22/2016   Essential hypertension 11/22/2015   Bilateral hearing loss 11/22/2015   Tinnitus 11/22/2015      ONSET DATE: 04/06/22  REFERRING DIAG: I63.9  THERAPY DIAG:  Hemiplegia and hemiparesis following cerebral infarction affecting right dominant side (HCC)  Left pontine cerebrovascular accident ([place])  Other abnormalities of gait and mobility  Other lack of coordination  Rationale for Evaluation and Treatment Rehabilitation  SUBJECTIVE:                                                                                                                                                                                               SUBJECTIVE STATEMENT:  Doing fine, have been sweating from being outside all morning.   PERTINENT HISTORY: Acute L paramedian pons infarct w/ increased extent of involvement compared w/ 04/06/22  PMH includes gout that flared up last Wednesday, HTN, acquired hypothyroidism following radioactive iodine therapy for Graves' disease, and prediabetes  PAIN:  Are you having pain? Yes: NPRS scale: 0/10  PRECAUTIONS: Fall  WEIGHT BEARING RESTRICTIONS No  FALLS: Has patient fallen in last 6 months? No  LIVING ENVIRONMENT: Lives with: lives alone Lives in: House/apartment Stairs: Yes: External: 5 steps; can reach both Has following equipment at  home: Environmental consultant - 4 wheeled and Wheelchair (manual)  PLOF: Independent, retired  PATIENT GOALS be able to walk naturally, 10,000-12,000 steps a day again  OBJECTIVE:   UPPER EXTREMITY ROM: WFL UPPER EXTREMITY MMT: 4+ flexion R  LOWER EXTREMITY ROM:  [facility]  MMT Right Eval Left Eval Right 7/11 Left  7/11  Hip flexion 4 4+ 4+ 5  Hip extension      Hip abduction      Hip adduction      Hip internal rotation 4 4 4+ 4+  Hip external rotation 4 4 4+ 4+  Knee flexion 4- 4+ 4+ 5  Knee extension 4- 4+ 4+ 5  Ankle dorsiflexion '5 5 5 5  ' Ankle plantarflexion '5 5  5  ' Ankle inversion      Ankle eversion       (Blank rows = not tested)   GAIT: Gait pattern: decreased arm swing- Right, decreased step length- Right, decreased stance time- Right, decreased ankle dorsiflexion- Right, ataxic, trendelenburg, lateral lean- Left, and decreased trunk rotation Distance walked: 259f Assistive device utilized: None Level of assistance: Complete Independence Comments: Decreased step length, L shoulder height is elevated, favors LLE, trunk lean to L, trendelenberg , decreased speed  FUNCTIONAL TESTs:  5 times sit to stand: 12.55s, 9.20s  Timed up and go (TUG): 11.05s, 10.49s Functional gait assessment:  13/30 SLS L 2 secs at best R unable to do w/o support  4 stage balance (30s) EO firm EC firm min sway EO foam  EC foam CGA   TODAY'S TREATMENT:  06/28/22 Elliptical L3 x537ms  Step over onto airex  On airex hitting ball  Feet together   Half tandem Leg press 60# x10, x10 RLE eccentric, RLE only 40# x10 Mini squats on BOSU 2x10 Reaching for cones with rotation on BOSU  Orange, blue, yellow, green, black   06/26/22 NuStep level 5 x 6 minutes LE only Gait around the building in the back good pace On airex ball toss, walking ball toss Resisted gait all directions 50# 20# farmer carry, overhead carry Tandem walk,  On bosu ball toss, direction changes   06/21/22 Nustep level 5 x 6 minutes LE's only Gait around the back building good pace Walking ball toss On airex ball toss, head turns up and down as well, eyes closed Side stepping ball toss Step on and off airex side stepping Side stepping and tandem walk on the airex balance beam On airex high toe cone touch SLS cone touch with alternating hands On bosu balance ball toss Gave handout of advancing balance activities eyes open and closed , narrow BOS and on foam    06/19/22 Nu-Step L5 x 6 minutes. Progressive single limb challenges- stood in front of 6" step, alternating taps on steps, progressed to multiple taps with each foot, then tapping with rotation. 10 reps each B. Stood in front of 5 cones placed in semicircle on the floor-alternately tapping each cone then progressed to tapping 2 cones and then 3 cones in a row, including rotation across midline. He required occasional light steadying A. Monster walks with red Tband around ankles, 2 x 20', occasional min a for balance. Shuffling R and L, 2 x 20'. Patient was able to achieve and maintain a true shuffle, slightly slow. Jump switch with UUE support, lateral, then in stride-more difficulty with stride, but able to perform both and started to increase  speed.  06/14/22 Bike L5 x5m24m Backwards walking on treadmill  Tandem standing 30s each side  Tandem standing catching ball on firm  Mini squats on BOSU w/fingertip support Forwards and side lunges on BOSU x10 each side SL mini squats w/2HHA in // bars    PATIENT EDUCATION: Education details: POC, fall risk Person educated: Patient Education method: Explanation and Verbal cues Education comprehension: verbalized understanding and returned demonstration   HOME EXERCISE PROGRAM: [id number]   GOALS: Goals reviewed with patient? Yes  SHORT TERM GOALS: Target date: 06/05/22  Patient will be independent with initial HEP. Goal status:  met  2.  Patient will be educated on strategies to decrease risk of falls.  Goal status: MET   LONG TERM GOALS: Target date: 07/02/22  Patient will be independent with advanced/ongoing HEP to improve outcomes and carryover.  Goal status: INITIAL  2.  Patient will demonstrate at least 25/30 on FGA to improve gait stability and reduce risk for falls. Baseline: 13/30, 22/30 Goal status: REVISED  3.  Patient will demonstrate decreased fall risk by scoring < 12 sec on 5xSTS. Goal status: MET  4. Patient will demonstrate SLS >10 secs to decrease fall risk and improve balance.   Goal status: IN PROGRESS    5. Patient will be able to stand with narrow base for >10s and walk at least 10 tandem. steps   Goal status: progressing  ASSESSMENT:  CLINICAL IMPRESSION:  He is able to do mini squats on BOSU with just occasional HHA. Patient does well reaching side ways on BOSU for cones, added in cognitive component memorizing colors. Requires more time with dual tasking. He continues to show good demonstration with high level activities balance.    OBJECTIVE IMPAIRMENTS Abnormal gait, decreased balance, decreased coordination, and decreased strength.   ACTIVITY LIMITATIONS transfers and locomotion level  PARTICIPATION LIMITATIONS: driving, community  activity, and yard work  PERSONAL FACTORS 1-2 comorbidities: HTN, hx of strokes  are also affecting patient's functional outcome.   REHAB POTENTIAL: Good  CLINICAL DECISION MAKING: Stable/uncomplicated  EVALUATION COMPLEXITY: Low  PLAN: PT FREQUENCY: 2x/week  PT DURATION: 8 weeks  PLANNED INTERVENTIONS: Therapeutic exercises, Therapeutic activity, Neuromuscular re-education, Balance training, Gait training, Patient/Family education, Joint mobilization, Stair training, Cryotherapy, Moist heat, Biofeedback, and Re-evaluation  PLAN FOR NEXT SESSION: Continue with progressive balance activities, cognitive dual tasking   Andris Baumann, DPT 06/28/2022, 10:08 AM

## 2022-06-28 ENCOUNTER — Ambulatory Visit: Payer: PPO

## 2022-06-28 DIAGNOSIS — R278 Other lack of coordination: Secondary | ICD-10-CM

## 2022-06-28 DIAGNOSIS — I69351 Hemiplegia and hemiparesis following cerebral infarction affecting right dominant side: Secondary | ICD-10-CM | POA: Diagnosis not present

## 2022-06-28 DIAGNOSIS — I639 Cerebral infarction, unspecified: Secondary | ICD-10-CM

## 2022-06-28 DIAGNOSIS — R2689 Other abnormalities of gait and mobility: Secondary | ICD-10-CM

## 2022-07-01 ENCOUNTER — Telehealth: Payer: Self-pay | Admitting: Cardiology

## 2022-07-01 MED ORDER — AMLODIPINE BESYLATE 10 MG PO TABS: 10.0000 mg | ORAL_TABLET | Freq: Every day | ORAL | 3 refills | Status: DC

## 2022-07-01 NOTE — Telephone Encounter (Signed)
Rx(s) sent to pharmacy electronically.  

## 2022-07-01 NOTE — Telephone Encounter (Signed)
 *  STAT* If patient is at the pharmacy, call can be transferred to refill team.   1. Which medications need to be refilled? (please list name of each medication and dose if known)   amLODipine (NORVASC) 10 MG tablet  2. Which pharmacy/location (including street and city if local pharmacy) is medication to be sent to?  [id number] - [place] ([place]), [place] - [address]  3. Do they need a 30 day or 90 day supply? 90 day 

## 2022-07-02 ENCOUNTER — Telehealth (HOSPITAL_BASED_OUTPATIENT_CLINIC_OR_DEPARTMENT_OTHER): Payer: Self-pay | Admitting: Cardiology

## 2022-07-02 ENCOUNTER — Ambulatory Visit: Payer: PPO | Attending: Family Medicine

## 2022-07-02 DIAGNOSIS — R278 Other lack of coordination: Secondary | ICD-10-CM | POA: Insufficient documentation

## 2022-07-02 DIAGNOSIS — I69351 Hemiplegia and hemiparesis following cerebral infarction affecting right dominant side: Secondary | ICD-10-CM | POA: Insufficient documentation

## 2022-07-02 DIAGNOSIS — R2689 Other abnormalities of gait and mobility: Secondary | ICD-10-CM | POA: Insufficient documentation

## 2022-07-02 DIAGNOSIS — I639 Cerebral infarction, unspecified: Secondary | ICD-10-CM | POA: Insufficient documentation

## 2022-07-02 MED ORDER — ROSUVASTATIN CALCIUM 20 MG PO TABS
20.0000 mg | ORAL_TABLET | Freq: Every day | ORAL | 3 refills | Status: DC
Start: 2022-07-02 — End: 2023-06-16

## 2022-07-02 NOTE — Telephone Encounter (Signed)
Rx request sent to pharmacy.  

## 2022-07-02 NOTE — Therapy (Signed)
OUTPATIENT PHYSICAL THERAPY NEURO TREATMENT   Patient Name: Ivan Lawson MRN: [id number] DOB:11/21/1956, 66 y.o., male Today's Date: 07/02/2022   PCP: Robert Ehinger REFERRING PROVIDER: Daniel Angiulli     Past Medical History:  Diagnosis Date   Hyperthyroidism    Past Surgical History:  Procedure Laterality Date   BACK SURGERY  1990   FINGER SURGERY     Patient Active Problem List   Diagnosis Date Noted   Bilateral carotid artery stenosis 06/21/2022   History of CVA (cerebrovascular accident) 06/21/2022   Pure hypercholesterolemia 06/21/2022   Left pontine cerebrovascular accident (HCC) 04/16/2022   Basilar artery stenosis 04/13/2022   Stroke-like symptom 04/12/2022   Cerebral thrombosis with cerebral infarction 04/12/2022   Acute ischemic stroke (HCC) 04/06/2022   Sinus bradycardia 04/06/2022   Erythrocytosis 04/06/2022   Acquired hypothyroidism 04/06/2022   B12 deficiency 01/07/2018   Hypothyroidism following radioiodine therapy 09/25/2016   Elevated PSA 05/03/2016   Numbness 03/22/2016   Essential hypertension 11/22/2015   Bilateral hearing loss 11/22/2015   Tinnitus 11/22/2015      ONSET DATE: 04/06/22  REFERRING DIAG: I63.9  THERAPY DIAG:  No diagnosis found.  Rationale for Evaluation and Treatment Rehabilitation  SUBJECTIVE:                                                                                                                                                                                              SUBJECTIVE STATEMENT:  Doing fine, have been sweating from being outside all morning.   PERTINENT HISTORY: Acute L paramedian pons infarct w/ increased extent of involvement compared w/ 04/06/22  PMH includes gout that flared up last Wednesday, HTN, acquired hypothyroidism following radioactive iodine therapy for Graves' disease, and prediabetes  PAIN:  Are you having pain? Yes: NPRS scale: 0/10  PRECAUTIONS: Fall  WEIGHT BEARING  RESTRICTIONS No  FALLS: Has patient fallen in last 6 months? No  LIVING ENVIRONMENT: Lives with: lives alone Lives in: House/apartment Stairs: Yes: External: 5 steps; can reach both Has following equipment at home: Walker - 4 wheeled and Wheelchair (manual)  PLOF: Independent, retired  PATIENT GOALS be able to walk naturally, 10,000-12,000 steps a day again  OBJECTIVE:   UPPER EXTREMITY ROM: WFL UPPER EXTREMITY MMT: 4+ flexion R  LOWER EXTREMITY ROM:  WFL  MMT Right Eval Left Eval Right 7/11 Left  7/11  Hip flexion 4 4+ 4+ 5  Hip extension      Hip abduction      Hip adduction      Hip internal rotation 4 4 4+ 4+  Hip external rotation 4 4 4+ 4+    Knee flexion 4- 4+ 4+ 5  Knee extension 4- 4+ 4+ 5  Ankle dorsiflexion 5 5 5 5  Ankle plantarflexion 5 5  5  Ankle inversion      Ankle eversion       (Blank rows = not tested)   GAIT: Gait pattern: decreased arm swing- Right, decreased step length- Right, decreased stance time- Right, decreased ankle dorsiflexion- Right, ataxic, trendelenburg, lateral lean- Left, and decreased trunk rotation Distance walked: 250ft Assistive device utilized: None Level of assistance: Complete Independence Comments: Decreased step length, L shoulder height is elevated, favors LLE, trunk lean to L, trendelenberg , decreased speed  FUNCTIONAL TESTs:  5 times sit to stand: 12.55s, 9.20s  Timed up and go (TUG): 11.05s, 10.49s Functional gait assessment: 13/30 SLS L 2 secs at best R unable to do w/o support  4 stage balance (30s) EO firm EC firm min sway EO foam  EC foam CGA   TODAY'S TREATMENT:  07/02/22 Gait around building and in grass  Resisted gait 30# forwards, backwards and stepping over obstacles  Standing on airex SL tapping cones w/1HHA Tandem walking  STS w/OHP 10# bilat 2x5   06/28/22 Elliptical L3 x5mins  Step over onto airex  On airex hitting ball  Feet together   Half tandem Leg press 60# x10, x10 RLE  eccentric, RLE only 40# x10 Mini squats on BOSU 2x10 Reaching for cones with rotation on BOSU- Orange, blue, yellow, green, black   06/26/22 NuStep level 5 x 6 minutes LE only Gait around the building in the back good pace On airex ball toss, walking ball toss Resisted gait all directions 50# 20# farmer carry, overhead carry Tandem walk,  On bosu ball toss, direction changes   06/21/22 Nustep level 5 x 6 minutes LE's only Gait around the back building good pace Walking ball toss On airex ball toss, head turns up and down as well, eyes closed Side stepping ball toss Step on and off airex side stepping Side stepping and tandem walk on the airex balance beam On airex high toe cone touch SLS cone touch with alternating hands On bosu balance ball toss Gave handout of advancing balance activities eyes open and closed , narrow BOS and on foam    06/19/22 Nu-Step L5 x 6 minutes. Progressive single limb challenges- stood in front of 6" step, alternating taps on steps, progressed to multiple taps with each foot, then tapping with rotation. 10 reps each B. Stood in front of 5 cones placed in semicircle on the floor-alternately tapping each cone then progressed to tapping 2 cones and then 3 cones in a row, including rotation across midline. He required occasional light steadying A. Monster walks with red Tband around ankles, 2 x 20', occasional min a for balance. Shuffling R and L, 2 x 20'. Patient was able to achieve and maintain a true shuffle, slightly slow. Jump switch with UUE support, lateral, then in stride-more difficulty with stride, but able to perform both and started to increase speed.  06/14/22 Bike L5 x5mins Backwards walking on treadmill  Tandem standing 30s each side  Tandem standing catching ball on firm  Mini squats on BOSU w/fingertip support Forwards and side lunges on BOSU x10 each side SL mini squats w/2HHA in // bars    PATIENT EDUCATION: Education details: POC,  fall risk Person educated: Patient Education method: Explanation and Verbal cues Education comprehension: verbalized understanding and returned demonstration   HOME EXERCISE PROGRAM: [id number]   GOALS: Goals reviewed   with patient? Yes  SHORT TERM GOALS: Target date: 06/05/22  Patient will be independent with initial HEP. Goal status:  met  2.  Patient will be educated on strategies to decrease risk of falls.  Goal status: MET   LONG TERM GOALS: Target date: 07/02/22  Patient will be independent with advanced/ongoing HEP to improve outcomes and carryover.  Goal status: INITIAL  2.  Patient will demonstrate at least 25/30 on FGA to improve gait stability and reduce risk for falls. Baseline: 13/30, 22/30 Goal status: REVISED  3.  Patient will demonstrate decreased fall risk by scoring < 12 sec on 5xSTS. Goal status: MET  4. Patient will demonstrate SLS >10 secs to decrease fall risk and improve balance.   Goal status: MET    5. Patient will be able to stand with narrow base for >10s and walk at least 10 tandem steps    07/02/22- 7s on R, 12s on L, 8steps at best for tandem walking  Goal status: progressing  ASSESSMENT:  CLINICAL IMPRESSION:  Walking outdoors on uneven terrain, forwards, backwards and side steps. He demonstrates good balance and steady gait. Single leg balance on airex requires 1HHA.   Patient has met almost all of his goals and is progressing to work towards remaining balance goals.   OBJECTIVE IMPAIRMENTS Abnormal gait, decreased balance, decreased coordination, and decreased strength.   ACTIVITY LIMITATIONS transfers and locomotion level  PARTICIPATION LIMITATIONS: driving, community activity, and yard work  PERSONAL FACTORS 1-2 comorbidities: HTN, hx of strokes  are also affecting patient's functional outcome.   REHAB POTENTIAL: Good  CLINICAL DECISION MAKING: Stable/uncomplicated  EVALUATION COMPLEXITY: Low  PLAN: PT FREQUENCY: 2x/week  PT  DURATION: 8 weeks  PLANNED INTERVENTIONS: Therapeutic exercises, Therapeutic activity, Neuromuscular re-education, Balance training, Gait training, Patient/Family education, Joint mobilization, Stair training, Cryotherapy, Moist heat, Biofeedback, and Re-evaluation  PLAN FOR NEXT SESSION: Continue with progressive balance activities, cognitive dual tasking   Andris Baumann, DPT 07/02/2022, 9:18 AM

## 2022-07-02 NOTE — Telephone Encounter (Signed)
*  STAT* If patient is at the pharmacy, call can be transferred to refill team.   1. Which medications need to be refilled? (please list name of each medication and dose if known) need a new prescription for Rosuvastatin  2. Which pharmacy/location (including street and city if local pharmacy) is medication to be sent to?[facility] [address], [place]  3. Do they need a 30 day or 90 day supply? 90 days and refill- need asap please- been without for 12 days

## 2022-07-03 ENCOUNTER — Ambulatory Visit: Payer: PPO | Admitting: Physical Therapy

## 2022-07-05 ENCOUNTER — Ambulatory Visit: Payer: PPO

## 2022-07-05 DIAGNOSIS — R278 Other lack of coordination: Secondary | ICD-10-CM

## 2022-07-05 DIAGNOSIS — R2689 Other abnormalities of gait and mobility: Secondary | ICD-10-CM

## 2022-07-05 DIAGNOSIS — I639 Cerebral infarction, unspecified: Secondary | ICD-10-CM

## 2022-07-05 DIAGNOSIS — I69351 Hemiplegia and hemiparesis following cerebral infarction affecting right dominant side: Secondary | ICD-10-CM

## 2022-07-05 NOTE — Therapy (Signed)
OUTPATIENT PHYSICAL THERAPY NEURO TREATMENT   Patient Name: Ivan Lawson MRN: [id number] DOB:06/15/1956, 66 y.o., male Today's Date: 07/05/2022   PCP: Gaynelle Arabian REFERRING PROVIDER: Lauraine Rinne     Past Medical History:  Diagnosis Date   Hyperthyroidism    Past Surgical History:  Procedure Laterality Date   BACK SURGERY  1990   FINGER SURGERY     Patient Active Problem List   Diagnosis Date Noted   Bilateral carotid artery stenosis 06/21/2022   History of CVA (cerebrovascular accident) 06/21/2022   Pure hypercholesterolemia 06/21/2022   Left pontine cerebrovascular accident ([place]) 04/16/2022   Basilar artery stenosis 04/13/2022   Stroke-like symptom 04/12/2022   Cerebral thrombosis with cerebral infarction 04/12/2022   Acute ischemic stroke ([place]) 04/06/2022   Sinus bradycardia 04/06/2022   Erythrocytosis 04/06/2022   Acquired hypothyroidism 04/06/2022   B12 deficiency 01/07/2018   Hypothyroidism following radioiodine therapy 09/25/2016   Elevated PSA 05/03/2016   Numbness 03/22/2016   Essential hypertension 11/22/2015   Bilateral hearing loss 11/22/2015   Tinnitus 11/22/2015      ONSET DATE: 04/06/22  REFERRING DIAG: I63.9  THERAPY DIAG:  No diagnosis found.  Rationale for Evaluation and Treatment Rehabilitation  SUBJECTIVE:                                                                                                                                                                                              SUBJECTIVE STATEMENT: I am particularly cheerful today for whatever reason, everything is peachy.   PERTINENT HISTORY: Acute L paramedian pons infarct w/ increased extent of involvement compared w/ 04/06/22  PMH includes gout that flared up last Wednesday, HTN, acquired hypothyroidism following radioactive iodine therapy for Graves' disease, and prediabetes  PAIN:  Are you having pain? Yes: NPRS scale: 0/10  PRECAUTIONS: Fall  WEIGHT  BEARING RESTRICTIONS No  FALLS: Has patient fallen in last 6 months? No  LIVING ENVIRONMENT: Lives with: lives alone Lives in: House/apartment Stairs: Yes: External: 5 steps; can reach both Has following equipment at home: Walker - 4 wheeled and Wheelchair (manual)  PLOF: Independent, retired  PATIENT GOALS be able to walk naturally, 10,000-12,000 steps a day again  OBJECTIVE:   UPPER EXTREMITY ROM: WFL UPPER EXTREMITY MMT: 4+ flexion R  LOWER EXTREMITY ROM:  [facility]  MMT Right Eval Left Eval Right 7/11 Left  7/11  Hip flexion 4 4+ 4+ 5  Hip extension      Hip abduction      Hip adduction      Hip internal rotation 4 4 4+ 4+  Hip external rotation 4 4 4+ 4+  Knee flexion 4- 4+ 4+ 5  Knee extension 4- 4+ 4+ 5  Ankle dorsiflexion '5 5 5 5  ' Ankle plantarflexion '5 5  5  ' Ankle inversion      Ankle eversion       (Blank rows = not tested)   GAIT: Gait pattern: decreased arm swing- Right, decreased step length- Right, decreased stance time- Right, decreased ankle dorsiflexion- Right, ataxic, trendelenburg, lateral lean- Left, and decreased trunk rotation Distance walked: 243f Assistive device utilized: None Level of assistance: Complete Independence Comments: Decreased step length, L shoulder height is elevated, favors LLE, trunk lean to L, trendelenberg , decreased speed  FUNCTIONAL TESTs:  5 times sit to stand: 12.55s, 9.20s  Timed up and go (TUG): 11.05s, 10.49s Functional gait assessment: 13/30 SLS L 2 secs at best R unable to do w/o support  4 stage balance (30s) EO firm EC firm min sway EO foam  EC foam CGA   TODAY'S TREATMENT:  07/05/22 Elliptical L4 x550ms  Tandem standing on balance beam catching ball Side steps on balance beam catching  SL RDL w/1HHA 5# x10 bilat Stretching SK2C, HS 30s bilat   Knee ext 65# x10, RLE only 45# x10 Knee flex 55# x10, RLE only 35# x10   07/02/22 Gait around building and in grass  Resisted gait 30# forwards, backwards  and stepping over obstacles  Standing on airex SL tapping cones w/1HHA Tandem walking  STS w/OHP 10# bilat 2x5   06/28/22 Elliptical L3 x5m63m  Step over onto airex  On airex hitting ball  Feet together   Half tandem Leg press 60# x10, x10 RLE eccentric, RLE only 40# x10 Mini squats on BOSU 2x10 Reaching for cones with rotation on BOSU- Orange, blue, yellow, green, black   06/26/22 NuStep level 5 x 6 minutes LE only Gait around the building in the back good pace On airex ball toss, walking ball toss Resisted gait all directions 50# 20# farmer carry, overhead carry Tandem walk,  On bosu ball toss, direction changes   06/21/22 Nustep level 5 x 6 minutes LE's only Gait around the back building good pace Walking ball toss On airex ball toss, head turns up and down as well, eyes closed Side stepping ball toss Step on and off airex side stepping Side stepping and tandem walk on the airex balance beam On airex high toe cone touch SLS cone touch with alternating hands On bosu balance ball toss Gave handout of advancing balance activities eyes open and closed , narrow BOS and on foam    06/19/22 Nu-Step L5 x 6 minutes. Progressive single limb challenges- stood in front of 6" step, alternating taps on steps, progressed to multiple taps with each foot, then tapping with rotation. 10 reps each B. Stood in front of 5 cones placed in semicircle on the floor-alternately tapping each cone then progressed to tapping 2 cones and then 3 cones in a row, including rotation across midline. He required occasional light steadying A. Monster walks with red Tband around ankles, 2 x 20', occasional min a for balance. Shuffling R and L, 2 x 20'. Patient was able to achieve and maintain a true shuffle, slightly slow. Jump switch with UUE support, lateral, then in stride-more difficulty with stride, but able to perform both and started to increase speed.  06/14/22 Bike L5 x5mi35mBackwards walking on  treadmill  Tandem standing 30s each side  Tandem standing catching ball on firm  Mini squats on BOSU w/fingertip support Forwards and side  lunges on BOSU x10 each side SL mini squats w/2HHA in // bars    PATIENT EDUCATION: Education details: POC, fall risk Person educated: Patient Education method: Explanation and Verbal cues Education comprehension: verbalized understanding and returned demonstration   HOME EXERCISE PROGRAM: [id number]   GOALS: Goals reviewed with patient? Yes  SHORT TERM GOALS: Target date: 06/05/22  Patient will be independent with initial HEP. Goal status:  met  2.  Patient will be educated on strategies to decrease risk of falls.  Goal status: MET   LONG TERM GOALS: Target date: 07/02/22  Patient will be independent with advanced/ongoing HEP to improve outcomes and carryover.  Goal status: INITIAL  2.  Patient will demonstrate at least 25/30 on FGA to improve gait stability and reduce risk for falls. Baseline: 13/30, 22/30 Goal status: IN PROGRESS  3.  Patient will demonstrate decreased fall risk by scoring < 12 sec on 5xSTS. Goal status: MET  4. Patient will demonstrate SLS >10 secs to decrease fall risk and improve balance.   Goal status: MET    5. Patient will be able to stand with narrow base for >10s and walk at least 10 tandem steps    07/02/22- 7s on R, 12s on L, 8steps at best for tandem walking  Goal status: progressing  ASSESSMENT:  CLINICAL IMPRESSION:  Patient gets frustrated with balance on beam and tends to lose balance towards the right. He is progressively getting better with each visit and more steady on his feet. Does better with side steps on beam, less unsteadiness. Still having the most difficulty with single leg activities. Able to increase weights for knee exercises and demonstrates great improvements in strength.   OBJECTIVE IMPAIRMENTS Abnormal gait, decreased balance, decreased coordination, and decreased strength.    ACTIVITY LIMITATIONS transfers and locomotion level  PARTICIPATION LIMITATIONS: driving, community activity, and yard work  PERSONAL FACTORS 1-2 comorbidities: HTN, hx of strokes  are also affecting patient's functional outcome.   REHAB POTENTIAL: Good  CLINICAL DECISION MAKING: Stable/uncomplicated  EVALUATION COMPLEXITY: Low  PLAN: PT FREQUENCY: 2x/week  PT DURATION: 8 weeks  PLANNED INTERVENTIONS: Therapeutic exercises, Therapeutic activity, Neuromuscular re-education, Balance training, Gait training, Patient/Family education, Joint mobilization, Stair training, Cryotherapy, Moist heat, Biofeedback, and Re-evaluation  PLAN FOR NEXT SESSION: Continue with progressive balance activities, cognitive dual tasking, bird dogs    Andris Baumann, DPT 07/05/2022, 7:56 AM

## 2022-07-08 NOTE — Therapy (Signed)
OUTPATIENT PHYSICAL THERAPY NEURO TREATMENT   Patient Name: Ivan Lawson MRN: [id number] DOB:[birth year], 66 y.o., male Today's Date: 07/09/2022   PCP: Gaynelle Arabian REFERRING PROVIDER: Lauraine Rinne   PT End of Session - 07/09/22 1231     Visit Number 18    Date for PT Re-Evaluation 07/18/22    PT Start Time 34    PT Stop Time 1315    PT Time Calculation (min) 45 min    Activity Tolerance Patient tolerated treatment well    Behavior During Therapy [facility] for tasks assessed/performed              Past Medical History:  Diagnosis Date   Hyperthyroidism    Past Surgical History:  Procedure Laterality Date   [place]   FINGER SURGERY     Patient Active Problem List   Diagnosis Date Noted   Bilateral carotid artery stenosis 06/21/2022   History of CVA (cerebrovascular accident) 06/21/2022   Pure hypercholesterolemia 06/21/2022   Left pontine cerebrovascular accident ([place]) 04/16/2022   Basilar artery stenosis 04/13/2022   Stroke-like symptom 04/12/2022   Cerebral thrombosis with cerebral infarction 04/12/2022   Acute ischemic stroke ([place]) 04/06/2022   Sinus bradycardia 04/06/2022   Erythrocytosis 04/06/2022   Acquired hypothyroidism 04/06/2022   B12 deficiency 01/07/2018   Hypothyroidism following radioiodine therapy 09/25/2016   Elevated PSA 05/03/2016   Numbness 03/22/2016   Essential hypertension 11/22/2015   Bilateral hearing loss 11/22/2015   Tinnitus 11/22/2015      ONSET DATE: 04/06/22  REFERRING DIAG: I63.9  THERAPY DIAG:  Other abnormalities of gait and mobility  Other lack of coordination  Hemiplegia and hemiparesis following cerebral infarction affecting right dominant side (HCC)  Left pontine cerebrovascular accident [facility])  Rationale for Evaluation and Treatment Rehabilitation  SUBJECTIVE:                                                                                                                                                                                               SUBJECTIVE STATEMENT: Doing alright, I am getting better. I feel like I am 95% better, but the last 5% may be tough.   PERTINENT HISTORY: Acute L paramedian pons infarct w/ increased extent of involvement compared w/ 04/06/22  PMH includes gout that flared up last Wednesday, HTN, acquired hypothyroidism following radioactive iodine therapy for Graves' disease, and prediabetes  PAIN:  Are you having pain? Yes: NPRS scale: 0/10  PRECAUTIONS: Fall  WEIGHT BEARING RESTRICTIONS No  FALLS: Has patient fallen in last 6 months? No  LIVING ENVIRONMENT: Lives with: lives alone Lives in: House/apartment Stairs: Yes:  External: 5 steps; can reach both Has following equipment at home: Walker - 4 wheeled and Wheelchair (manual)  PLOF: Independent, retired  PATIENT GOALS be able to walk naturally, 10,000-12,000 steps a day again  OBJECTIVE:   UPPER EXTREMITY ROM: WFL UPPER EXTREMITY MMT: 4+ flexion R  LOWER EXTREMITY ROM:  [facility]  MMT Right Eval Left Eval Right 7/11 Left  7/11  Hip flexion 4 4+ 4+ 5  Hip extension      Hip abduction      Hip adduction      Hip internal rotation 4 4 4+ 4+  Hip external rotation 4 4 4+ 4+  Knee flexion 4- 4+ 4+ 5  Knee extension 4- 4+ 4+ 5  Ankle dorsiflexion '5 5 5 5  ' Ankle plantarflexion '5 5  5  ' Ankle inversion      Ankle eversion       (Blank rows = not tested)   GAIT: Gait pattern: decreased arm swing- Right, decreased step length- Right, decreased stance time- Right, decreased ankle dorsiflexion- Right, ataxic, trendelenburg, lateral lean- Left, and decreased trunk rotation Distance walked: 244f Assistive device utilized: None Level of assistance: Complete Independence Comments: Decreased step length, L shoulder height is elevated, favors LLE, trunk lean to L, trendelenberg , decreased speed  FUNCTIONAL TESTs:  5 times sit to stand: 12.55s, 9.20s  Timed up and go (TUG): 11.05s,  10.49s Functional gait assessment: 13/30 SLS L 2 secs at best R unable to do w/o support  4 stage balance (30s) EO firm EC firm min sway EO foam  EC foam CGA   TODAY'S TREATMENT:  07/09/22 Treadmill 2.5 incline, 1.7speed 573ms  Bird dogs 2x10 Step up with high knee 8" 2x10 bilat Walking lunges  Lunges in place x10 bilat Leg press 80# 2x10, 40# 2x10 RLE    07/05/22 Elliptical L4 x5m7m  Tandem standing on balance beam catching ball Side steps on balance beam catching  SL RDL w/1HHA 5# x10 bilat Stretching SK2C, HS 30s bilat   Knee ext 65# x10, RLE only 45# x10 Knee flex 55# x10, RLE only 35# x10   07/02/22 Gait around building and in grass  Resisted gait 30# forwards, backwards and stepping over obstacles  Standing on airex SL tapping cones w/1HHA Tandem walking  STS w/OHP 10# bilat 2x5   06/28/22 Elliptical L3 x5mi69m Step over onto airex  On airex hitting ball  Feet together   Half tandem Leg press 60# x10, x10 RLE eccentric, RLE only 40# x10 Mini squats on BOSU 2x10 Reaching for cones with rotation on BOSU- Orange, blue, yellow, green, black   06/26/22 NuStep level 5 x 6 minutes LE only Gait around the building in the back good pace On airex ball toss, walking ball toss Resisted gait all directions 50# 20# farmer carry, overhead carry Tandem walk,  On bosu ball toss, direction changes   06/21/22 Nustep level 5 x 6 minutes LE's only Gait around the back building good pace Walking ball toss On airex ball toss, head turns up and down as well, eyes closed Side stepping ball toss Step on and off airex side stepping Side stepping and tandem walk on the airex balance beam On airex high toe cone touch SLS cone touch with alternating hands On bosu balance ball toss Gave handout of advancing balance activities eyes open and closed , narrow BOS and on foam    06/19/22 Nu-Step L5 x 6 minutes. Progressive single limb challenges- stood in front of 6"  step,  alternating taps on steps, progressed to multiple taps with each foot, then tapping with rotation. 10 reps each B. Stood in front of 5 cones placed in semicircle on the floor-alternately tapping each cone then progressed to tapping 2 cones and then 3 cones in a row, including rotation across midline. He required occasional light steadying A. Monster walks with red Tband around ankles, 2 x 20', occasional min a for balance. Shuffling R and L, 2 x 20'. Patient was able to achieve and maintain a true shuffle, slightly slow. Jump switch with UUE support, lateral, then in stride-more difficulty with stride, but able to perform both and started to increase speed.  06/14/22 Bike L5 x2mns Backwards walking on treadmill  Tandem standing 30s each side  Tandem standing catching ball on firm  Mini squats on BOSU w/fingertip support Forwards and side lunges on BOSU x10 each side SL mini squats w/2HHA in // bars    PATIENT EDUCATION: Education details: POC, fall risk Person educated: Patient Education method: Explanation and Verbal cues Education comprehension: verbalized understanding and returned demonstration   HOME EXERCISE PROGRAM: [id number]   GOALS: Goals reviewed with patient? Yes  SHORT TERM GOALS: Target date: 06/05/22  Patient will be independent with initial HEP. Goal status:  met  2.  Patient will be educated on strategies to decrease risk of falls.  Goal status: MET   LONG TERM GOALS: Target date: 07/02/22  Patient will be independent with advanced/ongoing HEP to improve outcomes and carryover.  Goal status: INITIAL  2.  Patient will demonstrate at least 25/30 on FGA to improve gait stability and reduce risk for falls. Baseline: 13/30, 22/30 Goal status: IN PROGRESS  3.  Patient will demonstrate decreased fall risk by scoring < 12 sec on 5xSTS. Goal status: MET  4. Patient will demonstrate SLS >10 secs to decrease fall risk and improve balance.   Goal status: MET     5. Patient will be able to stand with narrow base for >10s and walk at least 10 tandem steps    07/02/22- 7s on R, 12s on L, 8steps at best for tandem walking  Goal status: progressing  ASSESSMENT:  CLINICAL IMPRESSION:  Patient is doing well, we worked on activities hat required more coordination today. Some frustration with coordination but with more reps he is able to complete all activities with motivation. CGA with step ups due to LOB when standing on RLE. Difficulty with walking lunges, modified to do standing in place. Still has some trouble due to be unsteady but complete 10 on each side.   OBJECTIVE IMPAIRMENTS Abnormal gait, decreased balance, decreased coordination, and decreased strength.   ACTIVITY LIMITATIONS transfers and locomotion level  PARTICIPATION LIMITATIONS: driving, community activity, and yard work  PERSONAL FACTORS 1-2 comorbidities: HTN, hx of strokes  are also affecting patient's functional outcome.   REHAB POTENTIAL: Good  CLINICAL DECISION MAKING: Stable/uncomplicated  EVALUATION COMPLEXITY: Low  PLAN: PT FREQUENCY: 2x/week  PT DURATION: 8 weeks  PLANNED INTERVENTIONS: Therapeutic exercises, Therapeutic activity, Neuromuscular re-education, Balance training, Gait training, Patient/Family education, Joint mobilization, Stair training, Cryotherapy, Moist heat, Biofeedback, and Re-evaluation  PLAN FOR NEXT SESSION: Continue with progressive balance activities, cognitive dual tasking, bird dogs    MAndris Baumann DPT 07/09/2022, 1:16 PM

## 2022-07-09 ENCOUNTER — Ambulatory Visit: Payer: PPO

## 2022-07-09 DIAGNOSIS — I69351 Hemiplegia and hemiparesis following cerebral infarction affecting right dominant side: Secondary | ICD-10-CM

## 2022-07-09 DIAGNOSIS — I639 Cerebral infarction, unspecified: Secondary | ICD-10-CM

## 2022-07-09 DIAGNOSIS — R278 Other lack of coordination: Secondary | ICD-10-CM

## 2022-07-09 DIAGNOSIS — R2689 Other abnormalities of gait and mobility: Secondary | ICD-10-CM

## 2022-07-11 ENCOUNTER — Encounter: Payer: Self-pay | Admitting: Physical Therapy

## 2022-07-11 ENCOUNTER — Ambulatory Visit: Payer: PPO | Admitting: Physical Therapy

## 2022-07-11 DIAGNOSIS — R278 Other lack of coordination: Secondary | ICD-10-CM | POA: Diagnosis not present

## 2022-07-11 DIAGNOSIS — I639 Cerebral infarction, unspecified: Secondary | ICD-10-CM

## 2022-07-11 DIAGNOSIS — R2689 Other abnormalities of gait and mobility: Secondary | ICD-10-CM

## 2022-07-11 DIAGNOSIS — I69351 Hemiplegia and hemiparesis following cerebral infarction affecting right dominant side: Secondary | ICD-10-CM

## 2022-07-11 NOTE — Therapy (Signed)
OUTPATIENT PHYSICAL THERAPY NEURO TREATMENT   Patient Name: Ivan Lawson MRN: [id number] DOB:06/22/1956, 66 y.o., male Today's Date: 07/11/2022   PCP: Gaynelle Arabian REFERRING PROVIDER: Lauraine Rinne   PT End of Session - 07/11/22 1058     Visit Number 19    Date for PT Re-Evaluation 07/18/22    PT Start Time 1054    PT Stop Time 1135    PT Time Calculation (min) 41 min    Activity Tolerance Patient tolerated treatment well    Behavior During Therapy [facility] for tasks assessed/performed               Past Medical History:  Diagnosis Date   Hyperthyroidism    Past Surgical History:  Procedure Laterality Date   [place]   FINGER SURGERY     Patient Active Problem List   Diagnosis Date Noted   Bilateral carotid artery stenosis 06/21/2022   History of CVA (cerebrovascular accident) 06/21/2022   Pure hypercholesterolemia 06/21/2022   Left pontine cerebrovascular accident ([place]) 04/16/2022   Basilar artery stenosis 04/13/2022   Stroke-like symptom 04/12/2022   Cerebral thrombosis with cerebral infarction 04/12/2022   Acute ischemic stroke ([place]) 04/06/2022   Sinus bradycardia 04/06/2022   Erythrocytosis 04/06/2022   Acquired hypothyroidism 04/06/2022   B12 deficiency 01/07/2018   Hypothyroidism following radioiodine therapy 09/25/2016   Elevated PSA 05/03/2016   Numbness 03/22/2016   Essential hypertension 11/22/2015   Bilateral hearing loss 11/22/2015   Tinnitus 11/22/2015      ONSET DATE: 04/06/22  REFERRING DIAG: I63.9  THERAPY DIAG:  Other abnormalities of gait and mobility  Other lack of coordination  Hemiplegia and hemiparesis following cerebral infarction affecting right dominant side (HCC)  Left pontine cerebrovascular accident [facility])  Rationale for Evaluation and Treatment Rehabilitation  SUBJECTIVE:                                                                                                                                                                                               SUBJECTIVE STATEMENT: Doing alright, I am getting better. I feel like I am 95% better, but the last 5% may be tough.   PERTINENT HISTORY: Acute L paramedian pons infarct w/ increased extent of involvement compared w/ 04/06/22  PMH includes gout that flared up last Wednesday, HTN, acquired hypothyroidism following radioactive iodine therapy for Graves' disease, and prediabetes  PAIN:  Are you having pain? Yes: NPRS scale: 0/10  PRECAUTIONS: Fall  WEIGHT BEARING RESTRICTIONS No  FALLS: Has patient fallen in last 6 months? No  LIVING ENVIRONMENT: Lives with: lives alone Lives in: House/apartment Stairs:  Yes: External: 5 steps; can reach both Has following equipment at home: Walker - 4 wheeled and Wheelchair (manual)  PLOF: Independent, retired  PATIENT GOALS be able to walk naturally, 10,000-12,000 steps a day again  OBJECTIVE:   UPPER EXTREMITY ROM: WFL UPPER EXTREMITY MMT: 4+ flexion R  LOWER EXTREMITY ROM:  [facility]  MMT Right Eval Left Eval Right 7/11 Left  7/11  Hip flexion 4 4+ 4+ 5  Hip extension      Hip abduction      Hip adduction      Hip internal rotation 4 4 4+ 4+  Hip external rotation 4 4 4+ 4+  Knee flexion 4- 4+ 4+ 5  Knee extension 4- 4+ 4+ 5  Ankle dorsiflexion '5 5 5 5  ' Ankle plantarflexion '5 5  5  ' Ankle inversion      Ankle eversion       (Blank rows = not tested)   GAIT: Gait pattern: decreased arm swing- Right, decreased step length- Right, decreased stance time- Right, decreased ankle dorsiflexion- Right, ataxic, trendelenburg, lateral lean- Left, and decreased trunk rotation Distance walked: 243f Assistive device utilized: None Level of assistance: Complete Independence Comments: Decreased step length, L shoulder height is elevated, favors LLE, trunk lean to L, trendelenberg , decreased speed  FUNCTIONAL TESTs:  5 times sit to stand: 12.55s, 9.20s  Timed up and go (TUG): 11.05s,  10.49s Functional gait assessment: 13/30 SLS L 2 secs at best R unable to do w/o support  4 stage balance (30s) EO firm EC firm min sway EO foam  EC foam CGA   TODAY'S TREATMENT:  07/14/22 Elliptical L2 3 min forw/2 min back B step touch on cone and side step, all on black floormat, 4 x 4 in each direction. Increased challenge to 3 taps, required increased A-min. Floor ladder-walking through step over step, slowly for balance. Changing directions in stepping forward and sideways. Quick steps forw/back, side to side x 30 sec each, with small hop. Fatigued on last set. Shuffling side to side 5 x 15' in each direction Jump changing feet, abd/add, then alternating stride, BUE support on rail-60 seconds each SLS on airex. Max of 5 sec on RLE.   07/09/22 Treadmill 2.5 incline, 1.7speed 540ms  Bird dogs 2x10 Step up with high knee 8" 2x10 bilat Walking lunges  Lunges in place x10 bilat Leg press 80# 2x10, 40# 2x10 RLE    07/05/22 Elliptical L4 x5m29m  Tandem standing on balance beam catching ball Side steps on balance beam catching  SL RDL w/1HHA 5# x10 bilat Stretching SK2C, HS 30s bilat   Knee ext 65# x10, RLE only 45# x10 Knee flex 55# x10, RLE only 35# x10   07/02/22 Gait around building and in grass  Resisted gait 30# forwards, backwards and stepping over obstacles  Standing on airex SL tapping cones w/1HHA Tandem walking  STS w/OHP 10# bilat 2x5   06/28/22 Elliptical L3 x5mi22m Step over onto airex  On airex hitting ball  Feet together   Half tandem Leg press 60# x10, x10 RLE eccentric, RLE only 40# x10 Mini squats on BOSU 2x10 Reaching for cones with rotation on BOSU- Orange, blue, yellow, green, black   06/26/22 NuStep level 5 x 6 minutes LE only Gait around the building in the back good pace On airex ball toss, walking ball toss Resisted gait all directions 50# 20# farmer carry, overhead carry Tandem walk,  On bosu ball toss, direction  changes   06/21/22  Nustep level 5 x 6 minutes LE's only Gait around the back building good pace Walking ball toss On airex ball toss, head turns up and down as well, eyes closed Side stepping ball toss Step on and off airex side stepping Side stepping and tandem walk on the airex balance beam On airex high toe cone touch SLS cone touch with alternating hands On bosu balance ball toss Gave handout of advancing balance activities eyes open and closed , narrow BOS and on foam    06/19/22 Nu-Step L5 x 6 minutes. Progressive single limb challenges- stood in front of 6" step, alternating taps on steps, progressed to multiple taps with each foot, then tapping with rotation. 10 reps each B. Stood in front of 5 cones placed in semicircle on the floor-alternately tapping each cone then progressed to tapping 2 cones and then 3 cones in a row, including rotation across midline. He required occasional light steadying A. Monster walks with red Tband around ankles, 2 x 20', occasional min a for balance. Shuffling R and L, 2 x 20'. Patient was able to achieve and maintain a true shuffle, slightly slow. Jump switch with UUE support, lateral, then in stride-more difficulty with stride, but able to perform both and started to increase speed.  06/14/22 Bike L5 x73mns Backwards walking on treadmill  Tandem standing 30s each side  Tandem standing catching ball on firm  Mini squats on BOSU w/fingertip support Forwards and side lunges on BOSU x10 each side SL mini squats w/2HHA in // bars    PATIENT EDUCATION: Education details: POC, fall risk Person educated: Patient Education method: Explanation and Verbal cues Education comprehension: verbalized understanding and returned demonstration   HOME EXERCISE PROGRAM: [id number]   GOALS: Goals reviewed with patient? Yes  SHORT TERM GOALS: Target date: 06/05/22  Patient will be independent with initial HEP. Goal status:  met  2.  Patient will be  educated on strategies to decrease risk of falls.  Goal status: MET   LONG TERM GOALS: Target date: 07/02/22  Patient will be independent with advanced/ongoing HEP to improve outcomes and carryover.  Goal status: INITIAL  2.  Patient will demonstrate at least 25/30 on FGA to improve gait stability and reduce risk for falls. Baseline: 13/30, 22/30 Goal status: IN PROGRESS  3.  Patient will demonstrate decreased fall risk by scoring < 12 sec on 5xSTS. Goal status: MET  4. Patient will demonstrate SLS >10 secs to decrease fall risk and improve balance.   Goal status: MET    5. Patient will be able to stand with narrow base for >10s and walk at least 10 tandem steps    07/02/22- 7s on R, 12s on L, 8steps at best for tandem walking  Goal status: progressing  ASSESSMENT:  CLINICAL IMPRESSION:  Patient is doing well. Continued to emphasize advanced balance and stability as well as speed of movement and control of RLE. He continues to demonstrate progress in both areas, able to tolerate increased challenges.  OBJECTIVE IMPAIRMENTS Abnormal gait, decreased balance, decreased coordination, and decreased strength.   ACTIVITY LIMITATIONS transfers and locomotion level  PARTICIPATION LIMITATIONS: driving, community activity, and yard work  PERSONAL FACTORS 1-2 comorbidities: HTN, hx of strokes  are also affecting patient's functional outcome.   REHAB POTENTIAL: Good  CLINICAL DECISION MAKING: Stable/uncomplicated  EVALUATION COMPLEXITY: Low  PLAN: PT FREQUENCY: 2x/week  PT DURATION: 8 weeks  PLANNED INTERVENTIONS: Therapeutic exercises, Therapeutic activity, Neuromuscular re-education, Balance training, Gait training, Patient/Family education, Joint mobilization, Stair  training, Cryotherapy, Moist heat, Biofeedback, and Re-evaluation  PLAN FOR NEXT SESSION: Continue with progressive balance activities, cognitive dual tasking, bird dogs    Ethel Rana DPT 07/11/22 11:39 AM   07/11/2022, 11:39 AM

## 2022-07-16 ENCOUNTER — Encounter: Payer: Self-pay | Admitting: Physical Therapy

## 2022-07-16 ENCOUNTER — Ambulatory Visit: Payer: PPO | Admitting: Physical Therapy

## 2022-07-16 DIAGNOSIS — I639 Cerebral infarction, unspecified: Secondary | ICD-10-CM

## 2022-07-16 DIAGNOSIS — R2689 Other abnormalities of gait and mobility: Secondary | ICD-10-CM

## 2022-07-16 DIAGNOSIS — R278 Other lack of coordination: Secondary | ICD-10-CM

## 2022-07-16 DIAGNOSIS — I69351 Hemiplegia and hemiparesis following cerebral infarction affecting right dominant side: Secondary | ICD-10-CM

## 2022-07-16 NOTE — Therapy (Signed)
OUTPATIENT PHYSICAL THERAPY NEURO TREATMENT Progress Note Reporting Period 06/14/22 to 07/16/22 for visits 11-20  See note below for Objective Data and Assessment of Progress/Goals.      Patient Name: Ivan Lawson MRN: [id number] DOB:Dec 25, 1955, 66 y.o., male Today's Date: 07/16/2022   PCP: Gaynelle Arabian REFERRING PROVIDER: Lauraine Rinne   PT End of Session - 07/16/22 1124     Visit Number 20    Date for PT Re-Evaluation 07/18/22    PT Start Time 85    PT Stop Time 1145    PT Time Calculation (min) 45 min    Activity Tolerance Patient tolerated treatment well    Behavior During Therapy [facility] for tasks assessed/performed               Past Medical History:  Diagnosis Date   Hyperthyroidism    Past Surgical History:  Procedure Laterality Date   [place]   FINGER SURGERY     Patient Active Problem List   Diagnosis Date Noted   Bilateral carotid artery stenosis 06/21/2022   History of CVA (cerebrovascular accident) 06/21/2022   Pure hypercholesterolemia 06/21/2022   Left pontine cerebrovascular accident ([place]) 04/16/2022   Basilar artery stenosis 04/13/2022   Stroke-like symptom 04/12/2022   Cerebral thrombosis with cerebral infarction 04/12/2022   Acute ischemic stroke ([place]) 04/06/2022   Sinus bradycardia 04/06/2022   Erythrocytosis 04/06/2022   Acquired hypothyroidism 04/06/2022   B12 deficiency 01/07/2018   Hypothyroidism following radioiodine therapy 09/25/2016   Elevated PSA 05/03/2016   Numbness 03/22/2016   Essential hypertension 11/22/2015   Bilateral hearing loss 11/22/2015   Tinnitus 11/22/2015      ONSET DATE: 04/06/22  REFERRING DIAG: I63.9  THERAPY DIAG:  Other abnormalities of gait and mobility  Other lack of coordination  Hemiplegia and hemiparesis following cerebral infarction affecting right dominant side (HCC)  Left pontine cerebrovascular accident [facility])  Rationale for Evaluation and Treatment  Rehabilitation  SUBJECTIVE:                                                                                                                                                                                              SUBJECTIVE STATEMENT: Patient reports that he had some issues with gout in the right big toe after the last treatment, reports that he did not walk over the weekend due to pain  PERTINENT HISTORY: Acute L paramedian pons infarct w/ increased extent of involvement compared w/ 04/06/22  PMH includes gout that flared up last Wednesday, HTN, acquired hypothyroidism following radioactive iodine therapy for Graves' disease, and prediabetes  PAIN:  Are you having pain?  Yes: NPRS scale: 0/10 some pain in the right big toe with walking up to 5/10 over the weekend  PRECAUTIONS: Fall  WEIGHT BEARING RESTRICTIONS No  FALLS: Has patient fallen in last 6 months? No  LIVING ENVIRONMENT: Lives with: lives alone Lives in: House/apartment Stairs: Yes: External: 5 steps; can reach both Has following equipment at home: Walker - 4 wheeled and Wheelchair (manual)  PLOF: Independent, retired  PATIENT GOALS be able to walk naturally, 10,000-12,000 steps a day again  OBJECTIVE:   UPPER EXTREMITY ROM: WFL UPPER EXTREMITY MMT: 4+ flexion R  LOWER EXTREMITY ROM:  [facility]  MMT Right Eval Left Eval Right 7/11 Left  7/11  Hip flexion 4 4+ 4+ 5  Hip extension      Hip abduction      Hip adduction      Hip internal rotation 4 4 4+ 4+  Hip external rotation 4 4 4+ 4+  Knee flexion 4- 4+ 4+ 5  Knee extension 4- 4+ 4+ 5  Ankle dorsiflexion '5 5 5 5  ' Ankle plantarflexion '5 5  5  ' Ankle inversion      Ankle eversion       (Blank rows = not tested)   GAIT: Gait pattern: decreased arm swing- Right, decreased step length- Right, decreased stance time- Right, decreased ankle dorsiflexion- Right, ataxic, trendelenburg, lateral lean- Left, and decreased trunk rotation Distance walked:  259f Assistive device utilized: None Level of assistance: Complete Independence Comments: Decreased step length, L shoulder height is elevated, favors LLE, trunk lean to L, trendelenberg , decreased speed  FUNCTIONAL TESTs:  5 times sit to stand: 12.55s, 9.20s  Timed up and go (TUG): 11.05s, 10.49s Functional gait assessment: 13/30 SLS L 2 secs at best R unable to do w/o support  4 stage balance (30s) EO firm EC firm min sway EO foam  EC foam CGA   TODAY'S TREATMENT:  07/16/22 Bike level 4 x 6 minutes with power burst last 5 seconds every minute Worked a lot of vestibular system on airex,, eyes open/closed , head up and down and side to side Changed his BOS to narrower Added on airex 3 cone toe touch On bosu balance balance, reaching out with hand to cone on stool, Bosu upright and upside down  07/14/22 Elliptical L2 3 min forw/2 min back B step touch on cone and side step, all on black floormat, 4 x 4 in each direction. Increased challenge to 3 taps, required increased A-min. Floor ladder-walking through step over step, slowly for balance. Changing directions in stepping forward and sideways. Quick steps forw/back, side to side x 30 sec each, with small hop. Fatigued on last set. Shuffling side to side 5 x 15' in each direction Jump changing feet, abd/add, then alternating stride, BUE support on rail-60 seconds each SLS on airex. Max of 5 sec on RLE.   07/09/22 Treadmill 2.5 incline, 1.7speed 527ms  Bird dogs 2x10 Step up with high knee 8" 2x10 bilat Walking lunges  Lunges in place x10 bilat Leg press 80# 2x10, 40# 2x10 RLE    07/05/22 Elliptical L4 x5m67m  Tandem standing on balance beam catching ball Side steps on balance beam catching  SL RDL w/1HHA 5# x10 bilat Stretching SK2C, HS 30s bilat   Knee ext 65# x10, RLE only 45# x10 Knee flex 55# x10, RLE only 35# x10   07/02/22 Gait around building and in grass  Resisted gait 30# forwards, backwards and stepping  over obstacles  Standing on airex  SL tapping cones w/1HHA Tandem walking  STS w/OHP 10# bilat 2x5   06/28/22 Elliptical L3 x58mns  Step over onto airex  On airex hitting ball  Feet together   Half tandem Leg press 60# x10, x10 RLE eccentric, RLE only 40# x10 Mini squats on BOSU 2x10 Reaching for cones with rotation on BOSU- Orange, blue, yellow, green, black   06/26/22 NuStep level 5 x 6 minutes LE only Gait around the building in the back good pace On airex ball toss, walking ball toss Resisted gait all directions 50# 20# farmer carry, overhead carry Tandem walk,  On bosu ball toss, direction changes   PATIENT EDUCATION: Education details: POC, fall risk Person educated: Patient Education method: Explanation and Verbal cues Education comprehension: verbalized understanding and returned demonstration   HOME EXERCISE PROGRAM: [id number]   GOALS: Goals reviewed with patient? Yes  SHORT TERM GOALS: Target date: 06/05/22  Patient will be independent with initial HEP. Goal status:  met  2.  Patient will be educated on strategies to decrease risk of falls.  Goal status: MET   LONG TERM GOALS: Target date: 07/02/22  Patient will be independent with advanced/ongoing HEP to improve outcomes and carryover.  Goal status: INITIAL  2.  Patient will demonstrate at least 25/30 on FGA to improve gait stability and reduce risk for falls. Baseline: 13/30, 22/30 Goal status: IN PROGRESS  3.  Patient will demonstrate decreased fall risk by scoring < 12 sec on 5xSTS. Goal status: MET  4. Patient will demonstrate SLS >10 secs to decrease fall risk and improve balance.   Goal status: MET    5. Patient will be able to stand with narrow base for >10s and walk at least 10 tandem steps    07/02/22- 7s on R, 12s on L, 8steps at best for tandem walking  Goal status: progressing  ASSESSMENT:  CLINICAL IMPRESSION:  Patient reports that he was not able to walk much this weekend due to  toe pain from gout after the last session.  I advanced his balance and higher vestibular activities, he did well, he does struggle but typically will catch him self when he loses his balance  OBJECTIVE IMPAIRMENTS Abnormal gait, decreased balance, decreased coordination, and decreased strength.   ACTIVITY LIMITATIONS transfers and locomotion level  PARTICIPATION LIMITATIONS: driving, community activity, and yard work  PERSONAL FACTORS 1-2 comorbidities: HTN, hx of strokes  are also affecting patient's functional outcome.   REHAB POTENTIAL: Good  CLINICAL DECISION MAKING: Stable/uncomplicated  EVALUATION COMPLEXITY: Low  PLAN: PT FREQUENCY: 2x/week  PT DURATION: 8 weeks  PLANNED INTERVENTIONS: Therapeutic exercises, Therapeutic activity, Neuromuscular re-education, Balance training, Gait training, Patient/Family education, Joint mobilization, Stair training, Cryotherapy, Moist heat, Biofeedback, and Re-evaluation  PLAN FOR NEXT SESSION: Continue with progressive balance activities, cognitive dual tasking, bird dogs   MLum Babe PT 07/16/22 11:29 AM

## 2022-07-17 ENCOUNTER — Encounter (HOSPITAL_BASED_OUTPATIENT_CLINIC_OR_DEPARTMENT_OTHER): Payer: Self-pay

## 2022-07-17 DIAGNOSIS — E0590 Thyrotoxicosis, unspecified without thyrotoxic crisis or storm: Secondary | ICD-10-CM | POA: Insufficient documentation

## 2022-07-18 ENCOUNTER — Encounter: Payer: Self-pay | Admitting: Physical Therapy

## 2022-07-18 ENCOUNTER — Encounter (HOSPITAL_BASED_OUTPATIENT_CLINIC_OR_DEPARTMENT_OTHER): Payer: Self-pay | Admitting: Cardiology

## 2022-07-18 ENCOUNTER — Ambulatory Visit (INDEPENDENT_AMBULATORY_CARE_PROVIDER_SITE_OTHER): Payer: PPO | Admitting: Cardiology

## 2022-07-18 ENCOUNTER — Ambulatory Visit: Payer: PPO | Admitting: Physical Therapy

## 2022-07-18 VITALS — BP 152/86 | HR 76 | Ht 72.0 in | Wt 205.6 lb

## 2022-07-18 DIAGNOSIS — I10 Essential (primary) hypertension: Secondary | ICD-10-CM

## 2022-07-18 DIAGNOSIS — E7800 Pure hypercholesterolemia, unspecified: Secondary | ICD-10-CM | POA: Diagnosis not present

## 2022-07-18 DIAGNOSIS — I69351 Hemiplegia and hemiparesis following cerebral infarction affecting right dominant side: Secondary | ICD-10-CM

## 2022-07-18 DIAGNOSIS — R278 Other lack of coordination: Secondary | ICD-10-CM

## 2022-07-18 DIAGNOSIS — Z79899 Other long term (current) drug therapy: Secondary | ICD-10-CM | POA: Diagnosis not present

## 2022-07-18 DIAGNOSIS — Z8673 Personal history of transient ischemic attack (TIA), and cerebral infarction without residual deficits: Secondary | ICD-10-CM

## 2022-07-18 DIAGNOSIS — I639 Cerebral infarction, unspecified: Secondary | ICD-10-CM

## 2022-07-18 DIAGNOSIS — R2689 Other abnormalities of gait and mobility: Secondary | ICD-10-CM

## 2022-07-18 MED ORDER — VALSARTAN 40 MG PO TABS: 40.0000 mg | ORAL_TABLET | Freq: Every day | ORAL | 3 refills | Status: DC

## 2022-07-18 NOTE — Progress Notes (Signed)
Cardiology Office Note:    Date:  07/18/2022   ID:  Ivan Lawson, DOB [birth year], MRN [id number]  PCP:  Blair Heys, MD  Cardiologist:  Jodelle Red, MD  Referring MD: Blair Heys, MD   CC: Follow-up for history of stroke  History of Present Illness:    Ivan Lawson is a 66 y.o. male with a hx of hypertension, Grave's disease, hypothyroidism, hyperthyroidism, CKD stage IIIa, and prediabetes, who is seen for follow-up. He was initially seen 06/06/2022 as a new consult at the request of Blair Heys, MD for evaluation and management with discussion of monitor results following stroke.  Initially Ivan Lawson was admitted to the hospital 04/06/22 after presenting with new onset numbness to the fingers and toes of his right side. His work-up was suggestive of acute stroke and possible AKI. He was discharged on aspirin, Plavix, and statin combination, with Plavix to be stopped after 21 days. He was given a 30-day event monitor with cardiology follow-up. Due to resting bradycardia and renal insufficiency, his bisoprolol and HCTZ combination was discontinued. He was started on Norvasc with hydralazine combination.  He was re-admitted 04/12/2022 after presenting with concerns for right sided weakness and worsening dysarthria. An MRI confirmed evolution of his previously diagnosed stroke (left paramedian pontine infarct). CTA head and neck was significant for stenosis of mid basilar, left proximal PCA and right A1 origin. He was discharged on Brilinta/Aspirin for 30 days, followed by Plavix/Aspirin for 60 days, followed by Aspirin indefinitely. Also discharged on amlodipine 2.5 mg daily; hydralazine was discontinued.  Cardiovascular risk factors: Prior clinical ASCVD: 2 strokes in short succession as described above Comorbid conditions: Hypertension - Initially he had noticed his blood pressure was gradually increasing, as noted when he routinely donated blood. Prior to his stroke,  his blood pressure was averaging 135/60 at home. Since his stroke, he has noticed his blood pressure is now typically in the 150's-160's at home, confirmed in clinic today (150/80).    Metabolic syndrome/Obesity: BMI 28 Chronic inflammatory conditions: No. Tobacco use history: Never.  Family history: Both of his parents had minor strokes; his father died of a heart attack around 90 yo. He has one brother who also has issues with nephrolithiasis, no known cardiovascular issues. Prior cardiac testing and/or incidental findings on other testing (ie coronary calcium):  He reports he is scheduled for an ultrasound later today.  At his last appointment he felt he was gradually returning to his baseline. He had developed a gout flare-up in his right foot, which had resolved. We increased amlodipine from 2.5 mg to 5 mg daily.  Today: He reports that his blood pressure is not yet consistently at goal of less then 130/80. Since increasing his amlodipine to 5 mg, he has noticed a slight difference in his blood pressures. Most often, he notices readings of 140/80 on average at home. His readings have been highest in the mornings averaging 150/85. After he takes his antihypertensives his BP eases off throughout the day. His diastolic pressure is usually 78-82 at any time of day. His systolic pressure only very rarely drops below 135. One time after a brisk walk, his BP was 125/72.   He has completed his rehab. He feels as though he is close to returning to baseline, but not quite.  He is now off of Plavix.  He denies any palpitations, chest pain, shortness of breath, or peripheral edema. No lightheadedness, headaches, syncope, orthopnea, or PND.   Past Medical History:  Diagnosis  Date   Hyperthyroidism     Past Surgical History:  Procedure Laterality Date   BACK SURGERY  1990   FINGER SURGERY      Current Medications: Current Outpatient Medications on File Prior to Visit  Medication Sig    acetaminophen (TYLENOL) 325 MG tablet Take 1-2 tablets (325-650 mg total) by mouth every 4 (four) hours as needed for mild pain.   amLODipine (NORVASC) 10 MG tablet Take 1 tablet (10 mg total) by mouth daily.   aspirin 81 MG EC tablet Take 1 tablet (81 mg total) by mouth daily. Swallow whole.   diclofenac Sodium (VOLTAREN) 1 % GEL Apply 2 g topically 4 (four) times daily.   levothyroxine (SYNTHROID) 137 MCG tablet Take 137 mcg by mouth every morning.   methocarbamol (ROBAXIN) 500 MG tablet Take 1 tablet (500 mg total) by mouth every 6 (six) hours as needed for muscle spasms.   rosuvastatin (CRESTOR) 20 MG tablet Take 1 tablet (20 mg total) by mouth daily.   vitamin B-12 (CYANOCOBALAMIN) 1000 MCG tablet Take 1,000 mcg by mouth daily.   No current facility-administered medications on file prior to visit.     Allergies:   Benadryl [diphenhydramine]   Social History   Tobacco Use   Smoking status: Never   Smokeless tobacco: Never  Vaping Use   Vaping Use: Never used  Substance Use Topics   Alcohol use: Not Currently   Drug use: Not Currently    Family History: family history is negative for Thyroid disease.  ROS:   Please see the history of present illness. All other systems are reviewed and negative.    EKGs/Labs/Other Studies Reviewed:    The following studies were reviewed today:  Bilateral Carotid Doppler  06/06/2022: Summary:  Right Carotid: Velocities in the right ICA are consistent with a 1-39%  stenosis.   Left Carotid: Velocities in the left ICA are consistent with a 1-39%  stenosis.   Vertebrals:  Bilateral vertebral arteries demonstrate antegrade flow.  Subclavians: Normal flow hemodynamics were seen in bilateral subclavian arteries.   Monitor 05/2022: 30 days of data recorded on [employer] monitor. Patient had a min HR of 47 bpm, max HR of 181 bpm, and avg HR of 72 bpm. Predominant underlying rhythm was Sinus Rhythm. No VT, SVT, atrial fibrillation, high degree  block, or pauses noted. Isolated atrial and ventricular ectopy was rare (<1%). There were 6 triggered events, which were sinus rhythm/sinus tach. No significant arrhythmias detected.  CTA Head/Neck  04/12/2022: FINDINGS: CTA NECK FINDINGS   Aortic arch: Unremarkable   Right carotid system: Small mild plaque at the bifurcation without stenosis or ulceration   Left carotid system: Predominately calcified plaque at the ICA bulb. No stenosis or ulceration.   Vertebral arteries: No proximal subclavian stenosis. Dominant right vertebral artery. The vertebral arteries are smoothly contoured and diffusely patent   Skeleton: None   Other neck: Negative   Upper chest: Negative   Review of the MIP images confirms the above findings   CTA HEAD FINDINGS   Anterior circulation: Atheromatous calcification along the carotid siphons. No branch occlusion, beading, or aneurysm. High-grade right A1 origin Stenosis.   Posterior circulation: Dominant right vertebral artery. Fetal type bilateral PCA. At least moderate narrowing at the left P2 segment. Moderate mid basilar stenosis. No emergent branch occlusion, beading, or aneurysm.   Venous sinuses: Patent   Anatomic variants: As above   Review of the MIP images confirms the above findings   IMPRESSION: 1. No  emergent finding. 2. Intracranial atherosclerosis with most notable stenoses affecting the mid basilar, left proximal PCA, and right A1 origin. 3. No flow limiting stenosis or embolic source seen in the neck.  Echocardiogram  04/07/2022: Sonographer Comments: Technically difficult study due to poor echo  windows, suboptimal apical window and suboptimal subcostal window. Patient  had trouble following directions. Study delayed 20 mins to bathroom  patient.  IMPRESSIONS    1. Left ventricular ejection fraction, by estimation, is 60 to 65%. The  left ventricle has normal function. The left ventricle has no regional  wall motion  abnormalities. Left ventricular diastolic parameters were  normal.   2. Right ventricular systolic function is normal. The right ventricular  size is normal. Tricuspid regurgitation signal is inadequate for assessing  PA pressure.   3. The mitral valve is grossly normal. Trivial mitral valve  regurgitation.   4. The aortic valve is tricuspid. Aortic valve regurgitation is not  visualized.   5. The inferior vena cava is normal in size with greater than 50%  respiratory variability, suggesting right atrial pressure of 3 mmHg.   Comparison(s): No prior Echocardiogram.   EKG:  EKG is personally reviewed.   07/18/2022:  EKG was not ordered. 06/06/2022:  NSR at 83 bpm  Recent Labs: 04/07/2022: TSH 1.411 04/08/2022: Magnesium 1.9 04/17/2022: ALT 23 04/23/2022: Hemoglobin 15.0; Platelets 225 04/24/2022: BUN 20; Creatinine, Ser 1.40; Potassium 4.4; Sodium 138   Recent Lipid Panel    Component Value Date/Time   CHOL 153 04/07/2022 0900   TRIG 72 04/07/2022 0900   HDL 38 (L) 04/07/2022 0900   CHOLHDL 4.0 04/07/2022 0900   VLDL 14 04/07/2022 0900   LDLCALC 101 (H) 04/07/2022 0900    Physical Exam:    VS:  BP (!) 152/86 (BP Location: Right Arm, Patient Position: Sitting, Cuff Size: Normal)   Pulse 76   Ht 6' (1.829 m)   Wt 205 lb 9.6 oz (93.3 kg)   SpO2 97%   BMI 27.88 kg/m     Wt Readings from Last 3 Encounters:  07/18/22 205 lb 9.6 oz (93.3 kg)  06/21/22 206 lb 12.8 oz (93.8 kg)  06/06/22 208 lb 3.2 oz (94.4 kg)    GEN: Well nourished, well developed in no acute distress HEENT: Normal, moist mucous membranes NECK: No JVD CARDIAC: regular rhythm, normal S1 and S2, no rubs or gallops. No murmur. VASCULAR: Radial and DP pulses 2+ bilaterally. No carotid bruits RESPIRATORY:  Clear to auscultation without rales, wheezing or rhonchi  ABDOMEN: Soft, non-tender, non-distended MUSCULOSKELETAL:  Ambulates independently SKIN: Warm and dry, no edema NEUROLOGIC:  Alert and oriented x 3. No  focal neuro deficits noted. PSYCHIATRIC:  Normal affect    ASSESSMENT:    1. History of CVA (cerebrovascular accident)   2. Essential hypertension   3. Medication management   4. Pure hypercholesterolemia     PLAN:    CVA Carotid stenosis Hypercholesterolemia -monitor: no evidence of atrial fibrillation -echo: unremarkable -carotid ultrasounds: 1-39% bilateral carotid stenosis -ECG normal -completed DAPT, now on aspirin alone -on rosuvastatin 20 mg daily, has not yet been on statin for 2 months, has pending recheck in the future. LDL goal at least <70.  Hypertension -remains elevated today -continue amlodipine 5 mg daily -start valsartan. Given uptitration schedule. Recheck BMET in several weeks -Slow titration given recent strokes  Cardiac risk counseling and prevention recommendations: -recommend heart healthy/Mediterranean diet, with whole grains, fruits, vegetable, fish, lean meats, nuts, and olive oil. Limit salt. -  recommend moderate walking, 3-5 times/week for 30-50 minutes each session. Aim for at least 150 minutes.week. Goal should be pace of 3 miles/hours, or walking 1.5 miles in 30 minutes -recommend avoidance of tobacco products. Avoid excess alcohol.  Plan for follow up: 1-2 months or sooner as needed.   Jodelle Red, MD, PhD, [facility]  [facility]    Medication Adjustments/Labs and Tests Ordered: Current medicines are reviewed at length with the patient today.  Concerns regarding medicines are outlined above.   Orders Placed This Encounter  Procedures   Basic metabolic panel   Meds ordered this encounter  Medications   valsartan (DIOVAN) 40 MG tablet    Sig: Take 1 tablet (40 mg total) by mouth daily.    Dispense:  90 tablet    Refill:  3   Patient Instructions  Medication Instructions:  Blood pressure goal is still <130/80, but we want to get there slowly. We will start with valsartan 40 mg daily (low dose) and give that some  time to work. We will have you come in to recheck blood work in 2-3 weeks (on the third floor of our building). If in 3-4 weeks your blood pressure is still >130/80, call us and we will increase to 80 mg valsartan daily.  *If you need a refill on your cardiac medications before your next appointment, please call your pharmacy*   Lab Work: Your provider has recommended lab work in 2-3 weeks (BMP). Please have this collected at [facility] [place]. The lab is open 8:00 am - 4:30 pm. Please avoid 12:00p - 1:00p for lunch hour. You do not need an appointment. Please go to [address] [place], [place] [address]. This is in the Primary Care office on the 3rd floor, let them know you are there for blood work and they will direct you to the lab.    Testing/Procedures: None ordered today   Follow-Up: At [facility], you and your health needs are our priority.  As part of our continuing mission to provide you with exceptional heart care, we have created designated Provider Care Teams.  These Care Teams include your primary Cardiologist (physician) and Advanced Practice Providers (APPs -  Physician Assistants and Nurse Practitioners) who all work together to provide you with the care you need, when you need it.  We recommend signing up for the patient portal called "MyChart".  Sign up information is provided on this After Visit Summary.  MyChart is used to connect with patients for Virtual Visits (Telemedicine).  Patients are able to view lab/test results, encounter notes, upcoming appointments, etc.  Non-urgent messages can be sent to your provider as well.   To learn more about what you can do with MyChart, go to [email or web address].    Your next appointment:   2 month(s)  The format for your next appointment:   In Person  Provider:   Jodelle Red, MD{        Blood pressure goal is still <130/80, but we want to get there slowly. We will start  with valsartan 40 mg daily (low dose) and give that some time to work. We will have you come in to recheck blood work in 2-3 weeks (on the third floor of our building). If in 3-4 weeks your blood pressure is still >130/80, call us and we will increase to 80 mg valsartan daily.   I,Mathew Stumpf,acting as a Neurosurgeon for [employer], MD.,have documented all relevant documentation on the  behalf of Jodelle RedBridgette Sarh Kirschenbaum, MD,as directed by  Jodelle RedBridgette Hikeem Andersson, MD while in the presence of Jodelle RedBridgette Guiseppe Flanagan, MD.  I, Jodelle RedBridgette Roshelle Traub, MD, have reviewed all documentation for this visit. The documentation on 07/18/22 for the exam, diagnosis, procedures, and orders are all accurate and complete.   Signed, Jodelle RedBridgette Elior Robinette, MD PhD 07/18/2022     [facility]

## 2022-07-18 NOTE — Therapy (Signed)
OUTPATIENT PHYSICAL THERAPY NEURO TREATMENT   PHYSICAL THERAPY DISCHARGE SUMMARY  Visits from Start of Care: 21  Current functional level related to goals / functional outcomes: Patient has participated well and progressed toward LTG.    Remaining deficits: Patient is very close to baseline, but his balance remains minimally impaired.   Education / Equipment: HEP   Patient agrees to discharge. Patient goals were partially met. Patient is being discharged due to being pleased with the current functional level.     Patient Name: Ivan Lawson MRN: [id number] DOB:[birth year], 66 y.o., male Today's Date: 07/18/2022   PCP: Gaynelle Arabian REFERRING PROVIDER: Lauraine Rinne   PT End of Session - 07/18/22 1108     Visit Number 21    Date for PT Re-Evaluation 07/18/22    PT Start Time 1102    PT Stop Time 1142    PT Time Calculation (min) 40 min    Activity Tolerance Patient tolerated treatment well    Behavior During Therapy [facility] for tasks assessed/performed                Past Medical History:  Diagnosis Date   Hyperthyroidism    Past Surgical History:  Procedure Laterality Date   [place]     Patient Active Problem List   Diagnosis Date Noted   Hyperthyroidism 07/17/2022   Bilateral carotid artery stenosis 06/21/2022   History of CVA (cerebrovascular accident) 06/21/2022   Pure hypercholesterolemia 06/21/2022   Left pontine cerebrovascular accident ([place]) 04/16/2022   Basilar artery stenosis 04/13/2022   Stroke-like symptom 04/12/2022   Cerebral thrombosis with cerebral infarction 04/12/2022   Acute ischemic stroke ([place]) 04/06/2022   Sinus bradycardia 04/06/2022   Erythrocytosis 04/06/2022   Acquired hypothyroidism 04/06/2022   B12 deficiency 01/07/2018   Hypothyroidism following radioiodine therapy 09/25/2016   Elevated PSA 05/03/2016   Numbness 03/22/2016   Essential hypertension 11/22/2015   Bilateral hearing loss  11/22/2015   Tinnitus 11/22/2015      ONSET DATE: 04/06/22  REFERRING DIAG: I63.9  THERAPY DIAG:  Other abnormalities of gait and mobility  Other lack of coordination  Hemiplegia and hemiparesis following cerebral infarction affecting right dominant side (HCC)  Left pontine cerebrovascular accident [facility])  Rationale for Evaluation and Treatment Rehabilitation  SUBJECTIVE:                                                                                                                                                                                              SUBJECTIVE STATEMENT: Patient reports that he has improved significantly in all areas. He is about  95%, except for his balance. He still feels unsteady at unexpected times. It is improving, but still not bak.  PERTINENT HISTORY: Acute L paramedian pons infarct w/ increased extent of involvement compared w/ 04/06/22  PMH includes gout that flared up last Wednesday, HTN, acquired hypothyroidism following radioactive iodine therapy for Graves' disease, and prediabetes  PAIN:  Are you having pain? Yes: NPRS scale: 0/10 some pain in the right big toe with walking up to 5/10 over the weekend  PRECAUTIONS: Fall  WEIGHT BEARING RESTRICTIONS No  FALLS: Has patient fallen in last 6 months? No  LIVING ENVIRONMENT: Lives with: lives alone Lives in: House/apartment Stairs: Yes: External: 5 steps; can reach both Has following equipment at home: Walker - 4 wheeled and Wheelchair (manual)  PLOF: Independent, retired  PATIENT GOALS be able to walk naturally, 10,000-12,000 steps a day again  OBJECTIVE:   UPPER EXTREMITY ROM: WFL UPPER EXTREMITY MMT: 4+ flexion R  LOWER EXTREMITY ROM:  [facility]  MMT Right Eval Left Eval Right 7/11 Left  7/11  Hip flexion 4 4+ 4+ 5  Hip extension      Hip abduction      Hip adduction      Hip internal rotation 4 4 4+ 4+  Hip external rotation 4 4 4+ 4+  Knee flexion 4- 4+ 4+ 5  Knee extension 4-  4+ 4+ 5  Ankle dorsiflexion _0 Ankle plantarflexion _1 Ankle inversion      Ankle eversion       (Blank rows = not tested)   GAIT: Gait pattern: decreased arm swing- Right, decreased step length- Right, decreased stance time- Right, decreased ankle dorsiflexion- Right, ataxic, trendelenburg, lateral lean- Left, and decreased trunk rotation Distance walked: 277f Assistive device utilized: None Level of assistance: Complete Independence Comments: Decreased step length, L shoulder height is elevated, favors LLE, trunk lean to L, trendelenberg , decreased speed  FUNCTIONAL TESTs:  5 times sit to stand: 12.55s, 9.20s  Timed up and go (TUG): 11.05s, 10.49s Functional gait assessment: 13/30 SLS L 2 secs at best R unable to do w/o support  4 stage balance (30s) EO firm EC firm min sway EO foam  EC foam CGA   TODAY'S TREATMENT:  07/18/22 Bike level 4.5 x 6 minutes  07/16/22 Bike level 4 x 6 minutes with power burst last 5 seconds every minute Worked a lot of vestibular system on airex,, eyes open/closed , head up and down and side to side Changed his BOS to narrower Added on airex 3 cone toe touch On bosu balance balance, reaching out with hand to cone on stool, Bosu upright and upside down  07/14/22 Elliptical L2 3 min forw/2 min back B step touch on cone and side step, all on black floormat, 4 x 4 in each direction. Increased challenge to 3 taps, required increased A-min. Floor ladder-walking through step over step, slowly for balance. Changing directions in stepping forward and sideways. Quick steps forw/back, side to side x 30 sec each, with small hop. Fatigued on last set. Shuffling side to side 5 x 15' in each direction Jump changing feet, abd/add, then alternating stride, BUE support on rail-60 seconds each SLS on airex. Max of 5 sec on RLE.   07/09/22 Treadmill 2.5 incline, 1.7speed 571ms  Bird dogs 2x10 Step up with high knee 8" 2x10 bilat Walking lunges   Lunges in place x10 bilat Leg press 80# 2x10, 40# 2x10 RLE  07/05/22 Elliptical L4 x40mns  Tandem standing on balance beam catching ball Side steps on balance beam catching  SL RDL w/1HHA 5# x10 bilat Stretching SK2C, HS 30s bilat   Knee ext 65# x10, RLE only 45# x10 Knee flex 55# x10, RLE only 35# x10   07/02/22 Gait around building and in grass  Resisted gait 30# forwards, backwards and stepping over obstacles  Standing on airex SL tapping cones w/1HHA Tandem walking  STS w/OHP 10# bilat 2x5   06/28/22 Elliptical L3 x515ms  Step over onto airex  On airex hitting ball  Feet together   Half tandem Leg press 60# x10, x10 RLE eccentric, RLE only 40# x10 Mini squats on BOSU 2x10 Reaching for cones with rotation on BOSU- Orange, blue, yellow, green, black   06/26/22 NuStep level 5 x 6 minutes LE only Gait around the building in the back good pace On airex ball toss, walking ball toss Resisted gait all directions 50# 20# farmer carry, overhead carry Tandem walk,  On bosu ball toss, direction changes   PATIENT EDUCATION: Education details: POC, fall risk Person educated: Patient Education method: Explanation and Verbal cues Education comprehension: verbalized understanding and returned demonstration   HOME EXERCISE PROGRAM: [id number]   GOALS: Goals reviewed with patient? Yes  SHORT TERM GOALS: Target date: 06/05/22  Patient will be independent with initial HEP. Goal status:  met  2.  Patient will be educated on strategies to decrease risk of falls.  Goal status: MET   LONG TERM GOALS: Target date: 07/02/22  Patient will be independent with advanced/ongoing HEP to improve outcomes and carryover.  Goal status: met  2.  Patient will demonstrate at least 25/30 on FGA to improve gait stability and reduce risk for falls. Baseline: 13/30, 22/30, 8/17-26 Goal status: met  3.  Patient will demonstrate decreased fall risk by scoring < 12 sec on 5xSTS. Goal status:  MET  4. Patient will demonstrate SLS >10 secs to decrease fall risk and improve balance.   Goal status: MET    5. Patient will be able to stand with narrow base for >10s and walk at least 10 tandem steps    07/02/22- 7s on R, 12s on L, 8steps at best for tandem walking  Goal status: Not met, unable to complete 10 tandem steps  ASSESSMENT:  CLINICAL IMPRESSION:  Patient reports that he feels he is 95% better, still wants to improve his balance. He feels he can accomplish this with an HEP. His program was updated to include additional balance training. He has progressed well and met all LTG except tandem steps x 10.  OBJECTIVE IMPAIRMENTS Abnormal gait, decreased balance, decreased coordination, and decreased strength.   ACTIVITY LIMITATIONS transfers and locomotion level  PARTICIPATION LIMITATIONS: driving, community activity, and yard work  PERSONAL FACTORS 1-2 comorbidities: HTN, hx of strokes  are also affecting patient's functional outcome.   REHAB POTENTIAL: Good  CLINICAL DECISION MAKING: Stable/uncomplicated  EVALUATION COMPLEXITY: Low  PLAN: PT FREQUENCY: 2x/week  PT DURATION: 8 weeks  PLANNED INTERVENTIONS: Therapeutic exercises, Therapeutic activity, Neuromuscular re-education, Balance training, Gait training, Patient/Family education, Joint mobilization, Stair training, Cryotherapy, Moist heat, Biofeedback, and Re-evaluation  PLAN FOR NEXT SESSION: N/A  SuEthel RanaPT 07/18/22 12:42 PM  07/18/22 12:42 PM

## 2022-07-18 NOTE — Patient Instructions (Addendum)
Medication Instructions:  Blood pressure goal is still <130/80, but we want to get there slowly. We will start with valsartan 40 mg daily (low dose) and give that some time to work. We will have you come in to recheck blood work in 2-3 weeks (on the third floor of our building). If in 3-4 weeks your blood pressure is still >130/80, call us and we will increase to 80 mg valsartan daily.  *If you need a refill on your cardiac medications before your next appointment, please call your pharmacy*   Lab Work: Your provider has recommended lab work in 2-3 weeks (BMP). Please have this collected at [facility] [place]. The lab is open 8:00 am - 4:30 pm. Please avoid 12:00p - 1:00p for lunch hour. You do not need an appointment. Please go to [address] [place], [place] [address]. This is in the Primary Care office on the 3rd floor, let them know you are there for blood work and they will direct you to the lab.    Testing/Procedures: None ordered today   Follow-Up: At [facility], you and your health needs are our priority.  As part of our continuing mission to provide you with exceptional heart care, we have created designated Provider Care Teams.  These Care Teams include your primary Cardiologist (physician) and Advanced Practice Providers (APPs -  Physician Assistants and Nurse Practitioners) who all work together to provide you with the care you need, when you need it.  We recommend signing up for the patient portal called "MyChart".  Sign up information is provided on this After Visit Summary.  MyChart is used to connect with patients for Virtual Visits (Telemedicine).  Patients are able to view lab/test results, encounter notes, upcoming appointments, etc.  Non-urgent messages can be sent to your provider as well.   To learn more about what you can do with MyChart, go to [email or web address].    Your next appointment:   2 month(s)  The format for your next  appointment:   In Person  Provider:   Jodelle Red, MD{        Blood pressure goal is still <130/80, but we want to get there slowly. We will start with valsartan 40 mg daily (low dose) and give that some time to work. We will have you come in to recheck blood work in 2-3 weeks (on the third floor of our building). If in 3-4 weeks your blood pressure is still >130/80, call us and we will increase to 80 mg valsartan daily.

## 2022-08-02 DIAGNOSIS — Z79899 Other long term (current) drug therapy: Secondary | ICD-10-CM | POA: Diagnosis not present

## 2022-08-03 LAB — BASIC METABOLIC PANEL
BUN/Creatinine Ratio: 16 (ref 10–24)
BUN: 18 mg/dL (ref 8–27)
CO2: 25 mmol/L (ref 20–29)
Calcium: 9.7 mg/dL (ref 8.6–10.2)
Chloride: 102 mmol/L (ref 96–106)
Creatinine, Ser: 1.12 mg/dL (ref 0.76–1.27)
Glucose: 93 mg/dL (ref 70–99)
Potassium: 4.6 mmol/L (ref 3.5–5.2)
Sodium: 141 mmol/L (ref 134–144)
eGFR: 72 mL/min/{1.73_m2} (ref >59)

## 2022-08-16 ENCOUNTER — Telehealth (HOSPITAL_BASED_OUTPATIENT_CLINIC_OR_DEPARTMENT_OTHER): Payer: Self-pay | Admitting: Cardiology

## 2022-08-16 NOTE — Telephone Encounter (Signed)
Patient was a walk in today--he states he was told to stop by in about a month and give Dr. Cristal Deer his blood pressure readings and let her know how he is feeling.  I asked for a list of  his BP readings --he states they are all in his head.  He would like for the nurse to call so he can give his updates

## 2022-08-16 NOTE — Telephone Encounter (Signed)
Left message to call back  

## 2022-08-16 NOTE — Telephone Encounter (Signed)
Pt is returning call.  

## 2022-08-16 NOTE — Telephone Encounter (Signed)
Spoke with patient regarding blood pressure His am blood pressures running around 150/85 Takes medications and decreases during day, by 8 pm around 130/75 He does have some occasionally lower but very few, no SBP below 100   Confirmed taking Amlodipine 5 mg daily and Valsartan 40 mg daily   Will forward to Dr Cristal Deer for review

## 2022-08-19 NOTE — Telephone Encounter (Signed)
Pt updated and verbalized understanding.  

## 2022-08-19 NOTE — Telephone Encounter (Signed)
His blood pressures are good after taking the medication. I would continue current regimen and keep his follow up next month. If he can write down some blood pressures and bring with him, we can review at his visit. Thanks.

## 2022-08-20 ENCOUNTER — Encounter (HOSPITAL_BASED_OUTPATIENT_CLINIC_OR_DEPARTMENT_OTHER): Payer: Self-pay | Admitting: Cardiology

## 2022-09-18 ENCOUNTER — Ambulatory Visit (INDEPENDENT_AMBULATORY_CARE_PROVIDER_SITE_OTHER): Payer: PPO | Admitting: Cardiology

## 2022-09-18 ENCOUNTER — Encounter (HOSPITAL_BASED_OUTPATIENT_CLINIC_OR_DEPARTMENT_OTHER): Payer: Self-pay | Admitting: Cardiology

## 2022-09-18 VITALS — BP 148/92 | HR 70 | Ht 72.0 in | Wt 205.3 lb

## 2022-09-18 DIAGNOSIS — I10 Essential (primary) hypertension: Secondary | ICD-10-CM | POA: Diagnosis not present

## 2022-09-18 DIAGNOSIS — Z7189 Other specified counseling: Secondary | ICD-10-CM | POA: Diagnosis not present

## 2022-09-18 DIAGNOSIS — Z79899 Other long term (current) drug therapy: Secondary | ICD-10-CM | POA: Diagnosis not present

## 2022-09-18 DIAGNOSIS — Z8673 Personal history of transient ischemic attack (TIA), and cerebral infarction without residual deficits: Secondary | ICD-10-CM

## 2022-09-18 MED ORDER — VALSARTAN 80 MG PO TABS: 80.0000 mg | ORAL_TABLET | Freq: Every day | ORAL | Status: DC

## 2022-09-18 NOTE — Patient Instructions (Signed)
Medication Instructions:  We are going to increase your valsartan. You are currently on 40 mg daily.  Take 80 mg of valsartan daily. If blood pressure is consistently >140/90 after one week, increase to 160 mg daily.  Come and get bloodwork checked around week 2 to 3.  Call me after week 2 and tell me what dose you are taking so we can send it in to your pharmacy.   *If you need a refill on your cardiac medications before your next appointment, please call your pharmacy*   Lab Work: Your provider has recommended lab work in 2-3 weeks (BMP). Please have this collected at [facility] [place]. The lab is open 8:00 am - 4:30 pm. Please avoid 12:00p - 1:00p for lunch hour. You do not need an appointment. Please go to [address] [place], [place] [address]. This is in the Primary Care office on the 3rd floor, let them know you are there for blood work and they will direct you to the lab.  If you have labs (blood work) drawn today and your tests are completely normal, you will receive your results only by: [facility] (if you have MyChart) OR A paper copy in the mail If you have any lab test that is abnormal or we need to change your treatment, we will call you to review the results.   Testing/Procedures: None ordered today   Follow-Up: At [facility], you and your health needs are our priority.  As part of our continuing mission to provide you with exceptional heart care, we have created designated Provider Care Teams.  These Care Teams include your primary Cardiologist (physician) and Advanced Practice Providers (APPs -  Physician Assistants and Nurse Practitioners) who all work together to provide you with the care you need, when you need it.  We recommend signing up for the patient portal called "MyChart".  Sign up information is provided on this After Visit Summary.  MyChart is used to connect with patients for Virtual Visits (Telemedicine).   Patients are able to view lab/test results, encounter notes, upcoming appointments, etc.  Non-urgent messages can be sent to your provider as well.   To learn more about what you can do with MyChart, go to [email or web address].    Your next appointment:   6 week(s)  The format for your next appointment:   In Person  Provider:   Buford Dresser, MD    .

## 2022-09-18 NOTE — Progress Notes (Signed)
Cardiology Office Note:    Date:  09/18/2022   ID:  Ivan Lawson, DOB [birth year], MRN [id number]  PCP:  Gaynelle Arabian, MD  Cardiologist:  Buford Dresser, MD  Referring MD: Gaynelle Arabian, MD   CC: Follow-up  History of Present Illness:    Ivan Lawson is a 66 y.o. male with a hx of hypertension, Grave's disease, hypothyroidism, hyperthyroidism, CKD stage IIIa, and prediabetes, who is seen for follow-up. He was initially seen 06/06/2022 as a new consult at the request of Gaynelle Arabian, MD for evaluation and management with discussion of monitor results following stroke.  Initially Ivan Lawson was admitted to the hospital 04/06/22 after presenting with new onset numbness to the fingers and toes of his right side. His work-up was suggestive of acute stroke and possible AKI. He was discharged on aspirin, Plavix, and statin combination, with Plavix to be stopped after 21 days. He was given a 30-day event monitor with cardiology follow-up. Due to resting bradycardia and renal insufficiency, his bisoprolol and HCTZ combination was discontinued. He was started on Norvasc with hydralazine combination.  He was re-admitted 04/12/2022 after presenting with concerns for right sided weakness and worsening dysarthria. An MRI confirmed evolution of his previously diagnosed stroke (left paramedian pontine infarct). CTA head and neck was significant for stenosis of mid basilar, left proximal PCA and right A1 origin. He was discharged on Brilinta/Aspirin for 30 days, followed by Plavix/Aspirin for 60 days, followed by Aspirin indefinitely. Also discharged on amlodipine 2.5 mg daily; hydralazine was discontinued.  Cardiovascular risk factors: Prior clinical ASCVD: 2 strokes in short succession as described above Comorbid conditions: Hypertension - Initially he had noticed his blood pressure was gradually increasing, as noted when he routinely donated blood. Prior to his stroke, his blood pressure  was averaging 135/60 at home. Since his stroke, he has noticed his blood pressure is now typically in the 150's-160's at home, confirmed in clinic today (150/80).    Metabolic syndrome/Obesity: BMI 28 Chronic inflammatory conditions: No. Tobacco use history: Never.  Family history: Both of his parents had minor strokes; his father died of a heart attack around 22 yo. He has one brother who also has issues with nephrolithiasis, no known cardiovascular issues. Prior cardiac testing and/or incidental findings on other testing (ie coronary calcium):  He reports he is scheduled for an ultrasound later today.  At his last appointment he felt he was gradually returning to his baseline. He had developed a gout flare-up in his right foot, which had resolved. We increased amlodipine from 2.5 mg to 5 mg daily.  Today: Blood pressure was good for a short time, but then has increased again. Brings home log. Ranges 128/74-170/78. Checks 5 times/day. Elevated today. Takes all pills together.   Likes to drink orange juice, aware that this can raise potassium.   Denies chest pain, shortness of breath at rest or with normal exertion. No PND, orthopnea, LE edema or unexpected weight gain. No syncope or palpitations.   Past Medical History:  Diagnosis Date   Hyperthyroidism     Past Surgical History:  Procedure Laterality Date   BACK SURGERY  1990   FINGER SURGERY      Current Medications: Current Outpatient Medications on File Prior to Visit  Medication Sig   acetaminophen (TYLENOL) 325 MG tablet Take 1-2 tablets (325-650 mg total) by mouth every 4 (four) hours as needed for mild pain.   amLODipine (NORVASC) 10 MG tablet Take 1 tablet (10 mg total) by  mouth daily.   aspirin 81 MG EC tablet Take 1 tablet (81 mg total) by mouth daily. Swallow whole.   diclofenac Sodium (VOLTAREN) 1 % GEL Apply 2 g topically 4 (four) times daily.   levothyroxine (SYNTHROID) 137 MCG tablet Take 137 mcg by mouth every  morning.   methocarbamol (ROBAXIN) 500 MG tablet Take 1 tablet (500 mg total) by mouth every 6 (six) hours as needed for muscle spasms.   rosuvastatin (CRESTOR) 20 MG tablet Take 1 tablet (20 mg total) by mouth daily.   valsartan (DIOVAN) 40 MG tablet Take 1 tablet (40 mg total) by mouth daily.   vitamin B-12 (CYANOCOBALAMIN) 1000 MCG tablet Take 1,000 mcg by mouth daily.   No current facility-administered medications on file prior to visit.     Allergies:   Benadryl [diphenhydramine]   Social History   Tobacco Use   Smoking status: Never   Smokeless tobacco: Never  Vaping Use   Vaping Use: Never used  Substance Use Topics   Alcohol use: Not Currently   Drug use: Not Currently    Family History: family history is negative for Thyroid disease.  ROS:   Please see the history of present illness. All other systems are reviewed and negative.    EKGs/Labs/Other Studies Reviewed:    The following studies were reviewed today:  Bilateral Carotid Doppler  06/06/2022: Summary:  Right Carotid: Velocities in the right ICA are consistent with a 1-39%  stenosis.   Left Carotid: Velocities in the left ICA are consistent with a 1-39%  stenosis.   Vertebrals:  Bilateral vertebral arteries demonstrate antegrade flow.  Subclavians: Normal flow hemodynamics were seen in bilateral subclavian arteries.   Monitor 05/2022: 30 days of data recorded on [employer] monitor. Patient had a min HR of 47 bpm, max HR of 181 bpm, and avg HR of 72 bpm. Predominant underlying rhythm was Sinus Rhythm. No VT, SVT, atrial fibrillation, high degree block, or pauses noted. Isolated atrial and ventricular ectopy was rare (<1%). There were 6 triggered events, which were sinus rhythm/sinus tach. No significant arrhythmias detected.  CTA Head/Neck  04/12/2022: FINDINGS: CTA NECK FINDINGS   Aortic arch: Unremarkable   Right carotid system: Small mild plaque at the bifurcation without stenosis or ulceration    Left carotid system: Predominately calcified plaque at the ICA bulb. No stenosis or ulceration.   Vertebral arteries: No proximal subclavian stenosis. Dominant right vertebral artery. The vertebral arteries are smoothly contoured and diffusely patent   Skeleton: None   Other neck: Negative   Upper chest: Negative   Review of the MIP images confirms the above findings   CTA HEAD FINDINGS   Anterior circulation: Atheromatous calcification along the carotid siphons. No branch occlusion, beading, or aneurysm. High-grade right A1 origin Stenosis.   Posterior circulation: Dominant right vertebral artery. Fetal type bilateral PCA. At least moderate narrowing at the left P2 segment. Moderate mid basilar stenosis. No emergent branch occlusion, beading, or aneurysm.   Venous sinuses: Patent   Anatomic variants: As above   Review of the MIP images confirms the above findings   IMPRESSION: 1. No emergent finding. 2. Intracranial atherosclerosis with most notable stenoses affecting the mid basilar, left proximal PCA, and right A1 origin. 3. No flow limiting stenosis or embolic source seen in the neck.  Echocardiogram  04/07/2022: Sonographer Comments: Technically difficult study due to poor echo  windows, suboptimal apical window and suboptimal subcostal window. Patient  had trouble following directions. Study delayed 20 mins to  bathroom  patient.  IMPRESSIONS    1. Left ventricular ejection fraction, by estimation, is 60 to 65%. The  left ventricle has normal function. The left ventricle has no regional  wall motion abnormalities. Left ventricular diastolic parameters were  normal.   2. Right ventricular systolic function is normal. The right ventricular  size is normal. Tricuspid regurgitation signal is inadequate for assessing  PA pressure.   3. The mitral valve is grossly normal. Trivial mitral valve  regurgitation.   4. The aortic valve is tricuspid. Aortic valve  regurgitation is not  visualized.   5. The inferior vena cava is normal in size with greater than 50%  respiratory variability, suggesting right atrial pressure of 3 mmHg.   Comparison(s): No prior Echocardiogram.   EKG:  EKG is personally reviewed.   07/18/2022:  EKG was not ordered. 06/06/2022:  NSR at 83 bpm  Recent Labs: 04/07/2022: TSH 1.411 04/08/2022: Magnesium 1.9 04/17/2022: ALT 23 04/23/2022: Hemoglobin 15.0; Platelets 225 08/02/2022: BUN 18; Creatinine, Ser 1.12; Potassium 4.6; Sodium 141   Recent Lipid Panel    Component Value Date/Time   CHOL 153 04/07/2022 0900   TRIG 72 04/07/2022 0900   HDL 38 (L) 04/07/2022 0900   CHOLHDL 4.0 04/07/2022 0900   VLDL 14 04/07/2022 0900   LDLCALC 101 (H) 04/07/2022 0900    Physical Exam:    VS:  BP (!) 169/73 (BP Location: Left Arm, Patient Position: Sitting, Cuff Size: Normal)   Pulse 70   Ht 6' (1.829 m)   Wt 205 lb 4.8 oz (93.1 kg)   SpO2 99%   BMI 27.84 kg/m     Wt Readings from Last 3 Encounters:  09/18/22 205 lb 4.8 oz (93.1 kg)  07/18/22 205 lb 9.6 oz (93.3 kg)  06/21/22 206 lb 12.8 oz (93.8 kg)    GEN: Well nourished, well developed in no acute distress HEENT: Normal, moist mucous membranes NECK: No JVD CARDIAC: regular rhythm, normal S1 and S2, no rubs or gallops. No murmur. VASCULAR: Radial and DP pulses 2+ bilaterally. No carotid bruits RESPIRATORY:  Clear to auscultation without rales, wheezing or rhonchi  ABDOMEN: Soft, non-tender, non-distended MUSCULOSKELETAL:  Ambulates independently SKIN: Warm and dry, no edema NEUROLOGIC:  Alert and oriented x 3. No focal neuro deficits noted. PSYCHIATRIC:  Normal affect    ASSESSMENT:    1. Essential hypertension   2. History of CVA (cerebrovascular accident)   3. Cardiac risk counseling   4. Counseling on health promotion and disease prevention   5. Medication management    PLAN:    CVA Carotid stenosis Hypercholesterolemia -monitor: no evidence of atrial  fibrillation -echo: unremarkable -carotid ultrasounds: 1-39% bilateral carotid stenosis -ECG normal -completed DAPT, now on aspirin alone -on rosuvastatin 20 mg daily, has not yet been on statin for 2 months, has pending recheck in the future. LDL goal at least <70.  Hypertension -remains elevated today -continue amlodipine 10 mg daily -uptitrating valsartan, get repeat BMET in several weeks -beta blocker stopped in the hospital due to bradycardia  Cardiac risk counseling and prevention recommendations: -recommend heart healthy/Mediterranean diet, with whole grains, fruits, vegetable, fish, lean meats, nuts, and olive oil. Limit salt. -recommend moderate walking, 3-5 times/week for 30-50 minutes each session. Aim for at least 150 minutes.week. Goal should be pace of 3 miles/hours, or walking 1.5 miles in 30 minutes -recommend avoidance of tobacco products. Avoid excess alcohol.  Plan for follow up: 1-2 months or sooner as needed.   Jodelle Red,  MD, PhD, [facility]  [facility]    Medication Adjustments/Labs and Tests Ordered: Current medicines are reviewed at length with the patient today.  Concerns regarding medicines are outlined above.   Orders Placed This Encounter  Procedures   Basic metabolic panel   Meds ordered this encounter  Medications   DISCONTD: valsartan (DIOVAN) 80 MG tablet    Sig: Take 1 tablet (80 mg total) by mouth daily.   Patient Instructions  Medication Instructions:  We are going to increase your valsartan. You are currently on 40 mg daily.  Take 80 mg of valsartan daily. If blood pressure is consistently >140/90 after one week, increase to 160 mg daily.  Come and get bloodwork checked around week 2 to 3.  Call me after week 2 and tell me what dose you are taking so we can send it in to your pharmacy.   *If you need a refill on your cardiac medications before your next appointment, please call your pharmacy*   Lab Work: Your  provider has recommended lab work in 2-3 weeks (BMP). Please have this collected at [facility] [place]. The lab is open 8:00 am - 4:30 pm. Please avoid 12:00p - 1:00p for lunch hour. You do not need an appointment. Please go to [address] [place], [place] [address]. This is in the Primary Care office on the 3rd floor, let them know you are there for blood work and they will direct you to the lab.  If you have labs (blood work) drawn today and your tests are completely normal, you will receive your results only by: MyChart Message (if you have MyChart) OR A paper copy in the mail If you have any lab test that is abnormal or we need to change your treatment, we will call you to review the results.   Testing/Procedures: None ordered today   Follow-Up: At [facility], you and your health needs are our priority.  As part of our continuing mission to provide you with exceptional heart care, we have created designated Provider Care Teams.  These Care Teams include your primary Cardiologist (physician) and Advanced Practice Providers (APPs -  Physician Assistants and Nurse Practitioners) who all work together to provide you with the care you need, when you need it.  We recommend signing up for the patient portal called "MyChart".  Sign up information is provided on this After Visit Summary.  MyChart is used to connect with patients for Virtual Visits (Telemedicine).  Patients are able to view lab/test results, encounter notes, upcoming appointments, etc.  Non-urgent messages can be sent to your provider as well.   To learn more about what you can do with MyChart, go to [email or web address].    Your next appointment:   6 week(s)  The format for your next appointment:   In Person  Provider:   Jodelle Red, MD    .    Signed, Jodelle Red, MD PhD 09/18/2022     [facility]

## 2022-09-26 ENCOUNTER — Telehealth (HOSPITAL_BASED_OUTPATIENT_CLINIC_OR_DEPARTMENT_OTHER): Payer: Self-pay

## 2022-09-26 DIAGNOSIS — I10 Essential (primary) hypertension: Secondary | ICD-10-CM

## 2022-09-26 DIAGNOSIS — Z8673 Personal history of transient ischemic attack (TIA), and cerebral infarction without residual deficits: Secondary | ICD-10-CM

## 2022-09-26 MED ORDER — VALSARTAN 160 MG PO TABS: 160.0000 mg | ORAL_TABLET | Freq: Every day | ORAL | 11 refills | Status: DC

## 2022-09-26 NOTE — Telephone Encounter (Signed)
Pt called to report he's been taking Valsartan 80 mg as instructed but even after medication his BP averages around 140/70. Pt inquiring if MD would like him to continue with 80 mg or increase to 160 mg daily.  Per Dr. Harrell Gave, recommends increasing to 160 mg daily Pt made aware and verbalized understanding New Rx sent to the requested pharmacy.

## 2022-10-14 DIAGNOSIS — Z79899 Other long term (current) drug therapy: Secondary | ICD-10-CM | POA: Diagnosis not present

## 2022-10-14 LAB — BASIC METABOLIC PANEL
BUN/Creatinine Ratio: 15 (ref 10–24)
BUN: 18 mg/dL (ref 8–27)
CO2: 23 mmol/L (ref 20–29)
Calcium: 9.7 mg/dL (ref 8.6–10.2)
Chloride: 104 mmol/L (ref 96–106)
Creatinine, Ser: 1.2 mg/dL (ref 0.76–1.27)
Glucose: 87 mg/dL (ref 70–99)
Potassium: 5.2 mmol/L (ref 3.5–5.2)
Sodium: 142 mmol/L (ref 134–144)
eGFR: 66 mL/min/{1.73_m2} (ref >59)

## 2022-10-30 ENCOUNTER — Ambulatory Visit (INDEPENDENT_AMBULATORY_CARE_PROVIDER_SITE_OTHER): Payer: PPO | Admitting: Cardiology

## 2022-10-30 VITALS — BP 136/80 | HR 69 | Ht 72.0 in | Wt 208.8 lb

## 2022-10-30 DIAGNOSIS — E7800 Pure hypercholesterolemia, unspecified: Secondary | ICD-10-CM

## 2022-10-30 DIAGNOSIS — I10 Essential (primary) hypertension: Secondary | ICD-10-CM

## 2022-10-30 DIAGNOSIS — Z8673 Personal history of transient ischemic attack (TIA), and cerebral infarction without residual deficits: Secondary | ICD-10-CM | POA: Diagnosis not present

## 2022-10-30 DIAGNOSIS — Z7189 Other specified counseling: Secondary | ICD-10-CM | POA: Diagnosis not present

## 2022-10-30 MED ORDER — VALSARTAN 320 MG PO TABS: 320.0000 mg | ORAL_TABLET | Freq: Every day | ORAL | 3 refills | Status: DC

## 2022-10-30 NOTE — Patient Instructions (Signed)
Medication Instructions:   INCREASE Valsartan to 320 mg daily.  *If you need a refill on your cardiac medications before your next appointment, please call your pharmacy*  Follow-Up: At [facility], you and your health needs are our priority.  As part of our continuing mission to provide you with exceptional heart care, we have created designated Provider Care Teams.  These Care Teams include your primary Cardiologist (physician) and Advanced Practice Providers (APPs -  Physician Assistants and Nurse Practitioners) who all work together to provide you with the care you need, when you need it.  We recommend signing up for the patient portal called "MyChart".  Sign up information is provided on this After Visit Summary.  MyChart is used to connect with patients for Virtual Visits (Telemedicine).  Patients are able to view lab/test results, encounter notes, upcoming appointments, etc.  Non-urgent messages can be sent to your provider as well.   To learn more about what you can do with MyChart, go to [email or web address].    Your next appointment:   6 week(s)  The format for your next appointment:   In Person  Provider:   Jodelle Red, MD   Other Instructions

## 2022-10-30 NOTE — Progress Notes (Signed)
Cardiology Office Note:    Date:  10/30/2022   ID:  Ivan Lawson, DOB 09-13-1956, MRN [id number]  PCP:  Blair Heys, MD  Cardiologist:  Jodelle Red, MD  Referring MD: Blair Heys, MD   CC: Follow-up  History of Present Illness:    Ivan Lawson is a 66 y.o. male with a hx of CVA, hypertension, Grave's disease, hypothyroidism, hyperthyroidism, CKD stage IIIa, and prediabetes, who is seen for follow-up. He was initially seen 06/06/2022 as a new consult at the request of Blair Heys, MD for evaluation and management with discussion of monitor results following stroke.  Initially Ivan Lawson was admitted to the hospital 04/06/22 after presenting with new onset numbness to the fingers and toes of his right side. His work-up was suggestive of acute stroke and possible AKI. He was discharged on aspirin, Plavix, and statin combination, with Plavix to be stopped after 21 days. He was given a 30-day event monitor with cardiology follow-up. Due to resting bradycardia and renal insufficiency, his bisoprolol and HCTZ combination was discontinued. He was started on Norvasc with hydralazine combination.  He was re-admitted 04/12/2022 after presenting with concerns for right sided weakness and worsening dysarthria. An MRI confirmed evolution of his previously diagnosed stroke (left paramedian pontine infarct). CTA head and neck was significant for stenosis of mid basilar, left proximal PCA and right A1 origin. He was discharged on Brilinta/Aspirin for 30 days, followed by Plavix/Aspirin for 60 days, followed by Aspirin indefinitely. Also discharged on amlodipine 2.5 mg daily; hydralazine was discontinued.  Cardiovascular risk factors: Prior clinical ASCVD: 2 strokes in short succession as described above Comorbid conditions: Hypertension - Initially he had noticed his blood pressure was gradually increasing, as noted when he routinely donated blood. Prior to his stroke, his blood  pressure was averaging 135/60 at home. Since his stroke, he has noticed his blood pressure is now typically in the 150's-160's at home, confirmed in clinic today (150/80).    Metabolic syndrome/Obesity: BMI 28 Chronic inflammatory conditions: No. Tobacco use history: Never.  Family history: Both of his parents had minor strokes; his father died of a heart attack around 82 yo. He has one brother who also has issues with nephrolithiasis, no known cardiovascular issues. Prior cardiac testing and/or incidental findings on other testing (ie coronary calcium):  He reports he is scheduled for an ultrasound later today.  At his last visit, His BP was briefly normal, but subsequently increased. They ranged from 128/74 -170/78, and elevated at this visit. He took all of his pills together.  Today, he states he has always had high blood pressure for several years and that it had been difficult to control. The first 4 days on medication, it immediately went into 120/62 for four days. He also had low BP readings in the mornings without medicine. However, it gradually increased to approximately 150/80 - 170/74. During clinic today, his blood pressure reading is 136/80. He denies any lightheadedness on valsartan.   He sometimes feels tenderness in his pleuritic central chest pain. He also had some bruising. He notes that his father also experienced tenderness and bruising at his age.  He has been experiencing urinary frequency and some constipation.  For pain relief, he was started on tylenol at the hospital and discontinued on aleve. He described that aleve improved his symptoms much better than the tylenol, and would like to take one a day.   While in the hospital, he had low uric acid acid levels. However he had  an episode of gout shortly after.   For exercise, he regularly walks 10,000 steps a day during the evenings. For his diet, he regularly consumes pork and shrimp.  He notes that his memory isn't  great, but has the ability to retrieve it with time.  He denies any palpitations, shortness of breath, or peripheral edema. No lightheadedness, headaches, syncope, orthopnea, or PND.   Past Medical History:  Diagnosis Date   Hyperthyroidism     Past Surgical History:  Procedure Laterality Date   BACK SURGERY  1990   FINGER SURGERY      Current Medications: Current Outpatient Medications on File Prior to Visit  Medication Sig   acetaminophen (TYLENOL) 325 MG tablet Take 1-2 tablets (325-650 mg total) by mouth every 4 (four) hours as needed for mild pain.   amLODipine (NORVASC) 10 MG tablet Take 1 tablet (10 mg total) by mouth daily.   aspirin 81 MG EC tablet Take 1 tablet (81 mg total) by mouth daily. Swallow whole.   diclofenac Sodium (VOLTAREN) 1 % GEL Apply 2 g topically 4 (four) times daily.   levothyroxine (SYNTHROID) 137 MCG tablet Take 137 mcg by mouth every morning.   methocarbamol (ROBAXIN) 500 MG tablet Take 1 tablet (500 mg total) by mouth every 6 (six) hours as needed for muscle spasms.   rosuvastatin (CRESTOR) 20 MG tablet Take 1 tablet (20 mg total) by mouth daily.   vitamin B-12 (CYANOCOBALAMIN) 1000 MCG tablet Take 1,000 mcg by mouth daily.   No current facility-administered medications on file prior to visit.     Allergies:   Benadryl [diphenhydramine]   Social History   Tobacco Use   Smoking status: Never   Smokeless tobacco: Never  Vaping Use   Vaping Use: Never used  Substance Use Topics   Alcohol use: Not Currently   Drug use: Not Currently    Family History: family history is negative for Thyroid disease.  ROS:   Please see the history of present illness. (+) constipation (+) pleuritic central chest tenderness (+) urinary frequency All other systems are reviewed and negative.    EKGs/Labs/Other Studies Reviewed:    The following studies were reviewed today:  Bilateral Carotid Doppler  06/06/2022: Summary:  Right Carotid: Velocities in  the right ICA are consistent with a 1-39%  stenosis.   Left Carotid: Velocities in the left ICA are consistent with a 1-39%  stenosis.   Vertebrals:  Bilateral vertebral arteries demonstrate antegrade flow.  Subclavians: Normal flow hemodynamics were seen in bilateral subclavian arteries.   Venous Doppler 06/06/2022: Summary:  Right Carotid: Velocities in the right ICA are consistent with a 1-39%  stenosis.   Left Carotid: Velocities in the left ICA are consistent with a 1-39%  stenosis.   Vertebrals: Bilateral vertebral arteries demonstrate antegrade flow.  Subclavians: Normal flow hemodynamics were seen in bilateral subclavian               arteries.   Monitor 05/2022: 30 days of data recorded on [employer] monitor. Patient had a min HR of 47 bpm, max HR of 181 bpm, and avg HR of 72 bpm. Predominant underlying rhythm was Sinus Rhythm. No VT, SVT, atrial fibrillation, high degree block, or pauses noted. Isolated atrial and ventricular ectopy was rare (<1%). There were 6 triggered events, which were sinus rhythm/sinus tach. No significant arrhythmias detected.  CTA Head/Neck  04/12/2022: FINDINGS: CTA NECK FINDINGS   Aortic arch: Unremarkable   Right carotid system: Small mild plaque at the  bifurcation without stenosis or ulceration   Left carotid system: Predominately calcified plaque at the ICA bulb. No stenosis or ulceration.   Vertebral arteries: No proximal subclavian stenosis. Dominant right vertebral artery. The vertebral arteries are smoothly contoured and diffusely patent   Skeleton: None   Other neck: Negative   Upper chest: Negative   Review of the MIP images confirms the above findings   CTA HEAD FINDINGS   Anterior circulation: Atheromatous calcification along the carotid siphons. No branch occlusion, beading, or aneurysm. High-grade right A1 origin Stenosis.   Posterior circulation: Dominant right vertebral artery. Fetal type bilateral PCA. At least  moderate narrowing at the left P2 segment. Moderate mid basilar stenosis. No emergent branch occlusion, beading, or aneurysm.   Venous sinuses: Patent   Anatomic variants: As above   Review of the MIP images confirms the above findings   IMPRESSION: 1. No emergent finding. 2. Intracranial atherosclerosis with most notable stenoses affecting the mid basilar, left proximal PCA, and right A1 origin. 3. No flow limiting stenosis or embolic source seen in the neck.  Echocardiogram  04/07/2022: Sonographer Comments: Technically difficult study due to poor echo  windows, suboptimal apical window and suboptimal subcostal window. Patient  had trouble following directions. Study delayed 20 mins to bathroom  patient.  IMPRESSIONS    1. Left ventricular ejection fraction, by estimation, is 60 to 65%. The  left ventricle has normal function. The left ventricle has no regional  wall motion abnormalities. Left ventricular diastolic parameters were  normal.   2. Right ventricular systolic function is normal. The right ventricular  size is normal. Tricuspid regurgitation signal is inadequate for assessing  PA pressure.   3. The mitral valve is grossly normal. Trivial mitral valve  regurgitation.   4. The aortic valve is tricuspid. Aortic valve regurgitation is not  visualized.   5. The inferior vena cava is normal in size with greater than 50%  respiratory variability, suggesting right atrial pressure of 3 mmHg.   Comparison(s): No prior Echocardiogram.   EKG:  EKG is personally reviewed.   10/30/2022: NSR at 69 bpm 07/18/2022:  EKG was not ordered. 06/06/2022:  NSR at 83 bpm  Recent Labs: 04/07/2022: TSH 1.411 04/08/2022: Magnesium 1.9 04/17/2022: ALT 23 04/23/2022: Hemoglobin 15.0; Platelets 225 10/14/2022: BUN 18; Creatinine, Ser 1.20; Potassium 5.2; Sodium 142   Recent Lipid Panel    Component Value Date/Time   CHOL 153 04/07/2022 0900   TRIG 72 04/07/2022 0900   HDL 38 (L)  04/07/2022 0900   CHOLHDL 4.0 04/07/2022 0900   VLDL 14 04/07/2022 0900   LDLCALC 101 (H) 04/07/2022 0900    Physical Exam:    VS:  BP 136/80   Pulse 69   Ht 6' (1.829 m)   Wt 208 lb 12.8 oz (94.7 kg)   SpO2 98%   BMI 28.32 kg/m     Wt Readings from Last 3 Encounters:  10/30/22 208 lb 12.8 oz (94.7 kg)  09/18/22 205 lb 4.8 oz (93.1 kg)  07/18/22 205 lb 9.6 oz (93.3 kg)    GEN: Well nourished, well developed in no acute distress HEENT: Normal, moist mucous membranes NECK: No JVD CARDIAC: regular rhythm, normal S1 and S2, no rubs or gallops. No murmur. VASCULAR: Radial and DP pulses 2+ bilaterally. No carotid bruits RESPIRATORY:  Clear to auscultation without rales, wheezing or rhonchi  ABDOMEN: Soft, non-tender, non-distended MUSCULOSKELETAL:  Ambulates independently SKIN: Warm and dry, no edema NEUROLOGIC:  Alert and oriented x 3.  No focal neuro deficits noted. PSYCHIATRIC:  Normal affect    ASSESSMENT:    1. Essential hypertension   2. History of CVA (cerebrovascular accident)   3. Pure hypercholesterolemia   4. Cardiac risk counseling   5. Counseling on health promotion and disease prevention    PLAN:    CVA Carotid stenosis Hypercholesterolemia -monitor: no evidence of atrial fibrillation -echo: unremarkable -carotid ultrasounds: 1-39% bilateral carotid stenosis -ECG normal -completed DAPT, now on aspirin alone -on rosuvastatin 20 mg daily, LDL goal at least <70.  Hypertension -remains elevated today, though improved from prior -continue amlodipine 5 mg daily -ordered valsartan 320 mg daily -close follow up of BP  Cardiac risk counseling and prevention recommendations: -recommend heart healthy/Mediterranean diet, with whole grains, fruits, vegetable, fish, lean meats, nuts, and olive oil. Limit salt. -recommend moderate walking, 3-5 times/week for 30-50 minutes each session. Aim for at least 150 minutes.week. Goal should be pace of 3 miles/hours, or  walking 1.5 miles in 30 minutes -recommend avoidance of tobacco products. Avoid excess alcohol.  Plan for follow up: 6 weeks.  Jodelle Red, MD, PhD, [facility]  [facility]    Medication Adjustments/Labs and Tests Ordered: Current medicines are reviewed at length with the patient today.  Concerns regarding medicines are outlined above.   Orders Placed This Encounter  Procedures   EKG 12-Lead   Meds ordered this encounter  Medications   valsartan (DIOVAN) 320 MG tablet    Sig: Take 1 tablet (320 mg total) by mouth daily.    Dispense:  90 tablet    Refill:  3   Patient Instructions  Medication Instructions:   INCREASE Valsartan to 320 mg daily.  *If you need a refill on your cardiac medications before your next appointment, please call your pharmacy*  Follow-Up: At [facility], you and your health needs are our priority.  As part of our continuing mission to provide you with exceptional heart care, we have created designated Provider Care Teams.  These Care Teams include your primary Cardiologist (physician) and Advanced Practice Providers (APPs -  Physician Assistants and Nurse Practitioners) who all work together to provide you with the care you need, when you need it.  We recommend signing up for the patient portal called "MyChart".  Sign up information is provided on this After Visit Summary.  MyChart is used to connect with patients for Virtual Visits (Telemedicine).  Patients are able to view lab/test results, encounter notes, upcoming appointments, etc.  Non-urgent messages can be sent to your provider as well.   To learn more about what you can do with MyChart, go to [email or web address].    Your next appointment:   6 week(s)  The format for your next appointment:   In Person  Provider:   Jodelle Red, MD   Other Instructions         I,Mitra Faeizi,acting as a scribe for Jodelle Red, MD.,have documented all  relevant documentation on the behalf of Jodelle Red, MD,as directed by  Jodelle Red, MD while in the presence of Jodelle Red, MD.   I, Jodelle Red, MD, have reviewed all documentation for this visit. The documentation on 01/05/23 for the exam, diagnosis, procedures, and orders are all accurate and complete.    Signed, Jodelle Red, MD PhD 10/30/2022     [facility]

## 2022-11-13 DIAGNOSIS — Z8673 Personal history of transient ischemic attack (TIA), and cerebral infarction without residual deficits: Secondary | ICD-10-CM | POA: Diagnosis not present

## 2022-11-13 DIAGNOSIS — Z86010 Personal history of colonic polyps: Secondary | ICD-10-CM | POA: Diagnosis not present

## 2022-11-18 DIAGNOSIS — I6990 Unspecified sequelae of unspecified cerebrovascular disease: Secondary | ICD-10-CM | POA: Diagnosis not present

## 2022-11-18 DIAGNOSIS — Z8673 Personal history of transient ischemic attack (TIA), and cerebral infarction without residual deficits: Secondary | ICD-10-CM | POA: Diagnosis not present

## 2022-11-18 DIAGNOSIS — E0500 Thyrotoxicosis with diffuse goiter without thyrotoxic crisis or storm: Secondary | ICD-10-CM | POA: Diagnosis not present

## 2022-11-18 DIAGNOSIS — Z0000 Encounter for general adult medical examination without abnormal findings: Secondary | ICD-10-CM | POA: Diagnosis not present

## 2022-11-18 DIAGNOSIS — R7303 Prediabetes: Secondary | ICD-10-CM | POA: Diagnosis not present

## 2022-11-18 DIAGNOSIS — E538 Deficiency of other specified B group vitamins: Secondary | ICD-10-CM | POA: Diagnosis not present

## 2022-11-18 DIAGNOSIS — Z1331 Encounter for screening for depression: Secondary | ICD-10-CM | POA: Diagnosis not present

## 2022-11-18 DIAGNOSIS — Z125 Encounter for screening for malignant neoplasm of prostate: Secondary | ICD-10-CM | POA: Diagnosis not present

## 2022-11-18 DIAGNOSIS — E785 Hyperlipidemia, unspecified: Secondary | ICD-10-CM | POA: Diagnosis not present

## 2022-11-18 DIAGNOSIS — E890 Postprocedural hypothyroidism: Secondary | ICD-10-CM | POA: Diagnosis not present

## 2022-11-18 DIAGNOSIS — H9190 Unspecified hearing loss, unspecified ear: Secondary | ICD-10-CM | POA: Diagnosis not present

## 2022-11-18 DIAGNOSIS — I10 Essential (primary) hypertension: Secondary | ICD-10-CM | POA: Diagnosis not present

## 2022-11-21 ENCOUNTER — Other Ambulatory Visit: Payer: Self-pay | Admitting: Neurology

## 2022-11-21 NOTE — Progress Notes (Signed)
Patient has recent fasting lipid done on November 19, 2022 Cholesterol 99 Triglyceride 83 HDL 50 LDL 32 Non-HDL 49 Cholesterol/HDL ratio 2.0

## 2022-11-26 ENCOUNTER — Telehealth (HOSPITAL_BASED_OUTPATIENT_CLINIC_OR_DEPARTMENT_OTHER): Payer: Self-pay | Admitting: Cardiology

## 2022-11-26 DIAGNOSIS — Z5181 Encounter for therapeutic drug level monitoring: Secondary | ICD-10-CM

## 2022-11-26 DIAGNOSIS — I10 Essential (primary) hypertension: Secondary | ICD-10-CM

## 2022-11-26 NOTE — Telephone Encounter (Signed)
Patient walked in stating he needed paperwork to get lab work today.  There are no current lab orders in EPIC.  Most recent  lab work was completed in November of this year  Patient states he was told by Dr. Cristal Deer since she changed some of his medications, he should have repeat labs in about 4 weeks.  Please advise

## 2022-11-26 NOTE — Telephone Encounter (Signed)
Call to patient to let him know there are no current lab orders in EPIC and notes do not indicate need for additional lab work.  Call to patient to let him know message was forwarded to Dr. Cristal Deer. Pt aware Dr. Cristal Deer is out of the office this week. Ivan Lawson MHA RN CCM

## 2022-11-29 NOTE — Telephone Encounter (Signed)
Valsartan dose increased at most recent OV. Recommend monitoring of renal function. Recommend BMP. He may have it done next week or we can do it at the time of his 12/11/22 appt if he prefers.   Alver Sorrow, NP

## 2022-12-03 NOTE — Telephone Encounter (Signed)
Spoke with patient who stated he would prefer just getting at follow up visit 1/10

## 2022-12-11 ENCOUNTER — Encounter (HOSPITAL_BASED_OUTPATIENT_CLINIC_OR_DEPARTMENT_OTHER): Payer: Self-pay | Admitting: Cardiology

## 2022-12-11 ENCOUNTER — Ambulatory Visit (HOSPITAL_BASED_OUTPATIENT_CLINIC_OR_DEPARTMENT_OTHER): Payer: PPO | Admitting: Cardiology

## 2022-12-11 VITALS — BP 148/86 | HR 90 | Ht 72.0 in | Wt 205.2 lb

## 2022-12-11 DIAGNOSIS — I10 Essential (primary) hypertension: Secondary | ICD-10-CM | POA: Diagnosis not present

## 2022-12-11 DIAGNOSIS — Z5181 Encounter for therapeutic drug level monitoring: Secondary | ICD-10-CM | POA: Diagnosis not present

## 2022-12-11 DIAGNOSIS — Z8673 Personal history of transient ischemic attack (TIA), and cerebral infarction without residual deficits: Secondary | ICD-10-CM | POA: Diagnosis not present

## 2022-12-11 DIAGNOSIS — Z7189 Other specified counseling: Secondary | ICD-10-CM

## 2022-12-11 DIAGNOSIS — E7800 Pure hypercholesterolemia, unspecified: Secondary | ICD-10-CM | POA: Diagnosis not present

## 2022-12-11 MED ORDER — BISOPROLOL FUMARATE 5 MG PO TABS: 2.5000 mg | ORAL_TABLET | Freq: Every day | ORAL | 11 refills | Status: DC

## 2022-12-11 NOTE — Progress Notes (Signed)
Cardiology Office Note:    Date:  12/12/2022   ID:  Ivan Lawson, DOB January 27, 1956, MRN [id number]  PCP:  Ivan Arabian, MD  Cardiologist:  Ivan Dresser, MD  Referring MD: Ivan Arabian, MD   CC: Follow-up  History of Present Illness:    Ivan Lawson is a 67 y.o. male with a hx of CVA, hypertension, Grave's disease, hypothyroidism, hyperthyroidism, CKD stage IIIa, and prediabetes, who is seen for follow-up. He was initially seen 06/06/2022 as a new consult at the request of Ivan Arabian, MD for evaluation and management with discussion of monitor results following stroke.  Initially Ivan Lawson was admitted to the hospital 04/06/22 after presenting with new onset numbness to the fingers and toes of his right side. His work-up was suggestive of acute stroke and possible AKI. He was discharged on aspirin, Plavix, and statin combination, with Plavix to be stopped after 21 days. He was given a 30-day event monitor with cardiology follow-up. Due to resting bradycardia and renal insufficiency, his bisoprolol and HCTZ combination was discontinued while admitted. He was started on Norvasc with hydralazine combination.  He was re-admitted 04/12/2022 after presenting with concerns for right sided weakness and worsening dysarthria. An MRI confirmed evolution of his previously diagnosed stroke (left paramedian pontine infarct). CTA head and neck was significant for stenosis of mid basilar, left proximal PCA and right A1 origin. He was discharged on Brilinta/Aspirin for 30 days, followed by Plavix/Aspirin for 60 days, followed by Aspirin indefinitely. Also discharged on amlodipine 2.5 mg daily; hydralazine was discontinued.  Cardiovascular risk factors: Prior clinical ASCVD: 2 strokes in short succession as described above Comorbid conditions: Hypertension - Initially he had noticed his blood pressure was gradually increasing, as noted when he routinely donated blood. Prior to his stroke,  his blood pressure was averaging 135/60 at home. Since his stroke, he has noticed his blood pressure is now typically in the 150's-160's at home, confirmed in clinic today (150/80).    Metabolic syndrome/Obesity: BMI 28 Chronic inflammatory conditions: No. Tobacco use history: Never.  Family history: Both of his parents had minor strokes; his father died of a heart attack around 60 yo. He has one brother who also has issues with nephrolithiasis, no known cardiovascular issues. Prior cardiac testing and/or incidental findings on other testing (ie coronary calcium):  He reports he is scheduled for an ultrasound later today.  At his last appointment he reported struggling with hypertension for several years. His BP gradually increased to a range of 150/80 to 170/74. In clinic BP was 136/80. He complained of occasional pleuritic chest pain, urinary frequency, and some constipation. Valsartan was increased to 320 mg.  Today, he is generally feeling well. However, he states that his blood pressures have not improved at all with his increased dose of Valsartan. Typically, every morning his blood pressure has still been around 150/80, with heart rates in the 50s. Very rarely he has normal readings, such as 128/65 this morning. Later in the day his heart rate will increase to the 60s-70s.  Recently he went to give blood, and his blood pressure was 120/82. Near the end, he developed a vagal response to needle manipulation in his blood vessel. He suddenly felt very dizzy and lightheaded but didn't lose consciousness. After sitting still for 2-3 minutes his symptoms subsided. No further episodes.  He denies any palpitations, chest pain, shortness of breath, or peripheral edema. No headaches, orthopnea, or PND.   Past Medical History:  Diagnosis Date  Hyperthyroidism     Past Surgical History:  Procedure Laterality Date   BACK SURGERY  1990   FINGER SURGERY      Current Medications: Current  Outpatient Medications on File Prior to Visit  Medication Sig   acetaminophen (TYLENOL) 325 MG tablet Take 1-2 tablets (325-650 mg total) by mouth every 4 (four) hours as needed for mild pain.   amLODipine (NORVASC) 10 MG tablet Take 1 tablet (10 mg total) by mouth daily.   aspirin 81 MG EC tablet Take 1 tablet (81 mg total) by mouth daily. Swallow whole.   diclofenac Sodium (VOLTAREN) 1 % GEL Apply 2 g topically 4 (four) times daily.   levothyroxine (SYNTHROID) 125 MCG tablet Take 125 mcg by mouth every morning.   methocarbamol (ROBAXIN) 500 MG tablet Take 1 tablet (500 mg total) by mouth every 6 (six) hours as needed for muscle spasms.   rosuvastatin (CRESTOR) 20 MG tablet Take 1 tablet (20 mg total) by mouth daily.   valsartan (DIOVAN) 320 MG tablet Take 1 tablet (320 mg total) by mouth daily.   vitamin B-12 (CYANOCOBALAMIN) 1000 MCG tablet Take 1,000 mcg by mouth daily.   No current facility-administered medications on file prior to visit.     Allergies:   Benadryl [diphenhydramine]   Social History   Tobacco Use   Smoking status: Never   Smokeless tobacco: Never  Vaping Use   Vaping Use: Never used  Substance Use Topics   Alcohol use: Not Currently   Drug use: Not Currently    Family History: family history is negative for Thyroid disease.  ROS:   Please see the history of present illness. (+) Dizziness secondary to vagal response All other systems are reviewed and negative.    EKGs/Labs/Other Studies Reviewed:    The following studies were reviewed today:  Bilateral Carotid Doppler  06/06/2022: Summary:  Right Carotid: Velocities in the right ICA are consistent with a 1-39%  stenosis.   Left Carotid: Velocities in the left ICA are consistent with a 1-39%  stenosis.   Vertebrals:  Bilateral vertebral arteries demonstrate antegrade flow.  Subclavians: Normal flow hemodynamics were seen in bilateral subclavian arteries.   Venous Doppler 06/06/2022: Summary:   Right Carotid: Velocities in the right ICA are consistent with a 1-39%  stenosis.   Left Carotid: Velocities in the left ICA are consistent with a 1-39%  stenosis.   Vertebrals: Bilateral vertebral arteries demonstrate antegrade flow.  Subclavians: Normal flow hemodynamics were seen in bilateral subclavian               arteries.   Monitor 05/2022: 30 days of data recorded on [employer] monitor. Patient had a min HR of 47 bpm, max HR of 181 bpm, and avg HR of 72 bpm. Predominant underlying rhythm was Sinus Rhythm. No VT, SVT, atrial fibrillation, high degree block, or pauses noted. Isolated atrial and ventricular ectopy was rare (<1%). There were 6 triggered events, which were sinus rhythm/sinus tach. No significant arrhythmias detected.  CTA Head/Neck  04/12/2022: FINDINGS: CTA NECK FINDINGS   Aortic arch: Unremarkable   Right carotid system: Small mild plaque at the bifurcation without stenosis or ulceration   Left carotid system: Predominately calcified plaque at the ICA bulb. No stenosis or ulceration.   Vertebral arteries: No proximal subclavian stenosis. Dominant right vertebral artery. The vertebral arteries are smoothly contoured and diffusely patent   Skeleton: None   Other neck: Negative   Upper chest: Negative   Review of the MIP  images confirms the above findings   CTA HEAD FINDINGS   Anterior circulation: Atheromatous calcification along the carotid siphons. No branch occlusion, beading, or aneurysm. High-grade right A1 origin Stenosis.   Posterior circulation: Dominant right vertebral artery. Fetal type bilateral PCA. At least moderate narrowing at the left P2 segment. Moderate mid basilar stenosis. No emergent branch occlusion, beading, or aneurysm.   Venous sinuses: Patent   Anatomic variants: As above   Review of the MIP images confirms the above findings   IMPRESSION: 1. No emergent finding. 2. Intracranial atherosclerosis with most notable  stenoses affecting the mid basilar, left proximal PCA, and right A1 origin. 3. No flow limiting stenosis or embolic source seen in the neck.  Echocardiogram  04/07/2022: Sonographer Comments: Technically difficult study due to poor echo  windows, suboptimal apical window and suboptimal subcostal window. Patient  had trouble following directions. Study delayed 20 mins to bathroom  patient.   IMPRESSIONS   1. Left ventricular ejection fraction, by estimation, is 60 to 65%. The  left ventricle has normal function. The left ventricle has no regional  wall motion abnormalities. Left ventricular diastolic parameters were  normal.   2. Right ventricular systolic function is normal. The right ventricular  size is normal. Tricuspid regurgitation signal is inadequate for assessing  PA pressure.   3. The mitral valve is grossly normal. Trivial mitral valve  regurgitation.   4. The aortic valve is tricuspid. Aortic valve regurgitation is not  visualized.   5. The inferior vena cava is normal in size with greater than 50%  respiratory variability, suggesting right atrial pressure of 3 mmHg.   Comparison(s): No prior Echocardiogram.   EKG:  EKG is personally reviewed.   12/11/2022:  EKG was not ordered. 10/30/2022: EKG was not ordered. 07/18/2022:  EKG was not ordered. 06/06/2022:  NSR at 83 bpm  Recent Labs: 04/07/2022: TSH 1.411 04/08/2022: Magnesium 1.9 04/17/2022: ALT 23 04/23/2022: Hemoglobin 15.0; Platelets 225 12/11/2022: BUN 19; Creatinine, Ser 1.27; Potassium 4.4; Sodium 144   Recent Lipid Panel    Component Value Date/Time   CHOL 153 04/07/2022 0900   TRIG 72 04/07/2022 0900   HDL 38 (L) 04/07/2022 0900   CHOLHDL 4.0 04/07/2022 0900   VLDL 14 04/07/2022 0900   LDLCALC 101 (H) 04/07/2022 0900    Physical Exam:    VS:  BP (!) 148/86 (BP Location: Right Arm, Patient Position: Sitting, Cuff Size: Normal)   Pulse 90   Ht 6' (1.829 m)   Wt 205 lb 3.2 oz (93.1 kg)   SpO2 98%   BMI  27.83 kg/m     Wt Readings from Last 3 Encounters:  12/11/22 205 lb 3.2 oz (93.1 kg)  10/30/22 208 lb 12.8 oz (94.7 kg)  09/18/22 205 lb 4.8 oz (93.1 kg)    GEN: Well nourished, well developed in no acute distress HEENT: Normal, moist mucous membranes NECK: No JVD CARDIAC: regular rhythm, normal S1 and S2, no rubs or gallops. No murmur. VASCULAR: Radial and DP pulses 2+ bilaterally. No carotid bruits RESPIRATORY:  Clear to auscultation without rales, wheezing or rhonchi  ABDOMEN: Soft, non-tender, non-distended MUSCULOSKELETAL:  Ambulates independently SKIN: Warm and dry, no edema NEUROLOGIC:  Alert and oriented x 3. No focal neuro deficits noted. PSYCHIATRIC:  Normal affect    ASSESSMENT:    1. Essential hypertension   2. History of CVA (cerebrovascular accident)   3. Pure hypercholesterolemia   4. Cardiac risk counseling   5. Counseling on health promotion  and disease prevention     PLAN:    CVA Carotid stenosis Hypercholesterolemia -monitor: no evidence of atrial fibrillation -echo: unremarkable -carotid ultrasounds: 1-39% bilateral carotid stenosis -ECG normal -completed DAPT, now on aspirin alone -on rosuvastatin 20 mg daily, LDL 32  Hypertension -remains elevated today -continue amlodipine 10 mg daily -continue valsartan 320 mg. He did not noticed significant improvement with uptitration of this. -Bisoprolol stopped 2/2 bradycardia while in the hospital 04/2022, as low as 44 bpm. Was on 5 mg bisoprolol. He reports that he felt well on it before. Will restart at 2.5 mg dose, monitor HR/BP closely  Cardiac risk counseling and prevention recommendations: -recommend heart healthy/Mediterranean diet, with whole grains, fruits, vegetable, fish, lean meats, nuts, and olive oil. Limit salt. -recommend moderate walking, 3-5 times/week for 30-50 minutes each session. Aim for at least 150 minutes.week. Goal should be pace of 3 miles/hours, or walking 1.5 miles in 30  minutes -recommend avoidance of tobacco products. Avoid excess alcohol.  Plan for follow up: 6-8 weeks, or sooner as needed.  Ivan Dresser, MD, PhD, [facility]    Medication Adjustments/Labs and Tests Ordered: Current medicines are reviewed at length with the patient today.  Concerns regarding medicines are outlined above.   No orders of the defined types were placed in this encounter.  Meds ordered this encounter  Medications   bisoprolol (ZEBETA) 5 MG tablet    Sig: Take 0.5 tablets (2.5 mg total) by mouth daily.    Dispense:  15 tablet    Refill:  11   Patient Instructions  Medication Instructions:  START BISOPROLOL 5 MG 1/2 TABLET DAILY   Labwork: NONE  Testing/Procedures: NONE  Follow-Up: 01/22/2023 4:00 PM WITH DR Mahek Schlesinger      I,Mathew Stumpf,acting as a scribe for Ivan Dresser, MD.,have documented all relevant documentation on the behalf of Ivan Dresser, MD,as directed by  Ivan Dresser, MD while in the presence of Ivan Dresser, MD.  I, Ivan Dresser, MD, have reviewed all documentation for this visit. The documentation on 12/12/22 for the exam, diagnosis, procedures, and orders are all accurate and complete.    Signed, Ivan Dresser, MD PhD 12/12/2022     [place]

## 2022-12-11 NOTE — Addendum Note (Signed)
Addended by: Alvina Filbert B on: 12/11/2022 03:14 PM   Modules accepted: Orders

## 2022-12-11 NOTE — Patient Instructions (Addendum)
Medication Instructions:  START BISOPROLOL 5 MG 1/2 TABLET DAILY   Labwork: NONE  Testing/Procedures: NONE  Follow-Up: 01/22/2023 4:00 PM WITH DR Harrell Gave

## 2022-12-12 ENCOUNTER — Encounter (HOSPITAL_BASED_OUTPATIENT_CLINIC_OR_DEPARTMENT_OTHER): Payer: Self-pay | Admitting: Cardiology

## 2022-12-12 LAB — BASIC METABOLIC PANEL
BUN/Creatinine Ratio: 15 (ref 10–24)
BUN: 19 mg/dL (ref 8–27)
CO2: 21 mmol/L (ref 20–29)
Calcium: 9.7 mg/dL (ref 8.6–10.2)
Chloride: 107 mmol/L — ABNORMAL HIGH (ref 96–106)
Creatinine, Ser: 1.27 mg/dL (ref 0.76–1.27)
Glucose: 86 mg/dL (ref 70–99)
Potassium: 4.4 mmol/L (ref 3.5–5.2)
Sodium: 144 mmol/L (ref 134–144)
eGFR: 62 mL/min/{1.73_m2} (ref >59)

## 2022-12-22 ENCOUNTER — Encounter (HOSPITAL_BASED_OUTPATIENT_CLINIC_OR_DEPARTMENT_OTHER): Payer: Self-pay | Admitting: Cardiology

## 2022-12-31 DIAGNOSIS — Z86010 Personal history of colonic polyps: Secondary | ICD-10-CM | POA: Diagnosis not present

## 2022-12-31 DIAGNOSIS — D122 Benign neoplasm of ascending colon: Secondary | ICD-10-CM | POA: Diagnosis not present

## 2022-12-31 DIAGNOSIS — Z09 Encounter for follow-up examination after completed treatment for conditions other than malignant neoplasm: Secondary | ICD-10-CM | POA: Diagnosis not present

## 2022-12-31 DIAGNOSIS — D120 Benign neoplasm of cecum: Secondary | ICD-10-CM | POA: Diagnosis not present

## 2022-12-31 DIAGNOSIS — K648 Other hemorrhoids: Secondary | ICD-10-CM | POA: Diagnosis not present

## 2023-01-05 ENCOUNTER — Encounter (HOSPITAL_BASED_OUTPATIENT_CLINIC_OR_DEPARTMENT_OTHER): Payer: Self-pay | Admitting: Cardiology

## 2023-01-22 ENCOUNTER — Encounter (HOSPITAL_BASED_OUTPATIENT_CLINIC_OR_DEPARTMENT_OTHER): Payer: Self-pay | Admitting: Cardiology

## 2023-01-22 ENCOUNTER — Ambulatory Visit (INDEPENDENT_AMBULATORY_CARE_PROVIDER_SITE_OTHER): Payer: PPO | Admitting: Cardiology

## 2023-01-22 VITALS — BP 142/92 | HR 54 | Ht 72.0 in | Wt 205.4 lb

## 2023-01-22 DIAGNOSIS — I6523 Occlusion and stenosis of bilateral carotid arteries: Secondary | ICD-10-CM

## 2023-01-22 DIAGNOSIS — I10 Essential (primary) hypertension: Secondary | ICD-10-CM

## 2023-01-22 DIAGNOSIS — Z8673 Personal history of transient ischemic attack (TIA), and cerebral infarction without residual deficits: Secondary | ICD-10-CM | POA: Diagnosis not present

## 2023-01-22 DIAGNOSIS — Z79899 Other long term (current) drug therapy: Secondary | ICD-10-CM

## 2023-01-22 DIAGNOSIS — E7800 Pure hypercholesterolemia, unspecified: Secondary | ICD-10-CM

## 2023-01-22 MED ORDER — CHLORTHALIDONE 25 MG PO TABS: 25.0000 mg | ORAL_TABLET | Freq: Every day | ORAL | 11 refills | Status: DC

## 2023-01-22 NOTE — Progress Notes (Signed)
Cardiology Office Note:    Date:  01/23/2023   ID:  Mallie, Carosella 1956-11-22, MRN [id number]  PCP:  Gaynelle Arabian, MD  Cardiologist:  Buford Dresser, MD  Referring MD: Gaynelle Arabian, MD   CC: Follow-up  History of Present Illness:    Ivan Lawson is a 67 y.o. male with a hx of CVA, hypertension, Grave's disease, hypothyroidism, hyperthyroidism, CKD stage IIIa, and prediabetes, who is seen for follow-up. He was initially seen 06/06/2022 as a new consult at the request of Gaynelle Arabian, MD for evaluation and management with discussion of monitor results following stroke.  Initially Mr. Troeger was admitted to the hospital 04/06/22 after presenting with new onset numbness to the fingers and toes of his right side. His work-up was suggestive of acute stroke and possible AKI. He was discharged on aspirin, Plavix, and statin combination, with Plavix to be stopped after 21 days. He was given a 30-day event monitor with cardiology follow-up. Due to resting bradycardia and renal insufficiency, his bisoprolol and HCTZ combination was discontinued while admitted. He was started on Norvasc with hydralazine combination.  He was re-admitted 04/12/2022 after presenting with concerns for right sided weakness and worsening dysarthria. An MRI confirmed evolution of his previously diagnosed stroke (left paramedian pontine infarct). CTA head and neck was significant for stenosis of mid basilar, left proximal PCA and right A1 origin. He was discharged on Brilinta/Aspirin for 30 days, followed by Plavix/Aspirin for 60 days, followed by Aspirin indefinitely. Also discharged on amlodipine 2.5 mg daily; hydralazine was discontinued.  Cardiovascular risk factors: Prior clinical ASCVD: 2 strokes in short succession as described above Comorbid conditions: Hypertension - Initially he had noticed his blood pressure was gradually increasing, as noted when he routinely donated blood. Prior to his stroke,  his blood pressure was averaging 135/60 at home. Since his stroke, he has noticed his blood pressure is now typically in the 150's-160's at home, confirmed in clinic today (150/80).    Metabolic syndrome/Obesity: BMI 28 Chronic inflammatory conditions: No. Tobacco use history: Never.  Family history: Both of his parents had minor strokes; his father died of a heart attack around 57 yo. He has one brother who also has issues with nephrolithiasis, no known cardiovascular issues. Prior cardiac testing and/or incidental findings on other testing (ie coronary calcium):  He reports he is scheduled for an ultrasound later today.  At his last appointment he reported struggling with hypertension for several years. His BP gradually increased to a range of 150/80 to 170/74. In clinic BP was 136/80. He complained of occasional pleuritic chest pain, urinary frequency, and some constipation. Valsartan was increased to 320 mg.  12/11/2022: He was generally feeling well. However, he stated that his blood pressures have not improved at all with his increased dose of Valsartan. Typically, every morning his blood pressure had still been around 150/80, with heart rates in the 50s. Very rarely he had normal readings, such as 128/65 this morning. Later in the day his heart rate would increase to the 60s-70s.  Recently he went to give blood, and his blood pressure was 120/82. Near the end, he developed a vagal response to needle manipulation in his blood vessel. He suddenly felt very dizzy and lightheaded but didn't lose consciousness. After sitting still for 2-3 minutes his symptoms subsided. No further episodes.  Today, he is doing well overall. He has no new complaints.   He has been monitoring his blood pressures at home and it tends to range  from 147-150/70-80 in the mornings. He notices that after he takes his bisoprolol it lowers his blood pressure, but not by much. His blood pressure throughout the day tends to  decrease but does not get lower than 135/70s.  He reports a reading of 118/61 which was drug induced when he was sedated for a procedure. He notes that his bisoprolol has been making him urinate more frequently.   He reports that about 3 months ago he got a cough from a relative after the relative travelled to [place]. He reports the cough has not completely resolved. He does not think it was a cold since he did not have any other symptoms.  She denies any palpitations, chest pain, shortness of breath, or peripheral edema. No lightheadedness, headaches, syncope, orthopnea, or PND.  Past Medical History:  Diagnosis Date   Hyperthyroidism     Past Surgical History:  Procedure Laterality Date   BACK SURGERY  1990   FINGER SURGERY      Current Medications: Current Outpatient Medications on File Prior to Visit  Medication Sig   acetaminophen (TYLENOL) 325 MG tablet Take 1-2 tablets (325-650 mg total) by mouth every 4 (four) hours as needed for mild pain.   amLODipine (NORVASC) 10 MG tablet Take 1 tablet (10 mg total) by mouth daily.   aspirin 81 MG EC tablet Take 1 tablet (81 mg total) by mouth daily. Swallow whole.   diclofenac Sodium (VOLTAREN) 1 % GEL Apply 2 g topically 4 (four) times daily.   levothyroxine (SYNTHROID) 125 MCG tablet Take 125 mcg by mouth every morning.   methocarbamol (ROBAXIN) 500 MG tablet Take 1 tablet (500 mg total) by mouth every 6 (six) hours as needed for muscle spasms.   rosuvastatin (CRESTOR) 20 MG tablet Take 1 tablet (20 mg total) by mouth daily.   valsartan (DIOVAN) 320 MG tablet Take 1 tablet (320 mg total) by mouth daily.   vitamin B-12 (CYANOCOBALAMIN) 1000 MCG tablet Take 1,000 mcg by mouth daily.   No current facility-administered medications on file prior to visit.     Allergies:   Benadryl [diphenhydramine]   Social History   Tobacco Use   Smoking status: Never   Smokeless tobacco: Never  Vaping Use   Vaping Use: Never used  Substance  Use Topics   Alcohol use: Not Currently   Drug use: Not Currently    Family History: family history is negative for Thyroid disease.  ROS:   Please see the history of present illness. (+) cough All other systems are reviewed and negative.    EKGs/Labs/Other Studies Reviewed:    The following studies were reviewed today:  Bilateral Carotid Doppler  06/06/2022: Summary:  Right Carotid: Velocities in the right ICA are consistent with a 1-39%  stenosis.   Left Carotid: Velocities in the left ICA are consistent with a 1-39%  stenosis.   Vertebrals:  Bilateral vertebral arteries demonstrate antegrade flow.  Subclavians: Normal flow hemodynamics were seen in bilateral subclavian arteries.   Venous Doppler 06/06/2022: Summary:  Right Carotid: Velocities in the right ICA are consistent with a 1-39%  stenosis.   Left Carotid: Velocities in the left ICA are consistent with a 1-39%  stenosis.   Vertebrals: Bilateral vertebral arteries demonstrate antegrade flow.  Subclavians: Normal flow hemodynamics were seen in bilateral subclavian               arteries.   Monitor 05/2022: 30 days of data recorded on [employer] monitor. Patient had a min HR  of 47 bpm, max HR of 181 bpm, and avg HR of 72 bpm. Predominant underlying rhythm was Sinus Rhythm. No VT, SVT, atrial fibrillation, high degree block, or pauses noted. Isolated atrial and ventricular ectopy was rare (<1%). There were 6 triggered events, which were sinus rhythm/sinus tach. No significant arrhythmias detected.  CTA Head/Neck  04/12/2022: FINDINGS: CTA NECK FINDINGS   Aortic arch: Unremarkable   Right carotid system: Small mild plaque at the bifurcation without stenosis or ulceration   Left carotid system: Predominately calcified plaque at the ICA bulb. No stenosis or ulceration.   Vertebral arteries: No proximal subclavian stenosis. Dominant right vertebral artery. The vertebral arteries are smoothly contoured  and diffusely patent   Skeleton: None   Other neck: Negative   Upper chest: Negative   Review of the MIP images confirms the above findings   CTA HEAD FINDINGS   Anterior circulation: Atheromatous calcification along the carotid siphons. No branch occlusion, beading, or aneurysm. High-grade right A1 origin Stenosis.   Posterior circulation: Dominant right vertebral artery. Fetal type bilateral PCA. At least moderate narrowing at the left P2 segment. Moderate mid basilar stenosis. No emergent branch occlusion, beading, or aneurysm.   Venous sinuses: Patent   Anatomic variants: As above   Review of the MIP images confirms the above findings   IMPRESSION: 1. No emergent finding. 2. Intracranial atherosclerosis with most notable stenoses affecting the mid basilar, left proximal PCA, and right A1 origin. 3. No flow limiting stenosis or embolic source seen in the neck.  Echocardiogram  04/07/2022: Sonographer Comments: Technically difficult study due to poor echo  windows, suboptimal apical window and suboptimal subcostal window. Patient  had trouble following directions. Study delayed 20 mins to bathroom  patient.   IMPRESSIONS   1. Left ventricular ejection fraction, by estimation, is 60 to 65%. The  left ventricle has normal function. The left ventricle has no regional  wall motion abnormalities. Left ventricular diastolic parameters were  normal.   2. Right ventricular systolic function is normal. The right ventricular  size is normal. Tricuspid regurgitation signal is inadequate for assessing  PA pressure.   3. The mitral valve is grossly normal. Trivial mitral valve  regurgitation.   4. The aortic valve is tricuspid. Aortic valve regurgitation is not  visualized.   5. The inferior vena cava is normal in size with greater than 50%  respiratory variability, suggesting right atrial pressure of 3 mmHg.   Comparison(s): No prior Echocardiogram.   EKG:  EKG is  personally reviewed.   12/11/2022:  EKG was not ordered. 10/30/2022: EKG was not ordered. 07/18/2022:  EKG was not ordered. 06/06/2022:  NSR at 83 bpm  Recent Labs: 04/07/2022: TSH 1.411 04/08/2022: Magnesium 1.9 04/17/2022: ALT 23 04/23/2022: Hemoglobin 15.0; Platelets 225 12/11/2022: BUN 19; Creatinine, Ser 1.27; Potassium 4.4; Sodium 144   Recent Lipid Panel    Component Value Date/Time   CHOL 153 04/07/2022 0900   TRIG 72 04/07/2022 0900   HDL 38 (L) 04/07/2022 0900   CHOLHDL 4.0 04/07/2022 0900   VLDL 14 04/07/2022 0900   LDLCALC 101 (H) 04/07/2022 0900    Physical Exam:    VS:  BP (!) 142/92 (BP Location: Right Arm, Patient Position: Sitting)   Pulse (!) 54   Ht 6' (1.829 m)   Wt 205 lb 6.4 oz (93.2 kg)   SpO2 99%   BMI 27.86 kg/m     Wt Readings from Last 3 Encounters:  01/22/23 205 lb 6.4 oz (93.2  kg)  12/11/22 205 lb 3.2 oz (93.1 kg)  10/30/22 208 lb 12.8 oz (94.7 kg)    GEN: Well nourished, well developed in no acute distress HEENT: Normal, moist mucous membranes NECK: No JVD CARDIAC: regular rhythm, normal S1 and S2, no rubs or gallops. No murmur. VASCULAR: Radial and DP pulses 2+ bilaterally. No carotid bruits RESPIRATORY:  Clear to auscultation without rales, wheezing or rhonchi  ABDOMEN: Soft, non-tender, non-distended MUSCULOSKELETAL:  Ambulates independently SKIN: Warm and dry, no edema NEUROLOGIC:  Alert and oriented x 3. No focal neuro deficits noted. PSYCHIATRIC:  Normal affect    ASSESSMENT:    1. Essential hypertension   2. History of CVA (cerebrovascular accident)   3. Pure hypercholesterolemia   4. Medication management   5. Bilateral carotid artery stenosis    PLAN:    CVA Carotid stenosis Hypercholesterolemia -monitor: no evidence of atrial fibrillation -echo: unremarkable -carotid ultrasounds: 1-39% bilateral carotid stenosis -ECG normal -completed DAPT, now on aspirin alone -on rosuvastatin 20 mg daily, LDL  32  Hypertension -remains elevated today -continue amlodipine 10 mg daily -continue valsartan 320 mg -reported good control with bisoprolol in the past. Trialed low dose of this, but his BP did not improve and his HR dropped into the 40s-50s (asymptomatic) -stop bisoprolol -start chlorthalidone, monitor renal function given prior history of AKI  Cardiac risk counseling and prevention recommendations: -recommend heart healthy/Mediterranean diet, with whole grains, fruits, vegetable, fish, lean meats, nuts, and olive oil. Limit salt. -recommend moderate walking, 3-5 times/week for 30-50 minutes each session. Aim for at least 150 minutes.week. Goal should be pace of 3 miles/hours, or walking 1.5 miles in 30 minutes -recommend avoidance of tobacco products. Avoid excess alcohol.  Plan for follow up: 2-3 weeks for nurse visit and BMET, 3 months with   Buford Dresser, MD, PhD, [facility] at [facility] [address] [place], [place] [address] [phone]   Medication Adjustments/Labs and Tests Ordered: Current medicines are reviewed at length with the patient today.  Concerns regarding medicines are outlined above.   Orders Placed This Encounter  Procedures   Basic metabolic panel   Meds ordered this encounter  Medications   chlorthalidone (HYGROTON) 25 MG tablet    Sig: Take 1 tablet (25 mg total) by mouth daily.    Dispense:  30 tablet    Refill:  11   Patient Instructions  Medication Instructions:  Your physician has recommended you make the following change in your medication:   Stop: Bisoprolol   Start: Chlorthalidone 62m daily   *If you need a refill on your cardiac medications before your next appointment, please call your pharmacy*  Follow-Up: At [facility] you and your health needs are our priority.  As part of our continuing mission to  provide you with exceptional heart care, we have created designated Provider Care Teams.  These Care Teams include your primary Cardiologist (physician) and Advanced Practice Providers (APPs -  Physician Assistants and Nurse Practitioners) who all work together to provide you with the care you need, when you need it.  We recommend signing up for the patient portal called "MyChart".  Sign up information is provided on this After Visit Summary.  MyChart is used to connect with patients for Virtual Visits (Telemedicine).  Patients are able to view lab/test results, encounter notes, upcoming appointments, etc.  Non-urgent messages can be sent to your provider as  well.   To learn more about what you can do with MyChart, go to [email or web address].    Your next appointment:   2-3 weeks Nurse visit for blood pressure check and BMP- please bring your BP logs   &   3 months with Dr. Little Ishikawa Rivera,acting as a scribe for Buford Dresser, MD.,have documented all relevant documentation on the behalf of Buford Dresser, MD,as directed by  Buford Dresser, MD while in the presence of Buford Dresser, MD.  I, Buford Dresser, MD, have reviewed all documentation for this visit. The documentation on 01/23/23 for the exam, diagnosis, procedures, and orders are all accurate and complete.    Signed, Buford Dresser, MD PhD 01/23/2023     [place]

## 2023-01-22 NOTE — Patient Instructions (Signed)
Medication Instructions:  Your physician has recommended you make the following change in your medication:   Stop: Bisoprolol   Start: Chlorthalidone 66m daily   *If you need a refill on your cardiac medications before your next appointment, please call your pharmacy*  Follow-Up: [facility] you and your health needs are our priority.  As part of our continuing mission to provide you with exceptional heart care, we have created designated Provider Care Teams.  These Care Teams include your primary Cardiologist (physician) and Advanced Practice Providers (APPs -  Physician Assistants and Nurse Practitioners) who all work together to provide you with the care you need, when you need it.  We recommend signing up for the patient portal called "MyChart".  Sign up information is provided on this After Visit Summary.  MyChart is used to connect with patients for Virtual Visits (Telemedicine).  Patients are able to view lab/test results, encounter notes, upcoming appointments, etc.  Non-urgent messages can be sent to your provider as well.   To learn more about what you can do with MyChart, go to [email or web address]    Your next appointment:   2-3 weeks Nurse visit for blood pressure check and BMP- please bring your BP logs   &   3 months with Dr. CHarrell Gave

## 2023-01-23 ENCOUNTER — Encounter (HOSPITAL_BASED_OUTPATIENT_CLINIC_OR_DEPARTMENT_OTHER): Payer: Self-pay | Admitting: Cardiology

## 2023-02-05 ENCOUNTER — Ambulatory Visit (INDEPENDENT_AMBULATORY_CARE_PROVIDER_SITE_OTHER): Payer: PPO | Admitting: *Deleted

## 2023-02-05 VITALS — BP 134/70 | Ht 72.0 in | Wt 200.7 lb

## 2023-02-05 DIAGNOSIS — I10 Essential (primary) hypertension: Secondary | ICD-10-CM | POA: Diagnosis not present

## 2023-02-05 LAB — BASIC METABOLIC PANEL
BUN/Creatinine Ratio: 17 (ref 10–24)
BUN: 24 mg/dL (ref 8–27)
CO2: 22 mmol/L (ref 20–29)
Calcium: 9.8 mg/dL (ref 8.6–10.2)
Chloride: 99 mmol/L (ref 96–106)
Creatinine, Ser: 1.44 mg/dL — ABNORMAL HIGH (ref 0.76–1.27)
Glucose: 78 mg/dL (ref 70–99)
Potassium: 4.6 mmol/L (ref 3.5–5.2)
Sodium: 138 mmol/L (ref 134–144)
eGFR: 53 mL/min/{1.73_m2} — ABNORMAL LOW (ref >59)

## 2023-02-05 NOTE — Progress Notes (Signed)
   Nurse Visit   Date of Encounter: 02/05/2023 ID: Ivan Lawson, DOB 10/02/1956, MRN [id number]  PCP:  Gaynelle Arabian, MD   [facility] Cardiologist:  Buford Dresser, MD      Visit Details   VS:  BP 134/70 (BP Location: Right Arm)   Ht 6' (1.829 m)   Wt 200 lb 11.2 oz (91 kg)   BMI 27.22 kg/m  , BMI Body mass index is 27.22 kg/m.  Wt Readings from Last 3 Encounters:  02/05/23 200 lb 11.2 oz (91 kg)  01/22/23 205 lb 6.4 oz (93.2 kg)  12/11/22 205 lb 3.2 oz (93.1 kg)     Reason for visit: Blood pressure check  Performed today:  Vitals  Changes (medications, testing, etc.) : Continue current medications until Dr Harrell Gave reviews  Length of Visit: 10 minutes  Spoke with patient regarding blood pressure readings at home. Per patient prior to recent change blood pressure at home in am before medications running 150/80's and then would come down to 130's. Since starting Chlorthalidone 25 mg daily blood pressure in am prior to medications around 125/70 then comes down to 110's. Heart rate is running in 60's. Did have a virus few days after last visit with fever and lots of coughing. Since then his appetite has not been as good. Yesterday he was splitting wood and started to feel poorly, almost like he was going to pass out. Went home and checked blood pressure 104/69, 98/68, and then 97/65 HR 91. Felt a lack of clarity. After resting about 30 minutes blood pressure 128/78 and felt much better. Patient did not drink much prior to cutting wood. He stated he was going to try doing the same thing tomorrow to see if any issues. Will call or send mychart message with update on how that goes. He denies any lightheadedness or dizziness at other times.    Perrin Maltese, LPN  03/07/5680 2:75 PM

## 2023-02-10 ENCOUNTER — Encounter (HOSPITAL_BASED_OUTPATIENT_CLINIC_OR_DEPARTMENT_OTHER): Payer: Self-pay

## 2023-02-10 DIAGNOSIS — I10 Essential (primary) hypertension: Secondary | ICD-10-CM

## 2023-02-10 DIAGNOSIS — Z5181 Encounter for therapeutic drug level monitoring: Secondary | ICD-10-CM

## 2023-02-20 DIAGNOSIS — E890 Postprocedural hypothyroidism: Secondary | ICD-10-CM | POA: Diagnosis not present

## 2023-02-21 DIAGNOSIS — I10 Essential (primary) hypertension: Secondary | ICD-10-CM | POA: Diagnosis not present

## 2023-02-21 DIAGNOSIS — Z5181 Encounter for therapeutic drug level monitoring: Secondary | ICD-10-CM | POA: Diagnosis not present

## 2023-02-22 LAB — BASIC METABOLIC PANEL
BUN/Creatinine Ratio: 18 (ref 10–24)
BUN: 24 mg/dL (ref 8–27)
CO2: 24 mmol/L (ref 20–29)
Calcium: 9.6 mg/dL (ref 8.6–10.2)
Chloride: 103 mmol/L (ref 96–106)
Creatinine, Ser: 1.37 mg/dL — ABNORMAL HIGH (ref 0.76–1.27)
Glucose: 100 mg/dL — ABNORMAL HIGH (ref 70–99)
Potassium: 4.7 mmol/L (ref 3.5–5.2)
Sodium: 142 mmol/L (ref 134–144)
eGFR: 57 mL/min/{1.73_m2} — ABNORMAL LOW (ref >59)

## 2023-05-05 ENCOUNTER — Encounter (HOSPITAL_BASED_OUTPATIENT_CLINIC_OR_DEPARTMENT_OTHER): Payer: Self-pay | Admitting: Cardiology

## 2023-05-05 ENCOUNTER — Ambulatory Visit (INDEPENDENT_AMBULATORY_CARE_PROVIDER_SITE_OTHER): Payer: PPO | Admitting: Cardiology

## 2023-05-05 VITALS — BP 136/78 | HR 100 | Ht 72.0 in | Wt 203.0 lb

## 2023-05-05 DIAGNOSIS — I6523 Occlusion and stenosis of bilateral carotid arteries: Secondary | ICD-10-CM

## 2023-05-05 DIAGNOSIS — E7800 Pure hypercholesterolemia, unspecified: Secondary | ICD-10-CM

## 2023-05-05 DIAGNOSIS — Z7189 Other specified counseling: Secondary | ICD-10-CM | POA: Diagnosis not present

## 2023-05-05 DIAGNOSIS — Z8673 Personal history of transient ischemic attack (TIA), and cerebral infarction without residual deficits: Secondary | ICD-10-CM

## 2023-05-05 DIAGNOSIS — I10 Essential (primary) hypertension: Secondary | ICD-10-CM

## 2023-05-05 NOTE — Patient Instructions (Signed)
Medication Instructions:  Your physician recommends that you continue on your current medications as directed. Please refer to the Current Medication list given to you today.   *If you need a refill on your cardiac medications before your next appointment, please call your pharmacy*  Lab Work: NONE  Testing/Procedures: NONE   Follow-Up: At [facility], you and your health needs are our priority.  As part of our continuing mission to provide you with exceptional heart care, we have created designated Provider Care Teams.  These Care Teams include your primary Cardiologist (physician) and Advanced Practice Providers (APPs -  Physician Assistants and Nurse Practitioners) who all work together to provide you with the care you need, when you need it.  We recommend signing up for the patient portal called "MyChart".  Sign up information is provided on this After Visit Summary.  MyChart is used to connect with patients for Virtual Visits (Telemedicine).  Patients are able to view lab/test results, encounter notes, upcoming appointments, etc.  Non-urgent messages can be sent to your provider as well.   To learn more about what you can do with MyChart, go to [email or web address].    Your next appointment:   6 month(s)  The format for your next appointment:   In Person  Provider:   Bridgette Christopher, MD             

## 2023-05-05 NOTE — Progress Notes (Signed)
Cardiology Office Note:    Date:  05/05/2023   ID:  SHAJUAN DONE, DOB 1956-11-18, MRN [id number]  PCP:  Blair Heys, MD  Cardiologist:  Jodelle Red, MD  Referring MD: Blair Heys, MD   CC: Follow-up  History of Present Illness:    Ivan Lawson is a 67 y.o. male with a hx of CVA, hypertension, Grave's disease, hypothyroidism, hyperthyroidism, CKD stage IIIa, and prediabetes, who is seen for follow-up. He was initially seen 06/06/2022 as a new consult at the request of Blair Heys, MD for evaluation and management with discussion of monitor results following stroke.  Initially Mr. Aughenbaugh was admitted to the hospital 04/06/22 after presenting with new onset numbness to the fingers and toes of his right side. His work-up was suggestive of acute stroke and possible AKI. He was discharged on aspirin, Plavix, and statin combination, with Plavix to be stopped after 21 days. He was given a 30-day event monitor with cardiology follow-up. Due to resting bradycardia and renal insufficiency, his bisoprolol and HCTZ combination was discontinued while admitted. He was started on Norvasc with hydralazine combination.  He was re-admitted 04/12/2022 after presenting with concerns for right sided weakness and worsening dysarthria. An MRI confirmed evolution of his previously diagnosed stroke (left paramedian pontine infarct). CTA head and neck was significant for stenosis of mid basilar, left proximal PCA and right A1 origin. He was discharged on Brilinta/Aspirin for 30 days, followed by Plavix/Aspirin for 60 days, followed by Aspirin indefinitely. Also discharged on amlodipine 2.5 mg daily; hydralazine was discontinued.  Cardiovascular risk factors: Prior clinical ASCVD: 2 strokes in short succession as described above Comorbid conditions: Hypertension - Initially he had noticed his blood pressure was gradually increasing, as noted when he routinely donated blood. Prior to his stroke, his  blood pressure was averaging 135/60 at home. Since his stroke, he has noticed his blood pressure is now typically in the 150's-160's at home, confirmed in clinic today (150/80).    Tobacco use history: Never.  Family history: Both of his parents had minor strokes; his father died of a heart attack around 35 yo. He has one brother who also has issues with nephrolithiasis, no known cardiovascular issues.  Today, he states he has been feeling wonderful overall. His blood pressure now is much lower on average, usually 130s/70s when he gets up in the mornings. He noticed that the last antihypertensive change seemed to help in the first couple of days. Then after 10 days his blood pressure started creeping back up but didn't reach the original highs. After a time his blood pressure again normalized and has been consistent since then. His diastolic pressure is typically 68-72.  Typically his blood pressure is 130/70 unless something is going on. He states that "everything affects it", worst is caffeine and stress. He also has seen lower blood pressures after long, continuous exertion. If he walks to the mailbox, his blood pressure may increase by 5 points. If he walks for 2 miles, his blood pressure lowers such as 110/62. He walks a quarter mile uphill near the end of his walking route.   He experiences some lightheadedness with lower blood pressures. This has occurred while hoeing the garden and splitting wood. When he feels poorly he will sit down and rest. Has been doing much better with staying adequately hydrated.  He denies any palpitations, chest pain, shortness of breath, peripheral edema, headaches, syncope, orthopnea, or PND.   Past Medical History:  Diagnosis Date   Hyperthyroidism  Past Surgical History:  Procedure Laterality Date   BACK SURGERY  1990   FINGER SURGERY      Current Medications: Current Outpatient Medications on File Prior to Visit  Medication Sig   acetaminophen  (TYLENOL) 325 MG tablet Take 1-2 tablets (325-650 mg total) by mouth every 4 (four) hours as needed for mild pain.   amLODipine (NORVASC) 10 MG tablet Take 1 tablet (10 mg total) by mouth daily.   aspirin 81 MG EC tablet Take 1 tablet (81 mg total) by mouth daily. Swallow whole.   chlorthalidone (HYGROTON) 25 MG tablet Take 1 tablet (25 mg total) by mouth daily.   diclofenac Sodium (VOLTAREN) 1 % GEL Apply 2 g topically 4 (four) times daily.   levothyroxine (SYNTHROID) 125 MCG tablet Take 125 mcg by mouth every morning.   methocarbamol (ROBAXIN) 500 MG tablet Take 1 tablet (500 mg total) by mouth every 6 (six) hours as needed for muscle spasms.   rosuvastatin (CRESTOR) 20 MG tablet Take 1 tablet (20 mg total) by mouth daily.   valsartan (DIOVAN) 320 MG tablet Take 1 tablet (320 mg total) by mouth daily.   vitamin B-12 (CYANOCOBALAMIN) 1000 MCG tablet Take 1,000 mcg by mouth daily.   No current facility-administered medications on file prior to visit.     Allergies:   Benadryl [diphenhydramine]   Social History   Tobacco Use   Smoking status: Never   Smokeless tobacco: Never  Vaping Use   Vaping Use: Never used  Substance Use Topics   Alcohol use: Not Currently   Drug use: Not Currently    Family History: family history is negative for Thyroid disease.  ROS:   Please see the history of present illness. (+) Lightheadedness All other systems are reviewed and negative.    EKGs/Labs/Other Studies Reviewed:    The following studies were reviewed today:  Bilateral Carotid Doppler  06/06/2022: Summary:  Right Carotid: Velocities in the right ICA are consistent with a 1-39%  stenosis.   Left Carotid: Velocities in the left ICA are consistent with a 1-39%  stenosis.   Vertebrals:  Bilateral vertebral arteries demonstrate antegrade flow.  Subclavians: Normal flow hemodynamics were seen in bilateral subclavian arteries.   Venous Doppler 06/06/2022: Summary:  Right Carotid:  Velocities in the right ICA are consistent with a 1-39%  stenosis.   Left Carotid: Velocities in the left ICA are consistent with a 1-39%  stenosis.   Vertebrals: Bilateral vertebral arteries demonstrate antegrade flow.  Subclavians: Normal flow hemodynamics were seen in bilateral subclavian               arteries.   Monitor 05/2022: 30 days of data recorded on [employer] monitor. Patient had a min HR of 47 bpm, max HR of 181 bpm, and avg HR of 72 bpm. Predominant underlying rhythm was Sinus Rhythm. No VT, SVT, atrial fibrillation, high degree block, or pauses noted. Isolated atrial and ventricular ectopy was rare (<1%). There were 6 triggered events, which were sinus rhythm/sinus tach. No significant arrhythmias detected.  CTA Head/Neck  04/12/2022: FINDINGS: CTA NECK FINDINGS   Aortic arch: Unremarkable   Right carotid system: Small mild plaque at the bifurcation without stenosis or ulceration   Left carotid system: Predominately calcified plaque at the ICA bulb. No stenosis or ulceration.   Vertebral arteries: No proximal subclavian stenosis. Dominant right vertebral artery. The vertebral arteries are smoothly contoured and diffusely patent   Skeleton: None   Other neck: Negative   Upper chest:  Negative   Review of the MIP images confirms the above findings   CTA HEAD FINDINGS   Anterior circulation: Atheromatous calcification along the carotid siphons. No branch occlusion, beading, or aneurysm. High-grade right A1 origin Stenosis.   Posterior circulation: Dominant right vertebral artery. Fetal type bilateral PCA. At least moderate narrowing at the left P2 segment. Moderate mid basilar stenosis. No emergent branch occlusion, beading, or aneurysm.   Venous sinuses: Patent   Anatomic variants: As above   Review of the MIP images confirms the above findings   IMPRESSION: 1. No emergent finding. 2. Intracranial atherosclerosis with most notable stenoses  affecting the mid basilar, left proximal PCA, and right A1 origin. 3. No flow limiting stenosis or embolic source seen in the neck.  Echocardiogram  04/07/2022: Sonographer Comments: Technically difficult study due to poor echo  windows, suboptimal apical window and suboptimal subcostal window. Patient  had trouble following directions. Study delayed 20 mins to bathroom  patient.   IMPRESSIONS   1. Left ventricular ejection fraction, by estimation, is 60 to 65%. The  left ventricle has normal function. The left ventricle has no regional  wall motion abnormalities. Left ventricular diastolic parameters were  normal.   2. Right ventricular systolic function is normal. The right ventricular  size is normal. Tricuspid regurgitation signal is inadequate for assessing  PA pressure.   3. The mitral valve is grossly normal. Trivial mitral valve  regurgitation.   4. The aortic valve is tricuspid. Aortic valve regurgitation is not  visualized.   5. The inferior vena cava is normal in size with greater than 50%  respiratory variability, suggesting right atrial pressure of 3 mmHg.   Comparison(s): No prior Echocardiogram.   EKG:  EKG is personally reviewed.   05/05/2023:  Not ordered. 12/11/2022:  EKG was not ordered. 10/30/2022: EKG was not ordered. 07/18/2022:  EKG was not ordered. 06/06/2022:  NSR at 83 bpm  Recent Labs: 02/21/2023: BUN 24; Creatinine, Ser 1.37; Potassium 4.7; Sodium 142   Recent Lipid Panel    Component Value Date/Time   CHOL 153 04/07/2022 0900   TRIG 72 04/07/2022 0900   HDL 38 (L) 04/07/2022 0900   CHOLHDL 4.0 04/07/2022 0900   VLDL 14 04/07/2022 0900   LDLCALC 101 (H) 04/07/2022 0900    Physical Exam:    VS:  BP 136/78 (BP Location: Left Arm, Patient Position: Sitting, Cuff Size: Normal)   Pulse 100   Ht 6' (1.829 m)   Wt 203 lb (92.1 kg)   BMI 27.53 kg/m     Wt Readings from Last 3 Encounters:  05/05/23 203 lb (92.1 kg)  02/05/23 200 lb 11.2 oz (91 kg)   01/22/23 205 lb 6.4 oz (93.2 kg)    GEN: Well nourished, well developed in no acute distress HEENT: Normal, moist mucous membranes NECK: No JVD CARDIAC: regular rhythm, normal S1 and S2, no rubs or gallops. No murmur. VASCULAR: Radial and DP pulses 2+ bilaterally. No carotid bruits RESPIRATORY:  Clear to auscultation without rales, wheezing or rhonchi  ABDOMEN: Soft, non-tender, non-distended MUSCULOSKELETAL:  Ambulates independently SKIN: Warm and dry, no edema NEUROLOGIC:  Alert and oriented x 3. No focal neuro deficits noted. PSYCHIATRIC:  Normal affect    ASSESSMENT:    1. Essential hypertension   2. History of CVA (cerebrovascular accident)   3. Pure hypercholesterolemia   4. Bilateral carotid artery stenosis   5. Cardiac risk counseling     PLAN:    CVA Carotid stenosis Hypercholesterolemia -  monitor: no evidence of atrial fibrillation -echo: unremarkable -carotid ultrasounds: 1-39% bilateral carotid stenosis -ECG normal -completed DAPT, now on aspirin alone -on rosuvastatin 20 mg daily, LDL 32  Hypertension -control improved, reports goal readings at home -continue amlodipine 10 mg daily -continue valsartan 320 mg -reported good control with bisoprolol in the past. Trialed low dose of this, but his BP did not improve and his HR dropped into the 40s-50s (asymptomatic). Would avoid retrial. -continue chlorthalidone, BMET stable  Cardiac risk counseling and prevention recommendations: -recommend heart healthy/Mediterranean diet, with whole grains, fruits, vegetable, fish, lean meats, nuts, and olive oil. Limit salt. -recommend moderate walking, 3-5 times/week for 30-50 minutes each session. Aim for at least 150 minutes.week. Goal should be pace of 3 miles/hours, or walking 1.5 miles in 30 minutes -recommend avoidance of tobacco products. Avoid excess alcohol.  Plan for follow up: 6 months or sooner as needed.   Jodelle Red, MD, PhD, [facility]   [facility]  [place]  [facility] [address], [address] [place], [place] [address] [phone]   Medication Adjustments/Labs and Tests Ordered: Current medicines are reviewed at length with the patient today.  Concerns regarding medicines are outlined above.   No orders of the defined types were placed in this encounter.  No orders of the defined types were placed in this encounter.  Patient Instructions  Medication Instructions:  Your physician recommends that you continue on your current medications as directed. Please refer to the Current Medication list given to you today.  *If you need a refill on your cardiac medications before your next appointment, please call your pharmacy*  Lab Work: NONE  Testing/Procedures: NONE  Follow-Up: At [facility], you and your health needs are our priority.  As part of our continuing mission to provide you with exceptional heart care, we have created designated Provider Care Teams.  These Care Teams include your primary Cardiologist (physician) and Advanced Practice Providers (APPs -  Physician Assistants and Nurse Practitioners) who all work together to provide you with the care you need, when you need it.  We recommend signing up for the patient portal called "MyChart".  Sign up information is provided on this After Visit Summary.  MyChart is used to connect with patients for Virtual Visits (Telemedicine).  Patients are able to view lab/test results, encounter notes, upcoming appointments, etc.  Non-urgent messages can be sent to your provider as well.   To learn more about what you can do with MyChart, go to [email or web address].    Your next appointment:   6 month(s)  The format for your next appointment:   In Person  Provider:   Jodelle Red, MD      [facility] Stumpf,acting as a scribe for Jodelle Red, MD.,have documented all relevant  documentation on the behalf of Jodelle Red, MD,as directed by  Jodelle Red, MD while in the presence of Jodelle Red, MD.  I, Jodelle Red, MD, have reviewed all documentation for this visit. The documentation on 05/05/23 for the exam, diagnosis, procedures, and orders are all accurate and complete.    Signed, Jodelle Red, MD PhD 05/05/2023     [facility]

## 2023-05-15 ENCOUNTER — Encounter: Payer: Self-pay | Admitting: Neurology

## 2023-05-15 ENCOUNTER — Ambulatory Visit: Payer: PPO | Admitting: Neurology

## 2023-05-15 VITALS — BP 157/82 | HR 87 | Ht 72.0 in | Wt 205.5 lb

## 2023-05-15 DIAGNOSIS — I639 Cerebral infarction, unspecified: Secondary | ICD-10-CM

## 2023-05-15 NOTE — Progress Notes (Signed)
[facility]  PATIENT: Ivan Lawson DOB: 06-10-1956  REQUESTING CLINICIAN: Blair Heys, MD HISTORY FROM: Patient  REASON FOR VISIT: Left pontine stroke    HISTORICAL  CHIEF COMPLAINT:  Chief Complaint  Patient presents with   Follow-up    Rm12, alone cerebrovascular accident:doing wel patient reported no problems/concerns    INTERVAL HISTORY 05/15/2023: Patient presents today for follow-up, he is alone.  He reports since last visit he has been doing well.  He completed physical therapy and continues to do the exercises at home.  He reports that his strength is back to almost normal.  He still has a little tingling sensation in the thumb and index finger on his right hand and low clearance with the right leg but otherwise he is doing well, denies any falls.  Denies any new weakness   HISTORY OF PRESENT ILLNESS:  This is a 67 year old gentleman past medical history of hypertension, hyperlipidemia, hypothyroidism and B12 deficiency who is presenting after being discharged from the hospital for left paramedian stroke.  Patient initially presented to the hospital on May 6 with complaint of numbness of the right hand as well as to right foot, his initial work-up showed left paramedian stroke.  He was stable and discharged home on aspirin and Plavix.  He presented again 4 days later with worsening of his symptoms and MRI at that time showed increased area of the stroke.  He was switched from Aspirin/Plavix to aspirin and Brilinta for 1 month followed by Aspirin/Plavix for 2 months and then Aspirin alone.  He was also sent to rehab. Patient reported he completed rehab and that right now he is doing much better. There is improvement of his weakness and numbness of the right side.  He reported his recovery was slowed down by acute gout flares but currently he is doing well.  Denies any falls.  He reported he started to feel little unsteady but his gait has much improved.   He continue physical therapy twice a week.  Currently he completed aspirin Brilinta, and right now he is on aspirin and Plavix.  He understands to continue with Aspirin/Plavix for 39-months then followed by Aspirin alone.    Hospital summary  MRI confirms worsening of previously diagnosed left paramedian pontine infarct. CTA head and neck significant for stenosis of mid basilar, left proximal PCA and right A1 origin. LDL of 101. Hemoglobin A1C of 6.0%. Repeat Transthoracic Echocardiogram not obtained. Neurology recommendations for Aspirin/Brilinta for one (1) month, followed by Aspirin/Plavix for two (2) months followed by aspirin monotherapy. PT/OT recommendations for inpatient rehabilitation.  OTHER MEDICAL CONDITIONS: Hypertension, Hyperlipidemia, Hypothyroidism, B12 deficiency    REVIEW OF SYSTEMS: Full 14 system review of systems performed and negative with exception of: as noted in the HPI   ALLERGIES: Allergies  Allergen Reactions   Diphenhydramine Anxiety    Other Reaction(s): Not available    HOME MEDICATIONS: Outpatient Medications Prior to Visit  Medication Sig Dispense Refill   acetaminophen (TYLENOL) 325 MG tablet Take 1-2 tablets (325-650 mg total) by mouth every 4 (four) hours as needed for mild pain.     amLODipine (NORVASC) 10 MG tablet Take 1 tablet (10 mg total) by mouth daily. 90 tablet 3   aspirin 81 MG EC tablet Take 1 tablet (81 mg total) by mouth daily. Swallow whole. 30 tablet 11   chlorthalidone (HYGROTON) 25 MG tablet Take 1 tablet (25 mg total) by mouth daily. 30 tablet 11   levothyroxine (SYNTHROID) 125 MCG  tablet Take 125 mcg by mouth every morning.     methocarbamol (ROBAXIN) 500 MG tablet Take 1 tablet (500 mg total) by mouth every 6 (six) hours as needed for muscle spasms. 60 tablet 0   rosuvastatin (CRESTOR) 20 MG tablet Take 1 tablet (20 mg total) by mouth daily. 90 tablet 3   valsartan (DIOVAN) 320 MG tablet Take 1 tablet (320 mg total) by mouth daily.  90 tablet 3   vitamin B-12 (CYANOCOBALAMIN) 1000 MCG tablet Take 1,000 mcg by mouth daily.     diclofenac Sodium (VOLTAREN) 1 % GEL Apply 2 g topically 4 (four) times daily.     No facility-administered medications prior to visit.    PAST MEDICAL HISTORY: Past Medical History:  Diagnosis Date   Hyperthyroidism     PAST SURGICAL HISTORY: Past Surgical History:  Procedure Laterality Date   BACK SURGERY  1990   FINGER SURGERY      FAMILY HISTORY: Family History  Problem Relation Age of Onset   Thyroid disease Neg Hx     SOCIAL HISTORY: Social History   Socioeconomic History   Marital status: Single    Spouse name: Not on file   Number of children: 0   Years of education: Not on file   Highest education level: Not on file  Occupational History   Not on file  Tobacco Use   Smoking status: Never   Smokeless tobacco: Never  Vaping Use   Vaping Use: Never used  Substance and Sexual Activity   Alcohol use: Not Currently   Drug use: Not Currently   Sexual activity: Yes    Birth control/protection: Condom  Other Topics Concern   Not on file  Social History Narrative   Not on file   Social Determinants of Health   Financial Resource Strain: Low Risk  (06/06/2022)   Overall Financial Resource Strain (CARDIA)    Difficulty of Paying Living Expenses: Not hard at all  Food Insecurity: No Food Insecurity (06/06/2022)   Hunger Vital Sign    Worried About Running Out of Food in the Last Year: Never true    Ran Out of Food in the Last Year: Never true  Transportation Needs: No Transportation Needs (06/06/2022)   PRAPARE - Administrator, Civil Service (Medical): No    Lack of Transportation (Non-Medical): No  Physical Activity: Inactive (06/06/2022)   Exercise Vital Sign    Days of Exercise per Week: 0 days    Minutes of Exercise per Session: 0 min  Stress: Not on file  Social Connections: Not on file  Intimate Partner Violence: Not on file    PHYSICAL  EXAM  GENERAL EXAM/CONSTITUTIONAL: Vitals:  Vitals:   05/15/23 1008  BP: (!) 157/82  Pulse: 87  Weight: 205 lb 8 oz (93.2 kg)  Height: 6' (1.829 m)   Body mass index is 27.87 kg/m. Wt Readings from Last 3 Encounters:  05/15/23 205 lb 8 oz (93.2 kg)  05/05/23 203 lb (92.1 kg)  02/05/23 200 lb 11.2 oz (91 kg)   Patient is in no distress; well developed, nourished and groomed; neck is supple  MUSCULOSKELETAL: Gait, strength, tone, movements noted in Neurologic exam below  NEUROLOGIC: MENTAL STATUS:      No data to display         awake, alert, oriented to person, place and time recent and remote memory intact normal attention and concentration language fluent, comprehension intact, naming intact fund of knowledge appropriate  CRANIAL NERVE:  2nd, 3rd, 4th, 6th - visual fields full to confrontation, extraocular muscles intact, no nystagmus 5th - facial sensation symmetric 7th - facial strength symmetric 8th - hearing intact 9th - palate elevates symmetrically, uvula midline 11th - shoulder shrug symmetric 12th - tongue protrusion midline  MOTOR:  normal bulk and tone, full strength in the BUE, BLE  SENSORY:  normal and symmetric to light touch,  COORDINATION:  finger-nose-finger, fine finger movements normal  REFLEXES:  deep tendon reflexes present and symmetric  GAIT/STATION:  Minimal high steppage gait on the right. Unable to tandem walk.    DIAGNOSTIC DATA (LABS, IMAGING, TESTING) - I reviewed patient records, labs, notes, testing and imaging myself where available.  Lab Results  Component Value Date   WBC 6.6 04/23/2022   HGB 15.0 04/23/2022   HCT 44.3 04/23/2022   MCV 87.5 04/23/2022   PLT 225 04/23/2022      Component Value Date/Time   NA 142 02/21/2023 1430   K 4.7 02/21/2023 1430   CL 103 02/21/2023 1430   CO2 24 02/21/2023 1430   GLUCOSE 100 (H) 02/21/2023 1430   GLUCOSE 93 04/24/2022 0541   BUN 24 02/21/2023 1430   CREATININE  1.37 (H) 02/21/2023 1430   CALCIUM 9.6 02/21/2023 1430   PROT 6.5 04/17/2022 0624   ALBUMIN 3.6 04/17/2022 0624   AST 24 04/17/2022 0624   ALT 23 04/17/2022 0624   ALKPHOS 51 04/17/2022 0624   BILITOT 1.2 04/17/2022 0624   GFRNONAA 55 (L) 04/24/2022 0541   Lab Results  Component Value Date   CHOL 153 04/07/2022   HDL 38 (L) 04/07/2022   LDLCALC 101 (H) 04/07/2022   TRIG 72 04/07/2022   CHOLHDL 4.0 04/07/2022   Lab Results  Component Value Date   HGBA1C 6.0 (H) 04/07/2022   Lab Results  Component Value Date   VITAMINB12 180 (L) 03/22/2016   Lab Results  Component Value Date   TSH 1.411 04/07/2022    Code Stroke CT head No acute abnormality. Small vessel disease. Known left pontine infarct. ASPECTS 10.    CTA head & neck Stenoses in mid basilar, left proximal PCA and right A1 origin MRI extension of previous left pontine infarct 2D Echo EF 60-65%, no atrial level shunt LDL 101 HgbA1c 6.0   ASSESSMENT AND PLAN  67 y.o. year old male with stroke risk factors including hypertension, hyperlipidemia who is presenting for follow up after a left pontine stroke.  He has completed DAPT for 3 months and currently on aspirin alone.  He also completed physical therapy.  Currently he is doing well.  He is compliant with his medications.  He feels like his strength is almost back to normal.  Plan for now is for patient to continue with PCP, continue his current medications and to return as needed.  He voiced understanding.   1. Left pontine cerebrovascular accident [facility])      Patient Instructions  Continue current medications Continue to follow with PCP Return as needed.  No orders of the defined types were placed in this encounter.   No orders of the defined types were placed in this encounter.   Return if symptoms worsen or fail to improve.  I have spent a total of 45 minutes dedicated to this patient today, preparing to see patient, performing a medically appropriate  examination and evaluation, ordering tests and/or medications and procedures, and counseling and educating the patient/family/caregiver; independently interpreting result and communicating results to the family/patient/caregiver; and documenting clinical  information in the electronic medical record.    Windell Norfolk, MD 05/15/2023, 11:21 AM  [facility] [address] [place], [place] [address] [phone]

## 2023-05-15 NOTE — Patient Instructions (Signed)
Continue current medications Continue to follow with PCP Return as needed. 

## 2023-05-19 DIAGNOSIS — E890 Postprocedural hypothyroidism: Secondary | ICD-10-CM | POA: Diagnosis not present

## 2023-05-19 DIAGNOSIS — E785 Hyperlipidemia, unspecified: Secondary | ICD-10-CM | POA: Diagnosis not present

## 2023-05-19 DIAGNOSIS — E0500 Thyrotoxicosis with diffuse goiter without thyrotoxic crisis or storm: Secondary | ICD-10-CM | POA: Diagnosis not present

## 2023-05-19 DIAGNOSIS — I10 Essential (primary) hypertension: Secondary | ICD-10-CM | POA: Diagnosis not present

## 2023-05-19 DIAGNOSIS — E538 Deficiency of other specified B group vitamins: Secondary | ICD-10-CM | POA: Diagnosis not present

## 2023-06-16 ENCOUNTER — Other Ambulatory Visit: Payer: Self-pay | Admitting: Cardiology

## 2023-06-16 NOTE — Telephone Encounter (Signed)
Rx(s) sent to pharmacy electronically.  

## 2023-10-17 ENCOUNTER — Other Ambulatory Visit (HOSPITAL_BASED_OUTPATIENT_CLINIC_OR_DEPARTMENT_OTHER): Payer: Self-pay | Admitting: Cardiology

## 2023-10-17 DIAGNOSIS — I10 Essential (primary) hypertension: Secondary | ICD-10-CM

## 2023-10-17 DIAGNOSIS — Z8673 Personal history of transient ischemic attack (TIA), and cerebral infarction without residual deficits: Secondary | ICD-10-CM

## 2023-11-17 ENCOUNTER — Encounter (HOSPITAL_BASED_OUTPATIENT_CLINIC_OR_DEPARTMENT_OTHER): Payer: Self-pay | Admitting: Cardiology

## 2023-11-17 ENCOUNTER — Ambulatory Visit (HOSPITAL_BASED_OUTPATIENT_CLINIC_OR_DEPARTMENT_OTHER): Payer: PPO | Admitting: Cardiology

## 2023-11-17 VITALS — BP 124/70 | HR 67 | Ht 72.0 in | Wt 211.4 lb

## 2023-11-17 DIAGNOSIS — I6523 Occlusion and stenosis of bilateral carotid arteries: Secondary | ICD-10-CM | POA: Diagnosis not present

## 2023-11-17 DIAGNOSIS — Z8673 Personal history of transient ischemic attack (TIA), and cerebral infarction without residual deficits: Secondary | ICD-10-CM

## 2023-11-17 DIAGNOSIS — Z7189 Other specified counseling: Secondary | ICD-10-CM

## 2023-11-17 DIAGNOSIS — E7800 Pure hypercholesterolemia, unspecified: Secondary | ICD-10-CM

## 2023-11-17 DIAGNOSIS — I10 Essential (primary) hypertension: Secondary | ICD-10-CM | POA: Diagnosis not present

## 2023-11-17 MED ORDER — CHLORTHALIDONE 25 MG PO TABS: 25.0000 mg | ORAL_TABLET | Freq: Every day | ORAL | 3 refills | Status: AC

## 2023-11-17 NOTE — Patient Instructions (Signed)

## 2023-11-17 NOTE — Progress Notes (Signed)
Cardiology Office Note:  .   Date:  11/17/2023  ID:  CAMILO LIBENGOOD, DOB 1956/02/03, MRN [id number] PCP: Blair Heys, MD (Inactive)  [facility] Providers Cardiologist:  Jodelle Red, MD {  History of Present Illness: .   Ivan Lawson is a 67 y.o. male with a hx of CVA, hypertension, Grave's disease, hypothyroidism, hyperthyroidism, CKD stage IIIa, and prediabetes, who is seen for follow-up. He was initially seen 06/06/2022 as a new consult at the request of Blair Heys, MD for evaluation and management with discussion of monitor results following stroke.   Initially Mr. Roscher was admitted to the hospital 04/06/22 after presenting with new onset numbness to the fingers and toes of his right side. His work-up was suggestive of acute stroke and possible AKI. He was discharged on aspirin, Plavix, and statin combination, with Plavix to be stopped after 21 days. He was given a 30-day event monitor with cardiology follow-up. Due to resting bradycardia and renal insufficiency, his bisoprolol and HCTZ combination was discontinued while admitted. He was started on Norvasc with hydralazine combination.   He was re-admitted 04/12/2022 after presenting with concerns for right sided weakness and worsening dysarthria. An MRI confirmed evolution of his previously diagnosed stroke (left paramedian pontine infarct). CTA head and neck was significant for stenosis of mid basilar, left proximal PCA and right A1 origin. He was discharged on Brilinta/Aspirin for 30 days, followed by Plavix/Aspirin for 60 days, followed by Aspirin indefinitely. Also discharged on amlodipine 2.5 mg daily; hydralazine was discontinued.   Cardiovascular risk factors: Prior clinical ASCVD: 2 strokes in short succession as described above Comorbid conditions: Hypertension - Initially he had noticed his blood pressure was gradually increasing, as noted when he routinely donated blood. Prior to his stroke, his blood  pressure was averaging 135/60 at home. Since his stroke, he has noticed his blood pressure is now typically in the 150's-160's at home, confirmed in clinic today (150/80).    Tobacco use history: Never.  Family history: Both of his parents had minor strokes; his father died of a heart attack around 33 yo. He has one brother who also has issues with nephrolithiasis, no known cardiovascular issues.  Today: Doing well from a cardiac perspective. Not checking BP as often as it has been well controlled generally. Tolerating medications well. Did have a period of time in September when his BP ran a bit higher than usual for about 10 days, and then returned to normal. Hasn't happened since. No clear associated factors. Very rare low numbers, and he has symptoms with these episodes. Playing pickleball more regularly, sometimes feels a little lightheaded after playing but improves with drinking water. Feels that playing pickleball has improved his balance and stamina.  ROS: Denies chest pain, shortness of breath at rest or with normal exertion. No PND, orthopnea, LE edema or unexpected weight gain. No syncope or palpitations. ROS otherwise negative except as noted.   Studies Reviewed: Marland Kitchen    EKG:  EKG Interpretation Date/Time:  Monday November 17 2023 15:35:46 EST Ventricular Rate:  67 PR Interval:  156 QRS Duration:  94 QT Interval:  390 QTC Calculation: 412 R Axis:   -11  Text Interpretation: Normal sinus rhythm Normal ECG Confirmed by Jodelle Red [phone]) on 11/17/2023 4:15:51 PM    Physical Exam:   VS:  BP 124/70   Pulse 67   Ht 6' (1.829 m)   Wt 211 lb 6.4 oz (95.9 kg)   SpO2 97%   BMI 28.67  kg/m    Wt Readings from Last 3 Encounters:  11/17/23 211 lb 6.4 oz (95.9 kg)  05/15/23 205 lb 8 oz (93.2 kg)  05/05/23 203 lb (92.1 kg)    GEN: Well nourished, well developed in no acute distress HEENT: Normal, moist mucous membranes NECK: No JVD CARDIAC: regular rhythm, normal S1 and  S2, no rubs or gallops. No murmur. VASCULAR: Radial and DP pulses 2+ bilaterally. No carotid bruits RESPIRATORY:  Clear to auscultation without rales, wheezing or rhonchi  ABDOMEN: Soft, non-tender, non-distended MUSCULOSKELETAL:  Ambulates independently SKIN: Warm and dry, no edema NEUROLOGIC:  Alert and oriented x 3. No focal neuro deficits noted. PSYCHIATRIC:  Normal affect    ASSESSMENT AND PLAN: .    CVA Carotid stenosis Hypercholesterolemia -monitor: no evidence of atrial fibrillation -echo: unremarkable -carotid ultrasounds: 1-39% bilateral carotid stenosis -ECG normal -completed DAPT, now on aspirin alone -on rosuvastatin 20 mg daily, LDL 32   Hypertension -continue amlodipine 10 mg daily -continue valsartan 320 mg -reported good control with bisoprolol in the past. Trialed low dose of this, but his BP did not improve and his HR dropped into the 40s-50s (asymptomatic). Would avoid retrial. -continue chlorthalidone  CV risk counseling and prevention -recommend heart healthy/Mediterranean diet, with whole grains, fruits, vegetable, fish, lean meats, nuts, and olive oil. Limit salt. -recommend moderate walking, 3-5 times/week for 30-50 minutes each session. Aim for at least 150 minutes.week. Goal should be pace of 3 miles/hours, or walking 1.5 miles in 30 minutes -recommend avoidance of tobacco products. Avoid excess alcohol.  Dispo: 6 mos or sooner as needed  Signed, Jodelle Red, MD   Jodelle Red, MD, PhD, [facility]  [facility]  [place]  Heart & Vascular at [facility] at [facility] [address] [place], [place] [address] [phone]

## 2023-11-21 DIAGNOSIS — I10 Essential (primary) hypertension: Secondary | ICD-10-CM | POA: Diagnosis not present

## 2023-11-21 DIAGNOSIS — R7303 Prediabetes: Secondary | ICD-10-CM | POA: Diagnosis not present

## 2023-11-21 DIAGNOSIS — I700 Atherosclerosis of aorta: Secondary | ICD-10-CM | POA: Diagnosis not present

## 2023-11-21 DIAGNOSIS — Z0000 Encounter for general adult medical examination without abnormal findings: Secondary | ICD-10-CM | POA: Diagnosis not present

## 2023-11-21 DIAGNOSIS — E039 Hypothyroidism, unspecified: Secondary | ICD-10-CM | POA: Diagnosis not present

## 2023-11-21 DIAGNOSIS — N183 Chronic kidney disease, stage 3 unspecified: Secondary | ICD-10-CM | POA: Diagnosis not present

## 2023-11-21 DIAGNOSIS — E782 Mixed hyperlipidemia: Secondary | ICD-10-CM | POA: Diagnosis not present

## 2023-11-21 DIAGNOSIS — Z9181 History of falling: Secondary | ICD-10-CM | POA: Diagnosis not present

## 2023-12-12 ENCOUNTER — Other Ambulatory Visit (HOSPITAL_BASED_OUTPATIENT_CLINIC_OR_DEPARTMENT_OTHER): Payer: Self-pay | Admitting: Cardiology

## 2023-12-12 DIAGNOSIS — I10 Essential (primary) hypertension: Secondary | ICD-10-CM

## 2023-12-12 DIAGNOSIS — Z8673 Personal history of transient ischemic attack (TIA), and cerebral infarction without residual deficits: Secondary | ICD-10-CM

## 2024-05-05 ENCOUNTER — Ambulatory Visit (HOSPITAL_BASED_OUTPATIENT_CLINIC_OR_DEPARTMENT_OTHER): Payer: PPO | Admitting: Cardiology

## 2024-05-12 ENCOUNTER — Encounter (HOSPITAL_BASED_OUTPATIENT_CLINIC_OR_DEPARTMENT_OTHER): Payer: Self-pay | Admitting: Cardiology

## 2024-05-12 ENCOUNTER — Ambulatory Visit (HOSPITAL_BASED_OUTPATIENT_CLINIC_OR_DEPARTMENT_OTHER): Admitting: Cardiology

## 2024-05-12 VITALS — BP 110/62 | HR 64 | Ht 72.0 in | Wt 204.3 lb

## 2024-05-12 DIAGNOSIS — I9589 Other hypotension: Secondary | ICD-10-CM | POA: Diagnosis not present

## 2024-05-12 DIAGNOSIS — E7800 Pure hypercholesterolemia, unspecified: Secondary | ICD-10-CM

## 2024-05-12 DIAGNOSIS — I10 Essential (primary) hypertension: Secondary | ICD-10-CM

## 2024-05-12 DIAGNOSIS — Z8673 Personal history of transient ischemic attack (TIA), and cerebral infarction without residual deficits: Secondary | ICD-10-CM | POA: Diagnosis not present

## 2024-05-12 DIAGNOSIS — I6523 Occlusion and stenosis of bilateral carotid arteries: Secondary | ICD-10-CM

## 2024-05-12 DIAGNOSIS — Z7189 Other specified counseling: Secondary | ICD-10-CM

## 2024-05-12 NOTE — Progress Notes (Signed)
 Cardiology Office Note:  .   Date:  05/12/2024  ID:  Ivan Lawson, DOB May 06, 1956, MRN [id number] PCP: Ivan Grounds, DO  [facility] Providers Cardiologist:  Sheryle Donning, MD {  History of Present Illness: .   Ivan Lawson is a 68 y.o. male with a hx of CVA, hypertension, Grave's disease, hypothyroidism, hyperthyroidism, CKD stage IIIa, and prediabetes, who is seen for follow-up. He was initially seen 06/06/2022 as a new consult at the request of Ivan Memory, MD for evaluation and management with discussion of monitor results following stroke.   Initially Ivan Lawson was admitted to the hospital 04/06/22 after presenting with new onset numbness to the fingers and toes of his right side. His work-up was suggestive of acute stroke and possible AKI. He was discharged on aspirin , Plavix , and statin combination, with Plavix  to be stopped after 21 days. He was given a 30-day event monitor with cardiology follow-up. Due to resting bradycardia and renal insufficiency, his bisoprolol  and HCTZ combination was discontinued while admitted. He was started on Norvasc  with hydralazine  combination.   He was re-admitted 04/12/2022 after presenting with concerns for right sided weakness and worsening dysarthria. An MRI confirmed evolution of his previously diagnosed stroke (left paramedian pontine infarct). CTA head and neck was significant for stenosis of mid basilar, left proximal PCA and right A1 origin. He was discharged on Brilinta /Aspirin  for 30 days, followed by Plavix /Aspirin  for 60 days, followed by Aspirin  indefinitely. Also discharged on amlodipine  2.5 mg daily; hydralazine  was discontinued.   Cardiovascular risk factors: Prior clinical ASCVD: 2 strokes in short succession as described above Comorbid conditions: Hypertension - Initially he had noticed his blood pressure was gradually increasing, as noted when he routinely donated blood. Prior to his stroke, his blood pressure was  averaging 135/60 at home. Since his stroke, he has noticed his blood pressure is now typically in the 150's-160's at home Tobacco use history: Never.  Family history: Both of his parents had minor strokes; his father died of a heart attack around 13 yo. He has one brother who also has issues with nephrolithiasis, no known cardiovascular issues.  Today: Overall doing well. Reviewed lipids from 11/2023, Tchol 96, HDL 41, LDL 32, TG 139.  Blood pressure at home is usually 120s when he wakes up in the morning. Lowest he had seen was 102/55 after working in the garden, was asymptomatic. Has been below 100 systolic a few times after activity. Feels lightheaded after bending over/standing up. Highest number has been 135 systolic. Discussed hydration, which he could do better with.  Had periodic leg cramps/twitching after his stroke, was taking periodic methocarbamol  for this with some help. After discussing with his neighbor, he was recommended to try CoQ10 as it could be his statin. He tried the CoQ10 with improvement.  ROS: Denies chest pain, shortness of breath at rest or with normal exertion. No PND, orthopnea, LE edema or unexpected weight gain. No syncope or palpitations. ROS otherwise negative except as noted.   Studies Reviewed: Ivan Lawson    EKG:       Physical Exam:   VS:  BP 110/62   Pulse 64   Ht 6' (1.829 m)   Wt 204 lb 4.8 oz (92.7 kg)   SpO2 98%   BMI 27.71 kg/m    Wt Readings from Last 3 Encounters:  05/12/24 204 lb 4.8 oz (92.7 kg)  11/17/23 211 lb 6.4 oz (95.9 kg)  05/15/23 205 lb 8 oz (93.2 kg)  GEN: Well nourished, well developed in no acute distress HEENT: Normal, moist mucous membranes NECK: No JVD CARDIAC: regular rhythm, normal S1 and S2, no rubs or gallops. No murmur. VASCULAR: Radial and DP pulses 2+ bilaterally. No carotid bruits RESPIRATORY:  Clear to auscultation without rales, wheezing or rhonchi  ABDOMEN: Soft, non-tender, non-distended MUSCULOSKELETAL:   Ambulates independently SKIN: Warm and dry, no edema NEUROLOGIC:  Alert and oriented x 3. No focal neuro deficits noted. PSYCHIATRIC:  Normal affect    ASSESSMENT AND PLAN: .    CVA Carotid stenosis Hypercholesterolemia -monitor: no evidence of atrial fibrillation -echo: unremarkable -carotid ultrasounds: 1-39% bilateral carotid stenosis -ECG normal -completed DAPT, now on aspirin  alone -on rosuvastatin  20 mg daily, LDL 32   Hypertension Exercise induced hypotension -BP with intermittent lows, especially after activity. Feels lightheaded with exertion -Instructions given: Try to hydrate more, especially with activity. If this helps you feel less lightheaded/foggy with increasing fluid, then we won't change anything. But, if this doesn't help your symptoms, and you are still seeing periodic top blood pressure numbers around 100, then I want you to decrease your amlodipine  to 5 mg from 10 mg daily. If decreasing the amlodipine  dose helps your symptoms and your blood pressure is typically still less than 130 on the top number, let me know and we will send in the amlodipine  as a 5 mg dose. -continue valsartan  320 mg -reported good control with bisoprolol  in the past. Trialed low dose of this, but his BP did not improve and his HR dropped into the 40s-50s (asymptomatic). Would avoid retrial. -continue chlorthalidone  for now, stop if hypotension worsens  CV risk counseling and prevention -recommend heart healthy/Mediterranean diet, with whole grains, fruits, vegetable, fish, lean meats, nuts, and olive oil. Limit salt. -recommend moderate walking, 3-5 times/week for 30-50 minutes each session. Aim for at least 150 minutes.week. Goal should be pace of 3 miles/hours, or walking 1.5 miles in 30 minutes -recommend avoidance of tobacco products. Avoid excess alcohol.  Dispo: 6 mos or sooner as needed  Signed, Sheryle Donning, MD   Sheryle Donning, MD, PhD, [facility]  [facility]  [facility]  [place]  Heart & Vascular at [facility] at [facility] [address] [place], [place] [address] [phone]

## 2024-05-12 NOTE — Patient Instructions (Addendum)
 Medication Instructions:  Your physician recommends that you continue on your current medications as directed. Please refer to the Current Medication list given to you today.   Try to hydrate more, especially with activity. If this helps you feel less lightheaded/foggy with increasing fluid, then we won't change anything. But, if this doesn't help your symptoms, and you are still seeing periodic top blood pressure numbers around 100, then I want you to decrease your amlodipine  to 5 mg from 10 mg daily. If decreasing the amlodipine  dose helps your symptoms and your blood pressure is typically still less than 130 on the top number, let me know and we will send in the amlodipine  as a 5 mg dose.  Follow-Up: Please follow up in 6 months with Dr. Veryl Gottron, Slater [place], NP or Neomi Banks, NP

## 2024-06-11 ENCOUNTER — Other Ambulatory Visit (HOSPITAL_BASED_OUTPATIENT_CLINIC_OR_DEPARTMENT_OTHER): Payer: Self-pay | Admitting: Cardiology

## 2024-06-11 DIAGNOSIS — Z8673 Personal history of transient ischemic attack (TIA), and cerebral infarction without residual deficits: Secondary | ICD-10-CM

## 2024-06-11 DIAGNOSIS — I10 Essential (primary) hypertension: Secondary | ICD-10-CM

## 2024-11-10 ENCOUNTER — Encounter (HOSPITAL_BASED_OUTPATIENT_CLINIC_OR_DEPARTMENT_OTHER): Payer: Self-pay | Admitting: Cardiology

## 2024-11-10 ENCOUNTER — Ambulatory Visit (HOSPITAL_BASED_OUTPATIENT_CLINIC_OR_DEPARTMENT_OTHER): Admitting: Cardiology

## 2024-11-10 VITALS — BP 124/68 | HR 84 | Ht 72.0 in | Wt 210.5 lb

## 2024-11-10 DIAGNOSIS — I9589 Other hypotension: Secondary | ICD-10-CM

## 2024-11-10 DIAGNOSIS — I10 Essential (primary) hypertension: Secondary | ICD-10-CM

## 2024-11-10 DIAGNOSIS — I6523 Occlusion and stenosis of bilateral carotid arteries: Secondary | ICD-10-CM | POA: Diagnosis not present

## 2024-11-10 DIAGNOSIS — Z8673 Personal history of transient ischemic attack (TIA), and cerebral infarction without residual deficits: Secondary | ICD-10-CM

## 2024-11-10 DIAGNOSIS — E7800 Pure hypercholesterolemia, unspecified: Secondary | ICD-10-CM | POA: Diagnosis not present

## 2024-11-10 DIAGNOSIS — Z7189 Other specified counseling: Secondary | ICD-10-CM

## 2024-11-10 NOTE — Patient Instructions (Signed)

## 2024-11-10 NOTE — Progress Notes (Signed)
 Cardiology Office Note:  .   Date:  11/10/2024  ID:  Ivan Lawson, DOB [birth year], MRN [id number] PCP: Auston Opal, DO  [facility] Providers Cardiologist:  Shelda Bruckner, MD {  History of Present Illness: .   Ivan Lawson is a 68 y.o. male with a hx of CVA, hypertension, Grave's disease, hypothyroidism, hyperthyroidism, CKD stage IIIa, and prediabetes, who is seen for follow-up. He was initially seen 06/06/2022 as a new consult at the request of Ivan Charleston, MD for evaluation and management with discussion of monitor results following stroke.   Initially Ivan Lawson was admitted to the hospital 04/06/22 after presenting with new onset numbness to the fingers and toes of his right side. His work-up was suggestive of acute stroke and possible AKI. He was discharged on aspirin , Plavix , and statin combination, with Plavix  to be stopped after 21 days. He was given a 30-day event monitor with cardiology follow-up. Due to resting bradycardia and renal insufficiency, his bisoprolol  and HCTZ combination was discontinued while admitted. He was started on Norvasc  with hydralazine  combination.   He was re-admitted 04/12/2022 after presenting with concerns for right sided weakness and worsening dysarthria. An MRI confirmed evolution of his previously diagnosed stroke (left paramedian pontine infarct). CTA head and neck was significant for stenosis of mid basilar, left proximal PCA and right A1 origin. He was discharged on Brilinta /Aspirin  for 30 days, followed by Plavix /Aspirin  for 60 days, followed by Aspirin  indefinitely. Also discharged on amlodipine  2.5 mg daily; hydralazine  was discontinued.   Cardiovascular risk factors: Prior clinical ASCVD: 2 strokes in short succession as described above Comorbid conditions: Hypertension - Initially he had noticed his blood pressure was gradually increasing, as noted when he routinely donated blood. Prior to his stroke, his blood pressure was  averaging 135/60 at home. Since his stroke, he has noticed his blood pressure is now typically in the 150's-160's at home Tobacco use history: Never.  Family history: Both of his parents had minor strokes; his father died of a heart attack around 50 yo. He has one brother who also has issues with nephrolithiasis, no known cardiovascular issues.  Today: Overall doing well. Reviewed medications. Has been working to drink more fluids, especially when he is playing pickleball. Still has symptoms of low blood pressure after exertion (fatigue/loss of focus), lowest he has seen has been 100/55 (better than prior).   ROS: Denies chest pain, shortness of breath at rest or with normal exertion. No PND, orthopnea, LE edema or unexpected weight gain. No syncope or palpitations. ROS otherwise negative except as noted.   Studies Reviewed: SABRA    EKG:  EKG Interpretation Date/Time:  Wednesday November 10 2024 10:01:43 EST Ventricular Rate:  79 PR Interval:  156 QRS Duration:  90 QT Interval:  370 QTC Calculation: 424 R Axis:   -5  Text Interpretation: Normal sinus rhythm Cannot rule out Anterior infarct , age undetermined When compared with ECG of 17-Nov-2023 15:35, No significant change was found Confirmed by Bruckner Shelda [phone]) on 11/10/2024 10:30:01 AM    Physical Exam:   VS:  BP 124/68   Pulse 84   Ht 6' (1.829 m)   Wt 210 lb 8 oz (95.5 kg)   SpO2 90%   BMI 28.55 kg/m    Wt Readings from Last 3 Encounters:  11/10/24 210 lb 8 oz (95.5 kg)  05/12/24 204 lb 4.8 oz (92.7 kg)  11/17/23 211 lb 6.4 oz (95.9 kg)    GEN: Well nourished, well  developed in no acute distress HEENT: Normal, moist mucous membranes NECK: No JVD CARDIAC: regular rhythm, normal S1 and S2, no rubs or gallops. No murmur. VASCULAR: Radial and DP pulses 2+ bilaterally. No carotid bruits RESPIRATORY:  Clear to auscultation without rales, wheezing or rhonchi  ABDOMEN: Soft, non-tender, non-distended MUSCULOSKELETAL:   Ambulates independently SKIN: Warm and dry, no edema NEUROLOGIC:  Alert and oriented x 3. No focal neuro deficits noted. PSYCHIATRIC:  Normal affect    ASSESSMENT AND PLAN: .    CVA Carotid stenosis Hypercholesterolemia -monitor: no evidence of atrial fibrillation -echo: unremarkable -carotid ultrasounds: 1-39% bilateral carotid stenosis -ECG normal -completed DAPT, now on aspirin  alone -on rosuvastatin  20 mg daily, LDL 32 11/2023, has repeat    Hypertension Exercise induced hypotension -BP with intermittent lows, especially after activity. Better when he increased his hydration. No changes to meds today, but if he has worsening symptoms despite hydration, would cut back chlorthalidone  or amlodipine  first. -continue valsartan  320 mg, amlodipine  10 mg daily, chlorthalidone  25 mg daily -Would avoid retrial of bisoprolol  in the past due to bradycardia  CV risk counseling and prevention -recommend heart healthy/Mediterranean diet, with whole grains, fruits, vegetable, fish, lean meats, nuts, and olive oil. Limit salt. -recommend moderate walking, 3-5 times/week for 30-50 minutes each session. Aim for at least 150 minutes.week. Goal should be pace of 3 miles/hours, or walking 1.5 miles in 30 minutes -recommend avoidance of tobacco products. Avoid excess alcohol.  Dispo: 12 mos or sooner as needed  Signed, Shelda Bruckner, MD   Shelda Bruckner, MD, PhD, [facility]  [facility]  [place]  Heart & Vascular at [facility] at [facility] [address] [place], [place] [address] [phone]
